# Patient Record
Sex: Female | Born: 1975
Health system: Southern US, Community
[De-identification: ages and names within clinical notes are randomized; demographics above are authoritative.]

## PROBLEM LIST (undated history)

## (undated) ENCOUNTER — Inpatient Hospital Stay (HOSPITAL_COMMUNITY): Payer: Self-pay

## (undated) ENCOUNTER — Ambulatory Visit (HOSPITAL_COMMUNITY): Discharge status: Absent without leave | Payer: Medicaid Other

## (undated) DIAGNOSIS — F419 Anxiety disorder, unspecified: Secondary | ICD-10-CM

## (undated) DIAGNOSIS — R51 Headache: Secondary | ICD-10-CM

## (undated) DIAGNOSIS — R011 Cardiac murmur, unspecified: Secondary | ICD-10-CM

## (undated) DIAGNOSIS — D649 Anemia, unspecified: Secondary | ICD-10-CM

## (undated) DIAGNOSIS — F329 Major depressive disorder, single episode, unspecified: Secondary | ICD-10-CM

## (undated) DIAGNOSIS — F32A Depression, unspecified: Secondary | ICD-10-CM

## (undated) HISTORY — PX: REDUCTION MAMMAPLASTY: SUR839

---

## 2011-02-25 ENCOUNTER — Other Ambulatory Visit (HOSPITAL_COMMUNITY)
Admission: RE | Admit: 2011-02-25 | Discharge: 2011-02-25 | Disposition: A | Discharge status: Home / Self Care | Payer: BC Managed Care – PPO | Source: Ambulatory Visit | Attending: Obstetrics and Gynecology | Admitting: Obstetrics and Gynecology

## 2011-02-25 DIAGNOSIS — Z113 Encounter for screening for infections with a predominantly sexual mode of transmission: Secondary | ICD-10-CM | POA: Insufficient documentation

## 2011-02-25 DIAGNOSIS — Z124 Encounter for screening for malignant neoplasm of cervix: Secondary | ICD-10-CM | POA: Insufficient documentation

## 2011-02-25 LAB — GC/CHLAMYDIA PROBE AMP, GENITAL
Chlamydia: NEGATIVE
Gonorrhea: NEGATIVE

## 2011-02-25 LAB — HEPATITIS B SURFACE ANTIGEN: Hepatitis B Surface Ag: NEGATIVE

## 2011-02-25 LAB — ANTIBODY SCREEN: Antibody Screen: NEGATIVE

## 2011-07-07 ENCOUNTER — Encounter (HOSPITAL_COMMUNITY): Payer: Self-pay

## 2011-07-18 ENCOUNTER — Encounter (HOSPITAL_COMMUNITY)
Admission: RE | Admit: 2011-07-18 | Discharge: 2011-07-18 | Disposition: A | Discharge status: Home / Self Care | Payer: BC Managed Care – PPO | Source: Ambulatory Visit | Attending: Obstetrics and Gynecology | Admitting: Obstetrics and Gynecology

## 2011-07-18 ENCOUNTER — Inpatient Hospital Stay (HOSPITAL_COMMUNITY): Admission: RE | Admit: 2011-07-18 | Payer: BC Managed Care – PPO | Source: Ambulatory Visit

## 2011-07-18 ENCOUNTER — Encounter (HOSPITAL_COMMUNITY): Payer: Self-pay

## 2011-07-18 HISTORY — DX: Depression, unspecified: F32.A

## 2011-07-18 HISTORY — DX: Major depressive disorder, single episode, unspecified: F32.9

## 2011-07-18 HISTORY — DX: Anemia, unspecified: D64.9

## 2011-07-18 HISTORY — DX: Cardiac murmur, unspecified: R01.1

## 2011-07-18 HISTORY — DX: Headache: R51

## 2011-07-18 HISTORY — DX: Anxiety disorder, unspecified: F41.9

## 2011-07-18 LAB — CBC
HCT: 31.5 % — ABNORMAL LOW (ref 36.0–46.0)
Hemoglobin: 10.4 g/dL — ABNORMAL LOW (ref 12.0–15.0)
MCH: 28.6 pg (ref 26.0–34.0)
MCHC: 33 g/dL (ref 30.0–36.0)
MCV: 86.5 fL (ref 78.0–100.0)

## 2011-07-18 LAB — RPR: RPR Ser Ql: NONREACTIVE

## 2011-07-18 LAB — SURGICAL PCR SCREEN: MRSA, PCR: NEGATIVE

## 2011-07-18 NOTE — Patient Instructions (Signed)
YOUR PROCEDURE IS SCHEDULED ON:07/24/11  ENTER THROUGH THE MAIN ENTRANCE OF [facility] AT:1130am  USE DESK PHONE AND DIAL [phone] TO INFORM us OF YOUR ARRIVAL  CALL [phone] IF YOU HAVE ANY QUESTIONS OR PROBLEMS PRIOR TO YOUR ARRIVAL.  REMEMBER: DO NOT EAT AFTER MIDNIGHT : Wed SPECIAL INSTRUCTIONS:clear liquids ok until 9am on Thursday   YOU MAY BRUSH YOUR TEETH THE MORNING OF SURGERY   TAKE THESE MEDICINES THE DAY OF SURGERY WITH SIP OF WATER:   DO NOT WEAR JEWELRY, EYE MAKEUP, LIPSTICK OR DARK FINGERNAIL POLISH DO NOT WEAR LOTIONS  DO NOT SHAVE FOR 48 HOURS PRIOR TO SURGERY  YOU WILL NOT BE ALLOWED TO DRIVE YOURSELF HOME.  NAME OF DRIVER:George- spouse- [phone]

## 2011-07-23 ENCOUNTER — Other Ambulatory Visit (HOSPITAL_COMMUNITY): Payer: Self-pay | Admitting: Obstetrics and Gynecology

## 2011-07-24 ENCOUNTER — Inpatient Hospital Stay (HOSPITAL_COMMUNITY): Payer: BC Managed Care – PPO | Admitting: Anesthesiology

## 2011-07-24 ENCOUNTER — Encounter (HOSPITAL_COMMUNITY): Payer: Self-pay | Admitting: *Deleted

## 2011-07-24 ENCOUNTER — Inpatient Hospital Stay (HOSPITAL_COMMUNITY)
Admission: RE | Admit: 2011-07-24 | Discharge: 2011-07-26 | DRG: 371 | Disposition: A | Discharge status: Home / Self Care | Payer: BC Managed Care – PPO | Source: Ambulatory Visit | Attending: Obstetrics and Gynecology | Admitting: Obstetrics and Gynecology

## 2011-07-24 ENCOUNTER — Encounter (HOSPITAL_COMMUNITY): Payer: Self-pay | Admitting: Anesthesiology

## 2011-07-24 ENCOUNTER — Encounter (HOSPITAL_COMMUNITY)
Admission: RE | Disposition: A | Discharge status: Home / Self Care | Payer: Self-pay | Source: Ambulatory Visit | Attending: Obstetrics and Gynecology

## 2011-07-24 ENCOUNTER — Other Ambulatory Visit (HOSPITAL_COMMUNITY): Payer: Self-pay | Admitting: Obstetrics and Gynecology

## 2011-07-24 ENCOUNTER — Other Ambulatory Visit: Payer: Self-pay | Admitting: Obstetrics and Gynecology

## 2011-07-24 DIAGNOSIS — Z01812 Encounter for preprocedural laboratory examination: Secondary | ICD-10-CM

## 2011-07-24 DIAGNOSIS — O34219 Maternal care for unspecified type scar from previous cesarean delivery: Secondary | ICD-10-CM

## 2011-07-24 DIAGNOSIS — Z01818 Encounter for other preprocedural examination: Secondary | ICD-10-CM

## 2011-07-24 DIAGNOSIS — Z98891 History of uterine scar from previous surgery: Secondary | ICD-10-CM

## 2011-07-24 LAB — TYPE AND SCREEN

## 2011-07-24 SURGERY — Surgical Case
Anesthesia: Epidural | Site: Abdomen | Wound class: Clean Contaminated

## 2011-07-24 MED ORDER — ACETAMINOPHEN 10 MG/ML IV SOLN: 1000.0000 mg | Freq: Four times a day (QID) | INTRAVENOUS | Status: AC | PRN

## 2011-07-24 MED ORDER — LACTATED RINGERS IV SOLN
INTRAVENOUS | Status: DC
  Administered 2011-07-24 (×2): via INTRAVENOUS
  Administered 2011-07-24: 1000 mL via INTRAVENOUS

## 2011-07-24 MED ORDER — ONDANSETRON HCL 4 MG/2ML IJ SOLN: 4.0000 mg | INTRAMUSCULAR | Status: DC | PRN

## 2011-07-24 MED ORDER — DIPHENHYDRAMINE HCL 50 MG/ML IJ SOLN: 25.0000 mg | INTRAMUSCULAR | Status: DC | PRN

## 2011-07-24 MED ORDER — METOCLOPRAMIDE HCL 5 MG/ML IJ SOLN: 10.0000 mg | Freq: Three times a day (TID) | INTRAMUSCULAR | Status: DC | PRN

## 2011-07-24 MED ORDER — MORPHINE SULFATE (PF) 0.5 MG/ML IJ SOLN
INTRAMUSCULAR | Status: DC | PRN
  Administered 2011-07-24: 100 ug via INTRATHECAL
  Administered 2011-07-24: 2000 ug via INTRAVENOUS
  Administered 2011-07-24: 2900 ug via EPIDURAL

## 2011-07-24 MED ORDER — MORPHINE SULFATE 0.5 MG/ML IJ SOLN
INTRAMUSCULAR | Status: AC
  Filled 2011-07-24: qty 10

## 2011-07-24 MED ORDER — DIBUCAINE 1 % RE OINT: 1.0000 "application " | TOPICAL_OINTMENT | RECTAL | Status: DC | PRN

## 2011-07-24 MED ORDER — FENTANYL CITRATE 0.05 MG/ML IJ SOLN
INTRAMUSCULAR | Status: AC
  Filled 2011-07-24: qty 2

## 2011-07-24 MED ORDER — LACTATED RINGERS IV SOLN
INTRAVENOUS | Status: DC
  Administered 2011-07-25: 01:00:00 via INTRAVENOUS

## 2011-07-24 MED ORDER — SODIUM CHLORIDE 0.9 % IJ SOLN: 3.0000 mL | INTRAMUSCULAR | Status: DC | PRN

## 2011-07-24 MED ORDER — DIPHENHYDRAMINE HCL 25 MG PO CAPS: 25.0000 mg | ORAL_CAPSULE | Freq: Four times a day (QID) | ORAL | Status: DC | PRN

## 2011-07-24 MED ORDER — BUPIVACAINE IN DEXTROSE 0.75-8.25 % IT SOLN
INTRATHECAL | Status: DC | PRN
  Administered 2011-07-24: 12 mg via INTRATHECAL

## 2011-07-24 MED ORDER — MEPERIDINE HCL 25 MG/ML IJ SOLN: 6.2500 mg | INTRAMUSCULAR | Status: DC | PRN

## 2011-07-24 MED ORDER — SIMETHICONE 80 MG PO CHEW
80.0000 mg | CHEWABLE_TABLET | Freq: Three times a day (TID) | ORAL | Status: DC
  Administered 2011-07-24 – 2011-07-26 (×6): 80 mg via ORAL

## 2011-07-24 MED ORDER — KETOROLAC TROMETHAMINE 30 MG/ML IJ SOLN
INTRAMUSCULAR | Status: AC
  Administered 2011-07-24: 30 mg via INTRAVENOUS
  Filled 2011-07-24: qty 1

## 2011-07-24 MED ORDER — MEASLES, MUMPS & RUBELLA VAC ~~LOC~~ INJ: 0.5000 mL | INJECTION | Freq: Once | SUBCUTANEOUS | Status: DC

## 2011-07-24 MED ORDER — FENTANYL CITRATE 0.05 MG/ML IJ SOLN
INTRAMUSCULAR | Status: DC | PRN
  Administered 2011-07-24: 100 ug via INTRAVENOUS
  Administered 2011-07-24: 75 ug via INTRAVENOUS
  Administered 2011-07-24: 25 ug via INTRATHECAL

## 2011-07-24 MED ORDER — ONDANSETRON HCL 4 MG/2ML IJ SOLN
INTRAMUSCULAR | Status: AC
  Filled 2011-07-24: qty 2

## 2011-07-24 MED ORDER — MENTHOL 3 MG MT LOZG: 1.0000 | LOZENGE | OROMUCOSAL | Status: DC | PRN

## 2011-07-24 MED ORDER — DIPHENHYDRAMINE HCL 25 MG PO CAPS: 25.0000 mg | ORAL_CAPSULE | ORAL | Status: DC | PRN

## 2011-07-24 MED ORDER — KETOROLAC TROMETHAMINE 60 MG/2ML IM SOLN
INTRAMUSCULAR | Status: AC
  Filled 2011-07-24: qty 2

## 2011-07-24 MED ORDER — IBUPROFEN 600 MG PO TABS
600.0000 mg | ORAL_TABLET | Freq: Four times a day (QID) | ORAL | Status: DC
  Administered 2011-07-25 – 2011-07-26 (×4): 600 mg via ORAL
  Filled 2011-07-24 (×2): qty 1

## 2011-07-24 MED ORDER — PROMETHAZINE HCL 25 MG/ML IJ SOLN: 6.2500 mg | INTRAMUSCULAR | Status: DC | PRN

## 2011-07-24 MED ORDER — ONDANSETRON HCL 4 MG PO TABS: 4.0000 mg | ORAL_TABLET | ORAL | Status: DC | PRN

## 2011-07-24 MED ORDER — HYDROMORPHONE HCL PF 1 MG/ML IJ SOLN
INTRAMUSCULAR | Status: AC
  Filled 2011-07-24: qty 1

## 2011-07-24 MED ORDER — SCOPOLAMINE 1 MG/3DAYS TD PT72
1.0000 | MEDICATED_PATCH | Freq: Once | TRANSDERMAL | Status: DC
  Administered 2011-07-24: 1.5 mg via TRANSDERMAL

## 2011-07-24 MED ORDER — NALBUPHINE HCL 10 MG/ML IJ SOLN: 5.0000 mg | INTRAMUSCULAR | Status: DC | PRN

## 2011-07-24 MED ORDER — OXYTOCIN 20 UNITS IN LACTATED RINGERS INFUSION - SIMPLE
125.0000 mL/h | INTRAVENOUS | Status: AC
  Administered 2011-07-24: 125 mL/h via INTRAVENOUS

## 2011-07-24 MED ORDER — DIPHENHYDRAMINE HCL 50 MG/ML IJ SOLN: 12.5000 mg | INTRAMUSCULAR | Status: DC | PRN

## 2011-07-24 MED ORDER — OXYTOCIN 20 UNITS IN LACTATED RINGERS INFUSION - SIMPLE
INTRAVENOUS | Status: DC | PRN
  Administered 2011-07-24: 20 [IU] via INTRAVENOUS

## 2011-07-24 MED ORDER — HYDROMORPHONE HCL PF 1 MG/ML IJ SOLN
INTRAMUSCULAR | Status: DC | PRN
  Administered 2011-07-24 (×2): 1 mg via INTRAVENOUS

## 2011-07-24 MED ORDER — HEMOSTATIC AGENTS (NO CHARGE) OPTIME
TOPICAL | Status: DC | PRN
  Administered 2011-07-24: 1 via TOPICAL

## 2011-07-24 MED ORDER — CHLOROPROCAINE HCL 3 % IJ SOLN
INTRAMUSCULAR | Status: AC
  Filled 2011-07-24: qty 20

## 2011-07-24 MED ORDER — IBUPROFEN 600 MG PO TABS
600.0000 mg | ORAL_TABLET | Freq: Four times a day (QID) | ORAL | Status: DC | PRN
  Administered 2011-07-24 – 2011-07-25 (×3): 600 mg via ORAL
  Filled 2011-07-24 (×5): qty 1

## 2011-07-24 MED ORDER — EPHEDRINE SULFATE 50 MG/ML IJ SOLN
INTRAMUSCULAR | Status: DC | PRN
  Administered 2011-07-24 (×2): 15 mg via INTRAVENOUS

## 2011-07-24 MED ORDER — BUPIVACAINE HCL (PF) 0.25 % IJ SOLN
INTRAMUSCULAR | Status: DC | PRN
  Administered 2011-07-24: 16 mL

## 2011-07-24 MED ORDER — BUPIVACAINE ON-Q PAIN PUMP (FOR ORDER SET NO CHG)
INJECTION | Status: DC
  Filled 2011-07-24: qty 1

## 2011-07-24 MED ORDER — ONDANSETRON HCL 4 MG/2ML IJ SOLN: 4.0000 mg | Freq: Three times a day (TID) | INTRAMUSCULAR | Status: DC | PRN

## 2011-07-24 MED ORDER — SCOPOLAMINE 1 MG/3DAYS TD PT72: 1.0000 | MEDICATED_PATCH | Freq: Once | TRANSDERMAL | Status: DC

## 2011-07-24 MED ORDER — PRENATAL MULTIVITAMIN CH
1.0000 | ORAL_TABLET | Freq: Every day | ORAL | Status: DC
  Administered 2011-07-25 – 2011-07-26 (×2): 1 via ORAL
  Filled 2011-07-24 (×2): qty 1

## 2011-07-24 MED ORDER — OXYTOCIN 10 UNIT/ML IJ SOLN
INTRAMUSCULAR | Status: AC
  Filled 2011-07-24: qty 2

## 2011-07-24 MED ORDER — LANOLIN HYDROUS EX OINT: 1.0000 "application " | TOPICAL_OINTMENT | CUTANEOUS | Status: DC | PRN

## 2011-07-24 MED ORDER — FENTANYL CITRATE 0.05 MG/ML IJ SOLN: 25.0000 ug | INTRAMUSCULAR | Status: DC | PRN

## 2011-07-24 MED ORDER — ONDANSETRON HCL 4 MG/2ML IJ SOLN
INTRAMUSCULAR | Status: DC | PRN
  Administered 2011-07-24: 4 mg via INTRAVENOUS

## 2011-07-24 MED ORDER — KETOROLAC TROMETHAMINE 30 MG/ML IJ SOLN
30.0000 mg | Freq: Four times a day (QID) | INTRAMUSCULAR | Status: AC | PRN
  Administered 2011-07-24: 30 mg via INTRAVENOUS

## 2011-07-24 MED ORDER — ACETAMINOPHEN 325 MG PO TABS: 325.0000 mg | ORAL_TABLET | ORAL | Status: DC | PRN

## 2011-07-24 MED ORDER — NALOXONE HCL 0.4 MG/ML IJ SOLN: 1.0000 ug/kg/h | INTRAMUSCULAR | Status: DC | PRN

## 2011-07-24 MED ORDER — LACTATED RINGERS IV SOLN
INTRAVENOUS | Status: DC | PRN
  Administered 2011-07-24: 13:00:00 via INTRAVENOUS

## 2011-07-24 MED ORDER — MEDROXYPROGESTERONE ACETATE 150 MG/ML IM SUSP: 150.0000 mg | INTRAMUSCULAR | Status: DC | PRN

## 2011-07-24 MED ORDER — NALOXONE HCL 0.4 MG/ML IJ SOLN: 0.4000 mg | INTRAMUSCULAR | Status: DC | PRN

## 2011-07-24 MED ORDER — SIMETHICONE 80 MG PO CHEW: 80.0000 mg | CHEWABLE_TABLET | ORAL | Status: DC | PRN

## 2011-07-24 MED ORDER — CEFAZOLIN SODIUM 1-5 GM-% IV SOLN
1.0000 g | INTRAVENOUS | Status: AC
  Administered 2011-07-24: 1 g via INTRAVENOUS

## 2011-07-24 MED ORDER — TETANUS-DIPHTH-ACELL PERTUSSIS 5-2.5-18.5 LF-MCG/0.5 IM SUSP: 0.5000 mL | Freq: Once | INTRAMUSCULAR | Status: DC

## 2011-07-24 MED ORDER — ZOLPIDEM TARTRATE 5 MG PO TABS: 5.0000 mg | ORAL_TABLET | Freq: Every evening | ORAL | Status: DC | PRN

## 2011-07-24 MED ORDER — KETOROLAC TROMETHAMINE 30 MG/ML IJ SOLN: 30.0000 mg | Freq: Four times a day (QID) | INTRAMUSCULAR | Status: AC | PRN

## 2011-07-24 MED ORDER — SENNOSIDES-DOCUSATE SODIUM 8.6-50 MG PO TABS
2.0000 | ORAL_TABLET | Freq: Every day | ORAL | Status: DC
  Administered 2011-07-24 – 2011-07-25 (×2): 2 via ORAL

## 2011-07-24 MED ORDER — OXYCODONE-ACETAMINOPHEN 5-325 MG PO TABS
1.0000 | ORAL_TABLET | ORAL | Status: DC | PRN
  Administered 2011-07-25 – 2011-07-26 (×7): 2 via ORAL
  Filled 2011-07-24 (×7): qty 2

## 2011-07-24 MED ORDER — PHENYLEPHRINE HCL 10 MG/ML IJ SOLN
INTRAMUSCULAR | Status: DC | PRN
  Administered 2011-07-24: 120 ug via INTRAVENOUS

## 2011-07-24 MED ORDER — OXYTOCIN 20 UNITS IN LACTATED RINGERS INFUSION - SIMPLE
INTRAVENOUS | Status: AC
  Administered 2011-07-24: 125 mL/h via INTRAVENOUS
  Filled 2011-07-24: qty 1000

## 2011-07-24 MED ORDER — WITCH HAZEL-GLYCERIN EX PADS: 1.0000 "application " | MEDICATED_PAD | CUTANEOUS | Status: DC | PRN

## 2011-07-24 SURGICAL SUPPLY — 41 items
BENZOIN TINCTURE PRP APPL 2/3 (GAUZE/BANDAGES/DRESSINGS) ×2 IMPLANT
CLOSURE STERI STRIP 1/2 X4 (GAUZE/BANDAGES/DRESSINGS) ×2 IMPLANT
CLOTH BEACON ORANGE TIMEOUT ST (SAFETY) ×2 IMPLANT
DERMABOND ADVANCED (GAUZE/BANDAGES/DRESSINGS) ×1
DERMABOND ADVANCED .7 DNX12 (GAUZE/BANDAGES/DRESSINGS) ×1 IMPLANT
DRAIN JACKSON PRT FLT 10 (DRAIN) IMPLANT
DRESSING TELFA 8X3 (GAUZE/BANDAGES/DRESSINGS) ×2 IMPLANT
DURAPREP 26ML APPLICATOR (WOUND CARE) IMPLANT
ELECT REM PT RETURN 9FT ADLT (ELECTROSURGICAL) ×2
ELECTRODE REM PT RTRN 9FT ADLT (ELECTROSURGICAL) ×1 IMPLANT
EVACUATOR SILICONE 100CC (DRAIN) IMPLANT
EXTRACTOR VACUUM M CUP 4 TUBE (SUCTIONS) ×6 IMPLANT
GAUZE SPONGE 4X4 12PLY STRL LF (GAUZE/BANDAGES/DRESSINGS) ×2 IMPLANT
GLOVE BIO SURGEON STRL SZ 6.5 (GLOVE) ×2 IMPLANT
GLOVE BIOGEL PI IND STRL 7.0 (GLOVE) ×1 IMPLANT
GLOVE BIOGEL PI INDICATOR 7.0 (GLOVE) ×1
GOWN PREVENTION PLUS LG XLONG (DISPOSABLE) ×4 IMPLANT
GOWN PREVENTION PLUS XLARGE (GOWN DISPOSABLE) ×2 IMPLANT
KIT ABG SYR 3ML LUER SLIP (SYRINGE) IMPLANT
NEEDLE HYPO 25X5/8 SAFETYGLIDE (NEEDLE) IMPLANT
NS IRRIG 1000ML POUR BTL (IV SOLUTION) ×2 IMPLANT
PACK C SECTION WH (CUSTOM PROCEDURE TRAY) ×2 IMPLANT
PAD ABD 7.5X8 STRL (GAUZE/BANDAGES/DRESSINGS) ×2 IMPLANT
RTRCTR C-SECT PINK 25CM LRG (MISCELLANEOUS) IMPLANT
SLEEVE SCD COMPRESS KNEE MED (MISCELLANEOUS) IMPLANT
SPONGE LAP 18X18 X RAY DECT (DISPOSABLE) ×4 IMPLANT
STRIP CLOSURE SKIN 1/2X4 (GAUZE/BANDAGES/DRESSINGS) IMPLANT
SUT CHROMIC 2 0 CT 1 (SUTURE) ×4 IMPLANT
SUT PLAIN 2 0 (SUTURE)
SUT PLAIN 2 0 XLH (SUTURE) ×2 IMPLANT
SUT PLAIN ABS 2-0 54XMFL TIE (SUTURE) IMPLANT
SUT VIC AB 0 CT1 36 (SUTURE) ×14 IMPLANT
SUT VIC AB 0 CTX 36 (SUTURE) ×1
SUT VIC AB 0 CTX36XBRD ANBCTRL (SUTURE) ×1 IMPLANT
SUT VIC AB 3-0 SH 27 (SUTURE) ×1
SUT VIC AB 3-0 SH 27X BRD (SUTURE) ×1 IMPLANT
SUT VIC AB 4-0 KS 27 (SUTURE) ×2 IMPLANT
SYR 20CC LL (SYRINGE) ×2 IMPLANT
TOWEL OR 17X24 6PK STRL BLUE (TOWEL DISPOSABLE) ×4 IMPLANT
TRAY FOLEY CATH 14FR (SET/KITS/TRAYS/PACK) ×2 IMPLANT
WATER STERILE IRR 1000ML POUR (IV SOLUTION) ×2 IMPLANT

## 2011-07-24 NOTE — H&P (Signed)
Gail Brewer is an 36 y.o. female G3P2 who is due date is prenatal course has been uncomplicated and the patient presents at term for repeat C-section. She is previous C-section x2. First C-section was in 2004 for fetal distress. Then in 2008 a repeat C-section was done following a failed VBAC. Risks possible complications of the procedure has been discussed and consent was given for repeat C-section..  Pertinent Gynecological History: Menses: LMP was 10/20/2010 Bleeding: na DES exposure: denies Blood transfusions: none Sexually transmitted diseases: past history: chlamydia Previous GYN Procedures: cesarean section x2  Last pap: normal Date: 02/25/2011 OB History: G3, P2   Menstrual History: Menarche age:40 Patient's last menstrual period was 10/20/2010.    Past Medical History  Diagnosis Date  . Heart murmur     asymptomatic  . Anemia   . Anxiety   . Depression   . Headache     Past Surgical History  Procedure Date  . Cesarean section     x 2    No family history on file.  Social History:  reports that she has been smoking.  She does not have any smokeless tobacco history on file. She reports that she does not drink alcohol or use illicit drugs.  Allergies: No Known Allergies    Review of Systems  Constitutional: Negative.   HENT: Negative.   Eyes: Negative.   Respiratory: Negative.   Cardiovascular: Negative.   Gastrointestinal: Negative.   Genitourinary: Negative.   Musculoskeletal: Negative.   Skin: Negative.   Neurological: Negative.   Endo/Heme/Allergies: Negative.   Psychiatric/Behavioral: Negative.     Last menstrual period 10/20/2010. Physical Exam  Constitutional: She is oriented to person, place, and time. She appears well-developed and well-nourished.  HENT:  Head: Normocephalic and atraumatic.  Eyes: Conjunctivae and EOM are normal. Right eye exhibits no discharge. Left eye exhibits no discharge.  Neck: Normal range of motion. Neck  supple. No JVD present. No tracheal deviation present. No thyromegaly present.  Cardiovascular: Normal rate, regular rhythm, normal heart sounds and intact distal pulses.  Exam reveals no gallop and no friction rub.   No murmur heard. Respiratory: Effort normal and breath sounds normal. No stridor.  GI: Soft. Bowel sounds are normal.  Genitourinary: Vagina normal. No vaginal discharge found.       Term uterus  Musculoskeletal: Normal range of motion. She exhibits no edema and no tenderness.  Lymphadenopathy:    She has no cervical adenopathy.  Neurological: She is alert and oriented to person, place, and time. She exhibits normal muscle tone. Coordination normal.  Skin: Skin is warm and dry. No rash noted. No erythema. No pallor.  Psychiatric: She has a normal mood and affect. Her behavior is normal. Judgment and thought content normal.    No results found for this or any previous visit (from the past 24 hour(s)).  No results found.  Assessment/Plan: 36 year old gravida 3 para 2 patient whose due date is 07/27/2011 for repeat C-section Plan repeat cesarean section  Mazie Fencl E 07/24/2011, 9:21 AM

## 2011-07-24 NOTE — Anesthesia Preprocedure Evaluation (Signed)
Anesthesia Evaluation  Patient identified by MRN, date of birth, ID band Patient awake    Reviewed: Allergy & Precautions, H&P , Patient's Chart, lab work & pertinent test results  Airway Mallampati: III TM Distance: >3 FB Neck ROM: full    Dental No notable dental hx.    Pulmonary neg pulmonary ROS,  clear to auscultation  Pulmonary exam normal       Cardiovascular neg cardio ROS + Valvular Problems/Murmurs regular Normal    Neuro/Psych  Headaches, Negative Neurological ROS  Negative Psych ROS   GI/Hepatic negative GI ROS, Neg liver ROS,   Endo/Other  Negative Endocrine ROSMorbid obesity  Renal/GU negative Renal ROS     Musculoskeletal   Abdominal   Peds  Hematology negative hematology ROS (+)   Anesthesia Other Findings   Reproductive/Obstetrics (+) Pregnancy                           Anesthesia Physical Anesthesia Plan  ASA: III  Anesthesia Plan: Epidural   Post-op Pain Management:    Induction:   Airway Management Planned:   Additional Equipment:   Intra-op Plan:   Post-operative Plan:   Informed Consent: I have reviewed the patients History and Physical, chart, labs and discussed the procedure including the risks, benefits and alternatives for the proposed anesthesia with the patient or authorized representative who has indicated his/her understanding and acceptance.     Plan Discussed with:   Anesthesia Plan Comments:         Anesthesia Quick Evaluation

## 2011-07-24 NOTE — Anesthesia Procedure Notes (Signed)
Spinal  Patient location during procedure: OR Start time: 07/24/2011 12:25 PM Staffing Anesthesiologist: Brayton Caves R Performed by: anesthesiologist  Preanesthetic Checklist Completed: patient identified, site marked, surgical consent, pre-op evaluation, timeout performed, IV checked, risks and benefits discussed and monitors and equipment checked Spinal Block Patient position: sitting Prep: DuraPrep Patient monitoring: heart rate, cardiac monitor, continuous pulse ox and blood pressure Approach: midline Location: L3-4 Injection technique: single-shot Needle Needle type: Sprotte  Needle gauge: 24 G Needle length: 9 cm Assessment Sensory level: T4 Additional Notes Patient identified.  Risk benefits discussed including failed block, incomplete pain control, headache, nerve damage, paralysis, blood pressure changes, nausea, vomiting, reactions to medication both toxic or allergic, and postpartum back pain.  Patient expressed understanding and wished to proceed.  All questions were answered.  Sterile technique used throughout procedure.  CSF was clear.  No parasthesia or other complications.  Please see nursing notes for vital signs.

## 2011-07-24 NOTE — Op Note (Addendum)
Cesarean Section Procedure Note   Gail Brewer   07/24/2011  Indications:Them patient is a 36 yo G3 P2 with a term pregnancy and previous CS x 2.    Pre-operative Diagnosis: Previous  Cesarean Section.   Post-operative Diagnosis: Same   Surgeon: Fortino Sic  Assistants: Dr. Emelda Fear  Anesthesia: spinal and local  Procedure Details:  The patient was seen in the Holding Room. The risks, benefits, complications, treatment options, and expected outcomes were discussed with the patient. The patient concurred with the proposed plan, giving informed consent. The patient was identified as Gail Brewer and the procedure verified as C-Section Delivery. A Time Out was held and the above information confirmed.  After induction of anesthesia, the patient was draped and prepped in the usual sterile manner. A transverse incision was made and carried down through the subcutaneous tissue to the fascia. The fascial incision was made and extended transversely. The fascia was separated from the underlying rectus tissue superiorly and inferiorly. The peritoneum was not identified.There was thick midline scaring of the uterus to the abdominal wall.  A low transverse uterine incision was made with difficulty due to thick midline adhesions.  The incision was tight so the rectus muscles were incised using cautery on the coagulation setting.  Then using the suction cup was applied and on the third attempt the fetal head was delivered from cephalic presentation.  The infant was a  7 lb, 8 oz female with Apgars of 9 and 9. A cord ph was not obtained.  The umbilical cord was clamped and cut cord. The placenta was removed Intact and appeared normal.  The uterine incision was closed with running locked sutures of 1-0 Vicryl. A second imbricating layer of the same suture was placed.  Hemostasis was observed.  Surgicel was placed beneath the rectus muscle on the rightafter hemostasis was obtained using interrupted  0 vicryl sutures  Due to extensive adhesions, there were only  two small areas where the peritoneum was entered, one 3 to 4 cm area beneath the rectus muscle and a small area about 2 cm in the lower left abdomen. So essentially an a extra faschial Cesarean section was done. The incision was irrigated and suctioned.  At this point hemostasis was excellent. Several loose sutures were placed in the superior rectus muscles to re approximate the muscle. The fascia was then reapproximated with running sutures of 0-0Vicryl. The subcuticular closure was performed using 2 0 plain catgut. The skin was approximated with 4 0 vicryl in a subcuticular fashion.   Instrument, sponge, and needle counts were correct prior the abdominal closure and were correct at the conclusion of the case.    Findings: Severe scar tissue.  For next CS consider and midline uterine incision.   Estimated Blood Loss: 1000 cc  Total IV Fluids: See anesthesia record.   Urine Output: see anesthesia record.  Specimens: @ORSPECIMEN @   Complications: no complications  Disposition: PACU - hemodynamically stable.  Maternal Condition: stable   Baby condition / location:  nursery-stable    Signed: Surgeon(s): Fortino Sic, MD Tilda Burrow, MD

## 2011-07-24 NOTE — Anesthesia Postprocedure Evaluation (Signed)
Anesthesia Post Note  Patient: Gail Brewer  Procedure(s) Performed:  CESAREAN SECTION - Repeat Cesarian Section  Anesthesia type: Spinal  Patient location: PACU  Post pain: Pain level controlled  Post assessment: Post-op Vital signs reviewed  Last Vitals:  Filed Vitals:   07/24/11 1445  BP: 106/51  Pulse: 76  Temp:   Resp: 20    Post vital signs: Reviewed  Level of consciousness: awake  Complications: No apparent anesthesia complications

## 2011-07-24 NOTE — OR Nursing (Signed)
Pt. C/o pain @ incision. Dr. Emelda Fear poured 3% nesacaine into wound topically @ sterile field @ 1315.

## 2011-07-24 NOTE — Transfer of Care (Signed)
Immediate Anesthesia Transfer of Care Note  Patient: Gail Brewer  Procedure(s) Performed:  CESAREAN SECTION - Repeat Cesarian Section  Patient Location: PACU  Anesthesia Type: Spinal  Level of Consciousness: awake, oriented and sedated  Airway & Oxygen Therapy: Patient Spontanous Breathing  Post-op Assessment: Report given to PACU RN and Post -op Vital signs reviewed and stable  Post vital signs: Reviewed and stable  Complications: No apparent anesthesia complications

## 2011-07-25 LAB — CBC
Platelets: 190 10*3/uL (ref 150–400)
RBC: 3.08 MIL/uL — ABNORMAL LOW (ref 3.87–5.11)
RDW: 15.1 % (ref 11.5–15.5)
WBC: 12.6 10*3/uL — ABNORMAL HIGH (ref 4.0–10.5)

## 2011-07-25 NOTE — Progress Notes (Signed)
Patient was referred for history of depression/anxiety. * Referral screened out by Clinical Social Worker because none of the following criteria appear to apply: ~ History of anxiety/depression during this pregnancy, or of post-partum depression. ~ Diagnosis of anxiety and/or depression within last 3 years ~ History of depression due to pregnancy loss/loss of child OR * Patient's symptoms currently being treated with medication and/or therapy.  Patient's prenatal record clearly states that the Depression was "in the past."  Please contact the Clinical Social Worker if needs arise, or if patient requests. 

## 2011-07-25 NOTE — Progress Notes (Signed)
Orthostatics not obtained on pt. Found pt. dressed in her own clothes & ambulating in room.

## 2011-07-26 MED ORDER — IBUPROFEN 600 MG PO TABS: 600.0000 mg | ORAL_TABLET | Freq: Four times a day (QID) | ORAL | Status: AC | PRN

## 2011-07-26 MED ORDER — OXYCODONE-ACETAMINOPHEN 5-325 MG PO TABS: 1.0000 | ORAL_TABLET | Freq: Four times a day (QID) | ORAL | Status: AC | PRN

## 2011-07-26 NOTE — Progress Notes (Signed)
Subjective: Postpartum Day 1: Cesarean Delivery Patient reports tolerating PO and no problems voiding.    Objective: Vital signs in last 24 hours: Temp:  [97.8 F (36.6 C)-98.1 F (36.7 C)] 97.8 F (36.6 C) (01/04 2105) Pulse Rate:  [86-93] 93  (01/04 2105) Resp:  [18-20] 18  (01/04 2105) BP: (103-114)/(66-78) 104/70 mmHg (01/04 2105)  Physical Exam:  General: alert, cooperative and no distress Lungs clear S1 and S cleard Lochia: appropriate Uterine Fundus: firm BS present.  No flatus, bowel movement yet Incision: bandage dry.  Plan removal tomorrow. DVT Evaluation: No evidence of DVT seen on physical exam.   Basename 07/25/11 0535  HGB 8.9*  HCT 26.6*    Assessment/Plan: Status post Cesarean section. Doing well postoperatively. Post op anemia   P:  Iron  Continue current care.  Gail Brewer E 07/26/2011, 4:57 AM

## 2011-07-26 NOTE — Discharge Summary (Signed)
Obstetric Discharge Summary Reason for Admission: cesarean section Prenatal Procedures: none Intrapartum Procedures: cesarean: low cervical, transverse Postpartum Procedures: none Complications-Operative and Postpartum: none Hemoglobin  Date Value Range Status  07/25/2011 8.9* 12.0-15.0 (g/dL) Final     HCT  Date Value Range Status  07/25/2011 26.6* 36.0-46.0 (%) Final    Discharge Diagnoses: Term Pregnancy-delivered  Discharge Information: Date: 07/26/2011 Activity: pelvic rest Diet: routine Medications: PNV, Ibuprofen, Iron and Percocet Condition: stable Instructions: refer to practice specific booklet Discharge to: home Follow-up Information    Follow up with Fortino Sic, MD. (pt has an appt for follow up in january )    Contact information:   [address] [place] [address] [phone]          Newborn Data: Live born female  Birth Weight: 7 lb 9.2 oz (3435 g) APGAR: 8, 9  Home with mother.  Gail Rosencrans J. 07/26/2011, 2:10 PM

## 2011-07-26 NOTE — Progress Notes (Signed)
Post Partum Day 2 Subjective: no complaints, up ad lib, voiding, tolerating PO and + flatus Desires discharge home today   Objective: Blood pressure 105/64, pulse 92, temperature 97.8 F (36.6 C), temperature source Oral, resp. rate 18, weight 108.863 kg (240 lb), last menstrual period 10/20/2010, SpO2 96.00%, unknown if currently breastfeeding.  Physical Exam:  General: alert and cooperative Lochia: appropriate Uterine Fundus: firm Incision: healing well DVT Evaluation: No evidence of DVT seen on physical exam.   Basename 07/25/11 0535  HGB 8.9*  HCT 26.6*    Assessment/Plan: Discharge home   LOS: 2 days   Akeisha Lagerquist J. 07/26/2011, 2:05 PM

## 2011-07-28 ENCOUNTER — Encounter (HOSPITAL_COMMUNITY): Payer: Self-pay | Admitting: Obstetrics and Gynecology

## 2013-03-28 ENCOUNTER — Other Ambulatory Visit: Payer: Self-pay | Admitting: Obstetrics and Gynecology

## 2013-03-28 ENCOUNTER — Other Ambulatory Visit (HOSPITAL_COMMUNITY)
Admission: RE | Admit: 2013-03-28 | Discharge: 2013-03-28 | Disposition: A | Discharge status: Home / Self Care | Payer: 59 | Source: Ambulatory Visit | Attending: Obstetrics and Gynecology | Admitting: Obstetrics and Gynecology

## 2013-03-28 DIAGNOSIS — Z1151 Encounter for screening for human papillomavirus (HPV): Secondary | ICD-10-CM | POA: Insufficient documentation

## 2013-03-28 DIAGNOSIS — Z01419 Encounter for gynecological examination (general) (routine) without abnormal findings: Secondary | ICD-10-CM | POA: Insufficient documentation

## 2013-10-06 ENCOUNTER — Encounter (HOSPITAL_COMMUNITY): Payer: Self-pay | Admitting: Emergency Medicine

## 2013-10-06 ENCOUNTER — Emergency Department (HOSPITAL_COMMUNITY)
Admission: EM | Admit: 2013-10-06 | Discharge: 2013-10-06 | Disposition: A | Discharge status: Home / Self Care | Payer: 59 | Attending: Emergency Medicine | Admitting: Emergency Medicine

## 2013-10-06 DIAGNOSIS — IMO0002 Reserved for concepts with insufficient information to code with codable children: Secondary | ICD-10-CM | POA: Insufficient documentation

## 2013-10-06 DIAGNOSIS — Z8659 Personal history of other mental and behavioral disorders: Secondary | ICD-10-CM | POA: Insufficient documentation

## 2013-10-06 DIAGNOSIS — R011 Cardiac murmur, unspecified: Secondary | ICD-10-CM | POA: Insufficient documentation

## 2013-10-06 DIAGNOSIS — M542 Cervicalgia: Secondary | ICD-10-CM

## 2013-10-06 DIAGNOSIS — S0993XA Unspecified injury of face, initial encounter: Secondary | ICD-10-CM | POA: Insufficient documentation

## 2013-10-06 DIAGNOSIS — M549 Dorsalgia, unspecified: Secondary | ICD-10-CM

## 2013-10-06 DIAGNOSIS — F17200 Nicotine dependence, unspecified, uncomplicated: Secondary | ICD-10-CM | POA: Insufficient documentation

## 2013-10-06 DIAGNOSIS — Z862 Personal history of diseases of the blood and blood-forming organs and certain disorders involving the immune mechanism: Secondary | ICD-10-CM | POA: Insufficient documentation

## 2013-10-06 DIAGNOSIS — Y92410 Unspecified street and highway as the place of occurrence of the external cause: Secondary | ICD-10-CM | POA: Insufficient documentation

## 2013-10-06 DIAGNOSIS — S199XXA Unspecified injury of neck, initial encounter: Principal | ICD-10-CM

## 2013-10-06 DIAGNOSIS — Y9389 Activity, other specified: Secondary | ICD-10-CM | POA: Insufficient documentation

## 2013-10-06 MED ORDER — CYCLOBENZAPRINE HCL 10 MG PO TABS: 10.0000 mg | ORAL_TABLET | Freq: Two times a day (BID) | ORAL | Status: DC | PRN

## 2013-10-06 MED ORDER — IBUPROFEN 800 MG PO TABS: 800.0000 mg | ORAL_TABLET | Freq: Three times a day (TID) | ORAL | Status: DC
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
## 2013-10-06 NOTE — Discharge Instructions (Signed)
Take ibuprofen as needed for pain. Take Flexeril as needed for muscle spasm. Refer to attached documents for more information.  °
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
## 2013-10-07 NOTE — ED Provider Notes (Signed)
Medical screening examination/treatment/procedure(s) were performed by non-physician practitioner and as supervising physician I was immediately available for consultation/collaboration.   Marcelline Temkin R Nneoma Harral, MD 10/07/13 0711 

## 2014-05-22 ENCOUNTER — Encounter (HOSPITAL_COMMUNITY): Payer: Self-pay | Admitting: Emergency Medicine

## 2014-09-18 ENCOUNTER — Emergency Department (HOSPITAL_COMMUNITY): Payer: No Typology Code available for payment source

## 2014-09-18 ENCOUNTER — Emergency Department (HOSPITAL_COMMUNITY)
Admission: EM | Admit: 2014-09-18 | Discharge: 2014-09-19 | Disposition: A | Discharge status: Home / Self Care | Payer: No Typology Code available for payment source | Source: Home / Self Care | Attending: Emergency Medicine | Admitting: Emergency Medicine

## 2014-09-18 ENCOUNTER — Emergency Department (HOSPITAL_COMMUNITY)
Admission: EM | Admit: 2014-09-18 | Discharge: 2014-09-18 | Discharge status: Against Medical Advice | Payer: No Typology Code available for payment source | Attending: Emergency Medicine | Admitting: Emergency Medicine

## 2014-09-18 ENCOUNTER — Encounter (HOSPITAL_COMMUNITY): Payer: Self-pay

## 2014-09-18 ENCOUNTER — Encounter (HOSPITAL_COMMUNITY): Payer: Self-pay | Admitting: *Deleted

## 2014-09-18 DIAGNOSIS — O9989 Other specified diseases and conditions complicating pregnancy, childbirth and the puerperium: Secondary | ICD-10-CM

## 2014-09-18 DIAGNOSIS — Y998 Other external cause status: Secondary | ICD-10-CM | POA: Insufficient documentation

## 2014-09-18 DIAGNOSIS — O9A211 Injury, poisoning and certain other consequences of external causes complicating pregnancy, first trimester: Secondary | ICD-10-CM | POA: Diagnosis present

## 2014-09-18 DIAGNOSIS — O209 Hemorrhage in early pregnancy, unspecified: Secondary | ICD-10-CM | POA: Insufficient documentation

## 2014-09-18 DIAGNOSIS — Z3A08 8 weeks gestation of pregnancy: Secondary | ICD-10-CM | POA: Insufficient documentation

## 2014-09-18 DIAGNOSIS — O23591 Infection of other part of genital tract in pregnancy, first trimester: Secondary | ICD-10-CM

## 2014-09-18 DIAGNOSIS — F17200 Nicotine dependence, unspecified, uncomplicated: Secondary | ICD-10-CM | POA: Insufficient documentation

## 2014-09-18 DIAGNOSIS — O99331 Smoking (tobacco) complicating pregnancy, first trimester: Secondary | ICD-10-CM | POA: Insufficient documentation

## 2014-09-18 DIAGNOSIS — O99011 Anemia complicating pregnancy, first trimester: Secondary | ICD-10-CM

## 2014-09-18 DIAGNOSIS — S199XXA Unspecified injury of neck, initial encounter: Secondary | ICD-10-CM | POA: Insufficient documentation

## 2014-09-18 DIAGNOSIS — D649 Anemia, unspecified: Secondary | ICD-10-CM | POA: Insufficient documentation

## 2014-09-18 DIAGNOSIS — Z862 Personal history of diseases of the blood and blood-forming organs and certain disorders involving the immune mechanism: Secondary | ICD-10-CM | POA: Insufficient documentation

## 2014-09-18 DIAGNOSIS — S3992XA Unspecified injury of lower back, initial encounter: Secondary | ICD-10-CM | POA: Insufficient documentation

## 2014-09-18 DIAGNOSIS — Y9389 Activity, other specified: Secondary | ICD-10-CM

## 2014-09-18 DIAGNOSIS — Z79899 Other long term (current) drug therapy: Secondary | ICD-10-CM | POA: Insufficient documentation

## 2014-09-18 DIAGNOSIS — R011 Cardiac murmur, unspecified: Secondary | ICD-10-CM

## 2014-09-18 DIAGNOSIS — F17210 Nicotine dependence, cigarettes, uncomplicated: Secondary | ICD-10-CM | POA: Diagnosis not present

## 2014-09-18 DIAGNOSIS — Z3490 Encounter for supervision of normal pregnancy, unspecified, unspecified trimester: Secondary | ICD-10-CM

## 2014-09-18 DIAGNOSIS — N760 Acute vaginitis: Principal | ICD-10-CM

## 2014-09-18 DIAGNOSIS — Z8659 Personal history of other mental and behavioral disorders: Secondary | ICD-10-CM | POA: Insufficient documentation

## 2014-09-18 DIAGNOSIS — Z3A01 Less than 8 weeks gestation of pregnancy: Secondary | ICD-10-CM | POA: Insufficient documentation

## 2014-09-18 DIAGNOSIS — Y92410 Unspecified street and highway as the place of occurrence of the external cause: Secondary | ICD-10-CM

## 2014-09-18 DIAGNOSIS — N939 Abnormal uterine and vaginal bleeding, unspecified: Secondary | ICD-10-CM

## 2014-09-18 DIAGNOSIS — B9689 Other specified bacterial agents as the cause of diseases classified elsewhere: Secondary | ICD-10-CM

## 2014-09-18 LAB — ABO/RH: ABO/RH(D): O POS

## 2014-09-18 LAB — I-STAT BETA HCG BLOOD, ED (MC, WL, AP ONLY): I-stat hCG, quantitative: >2000 m[IU]/mL — ABNORMAL HIGH (ref <5)

## 2014-09-18 LAB — URINALYSIS, ROUTINE W REFLEX MICROSCOPIC
Bilirubin Urine: NEGATIVE
Glucose, UA: NEGATIVE mg/dL
Hgb urine dipstick: NEGATIVE
KETONES UR: NEGATIVE mg/dL
Leukocytes, UA: NEGATIVE
NITRITE: NEGATIVE
PROTEIN: NEGATIVE mg/dL
Specific Gravity, Urine: 1.026 (ref 1.005–1.030)
UROBILINOGEN UA: 0.2 mg/dL (ref 0.0–1.0)
pH: 6 (ref 5.0–8.0)

## 2014-09-18 LAB — I-STAT CHEM 8, ED
BUN: 9 mg/dL (ref 6–23)
CALCIUM ION: 1.23 mmol/L (ref 1.12–1.23)
CREATININE: 0.6 mg/dL (ref 0.50–1.10)
Chloride: 103 mmol/L (ref 96–112)
GLUCOSE: 111 mg/dL — AB (ref 70–99)
HCT: 40 % (ref 36.0–46.0)
HEMOGLOBIN: 13.6 g/dL (ref 12.0–15.0)
Potassium: 3.6 mmol/L (ref 3.5–5.1)
Sodium: 136 mmol/L (ref 135–145)
TCO2: 19 mmol/L (ref 0–100)

## 2014-09-18 LAB — WET PREP, GENITAL
TRICH WET PREP: NONE SEEN
Yeast Wet Prep HPF POC: NONE SEEN

## 2014-09-18 LAB — POC URINE PREG, ED: PREG TEST UR: POSITIVE — AB

## 2014-09-18 LAB — HCG, QUANTITATIVE, PREGNANCY: HCG, BETA CHAIN, QUANT, S: 95273 m[IU]/mL — AB (ref <5)

## 2014-09-18 MED ORDER — ACETAMINOPHEN 325 MG PO TABS
650.0000 mg | ORAL_TABLET | Freq: Once | ORAL | Status: AC
  Administered 2014-09-18: 650 mg via ORAL
  Filled 2014-09-18: qty 2

## 2014-09-18 MED ORDER — CYCLOBENZAPRINE HCL 10 MG PO TABS: 5.0000 mg | ORAL_TABLET | Freq: Once | ORAL | Status: DC

## 2014-09-18 MED ORDER — CYCLOBENZAPRINE HCL 10 MG PO TABS
5.0000 mg | ORAL_TABLET | Freq: Once | ORAL | Status: AC
  Administered 2014-09-18: 5 mg via ORAL
  Filled 2014-09-18: qty 1

## 2014-09-18 MED ORDER — ACETAMINOPHEN 325 MG PO TABS: 650.0000 mg | ORAL_TABLET | Freq: Once | ORAL | Status: DC
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
## 2014-09-18 NOTE — ED Notes (Addendum)
Pt reports she has to leave to left her son in the house. Pt states "I just need 15 minutes and I''ll be back". Pt was informed that if she leaves our property she will have to sign out AMA. PT states "well I can't leave him".   PA Tiffany made aware.
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
## 2014-09-18 NOTE — ED Notes (Signed)
Patient asking if she can have something for pain because she has been here since 12 today. I told patient that we would not be able to give her any pain medicine until a doctor has seen her. Explained that because she left and came back that we have to start the entire process over again.
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
## 2014-09-19 MED ORDER — METRONIDAZOLE 500 MG PO TABS
500.0000 mg | ORAL_TABLET | Freq: Once | ORAL | Status: AC
  Administered 2014-09-19: 500 mg via ORAL
  Filled 2014-09-19: qty 1

## 2014-09-19 MED ORDER — CYCLOBENZAPRINE HCL 10 MG PO TABS: 5.0000 mg | ORAL_TABLET | Freq: Two times a day (BID) | ORAL | Status: DC | PRN

## 2014-09-19 MED ORDER — METRONIDAZOLE 500 MG PO TABS: 500.0000 mg | ORAL_TABLET | Freq: Two times a day (BID) | ORAL | Status: DC

## 2014-09-19 MED ORDER — HYDROCODONE-ACETAMINOPHEN 5-325 MG PO TABS: 2.0000 | ORAL_TABLET | ORAL | Status: DC | PRN
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
## 2014-09-20 LAB — GC/CHLAMYDIA PROBE AMP (~~LOC~~) NOT AT ARMC
CHLAMYDIA, DNA PROBE: NEGATIVE
NEISSERIA GONORRHEA: NEGATIVE

## 2014-10-20 ENCOUNTER — Inpatient Hospital Stay (HOSPITAL_COMMUNITY)
Admission: AD | Admit: 2014-10-20 | Discharge: 2014-10-20 | Disposition: A | Discharge status: Home / Self Care | Payer: Medicaid Other | Source: Ambulatory Visit | Attending: Obstetrics & Gynecology | Admitting: Obstetrics & Gynecology

## 2014-10-20 ENCOUNTER — Inpatient Hospital Stay (HOSPITAL_COMMUNITY): Payer: Medicaid Other

## 2014-10-20 ENCOUNTER — Encounter (HOSPITAL_COMMUNITY): Payer: Self-pay | Admitting: *Deleted

## 2014-10-20 DIAGNOSIS — F17210 Nicotine dependence, cigarettes, uncomplicated: Secondary | ICD-10-CM | POA: Insufficient documentation

## 2014-10-20 DIAGNOSIS — O209 Hemorrhage in early pregnancy, unspecified: Secondary | ICD-10-CM

## 2014-10-20 DIAGNOSIS — O039 Complete or unspecified spontaneous abortion without complication: Secondary | ICD-10-CM

## 2014-10-20 LAB — CBC
HEMATOCRIT: 33.5 % — AB (ref 36.0–46.0)
Hemoglobin: 11.2 g/dL — ABNORMAL LOW (ref 12.0–15.0)
MCH: 29.3 pg (ref 26.0–34.0)
MCHC: 33.4 g/dL (ref 30.0–36.0)
MCV: 87.7 fL (ref 78.0–100.0)
Platelets: 270 10*3/uL (ref 150–400)
RBC: 3.82 MIL/uL — ABNORMAL LOW (ref 3.87–5.11)
RDW: 12.9 % (ref 11.5–15.5)
WBC: 11.7 10*3/uL — ABNORMAL HIGH (ref 4.0–10.5)

## 2014-10-20 LAB — HCG, QUANTITATIVE, PREGNANCY: hCG, Beta Chain, Quant, S: 4102 m[IU]/mL — ABNORMAL HIGH (ref <5)

## 2014-10-20 LAB — POCT PREGNANCY, URINE: Preg Test, Ur: POSITIVE — AB

## 2014-10-20 MED ORDER — IBUPROFEN 600 MG PO TABS: 600.0000 mg | ORAL_TABLET | Freq: Four times a day (QID) | ORAL | Status: DC | PRN

## 2014-10-20 MED ORDER — OXYCODONE-ACETAMINOPHEN 5-325 MG PO TABS: 1.0000 | ORAL_TABLET | Freq: Four times a day (QID) | ORAL | Status: DC | PRN

## 2014-10-20 MED ORDER — HYDROMORPHONE HCL 1 MG/ML IJ SOLN
1.0000 mg | INTRAMUSCULAR | Status: AC
  Administered 2014-10-20: 1 mg via INTRAMUSCULAR
  Filled 2014-10-20: qty 1

## 2014-10-20 NOTE — MAU Provider Note (Signed)
Chief Complaint: Vaginal Bleeding and Miscarriage   First Provider Initiated Contact with Patient 10/20/14 1310      SUBJECTIVE HPI: Gail Brewer is a 39 y.o. [id number] at 1748w5d by LMP who presents to maternity admissions reporting miscarriage in process. [place] and was told there was no heartbeat on fetal ultrasound.  [facility] and [facility].  Today, she reports onset of severe cramping and vaginal bleeding, and she passed what appeared to be an 11 week fetus.  Her bleeding continued, however, and pain became worse, so she came to MAU for evaluation. She denies vaginal bleeding, vaginal itching/burning, urinary symptoms, h/a, dizziness, n/v, or fever/chills.    Vaginal Bleeding This is a new problem. The current episode started today. The problem occurs constantly. The problem has been gradually worsening. The pain is severe. The problem affects both sides. Associated symptoms include abdominal pain. Pertinent negatives include no chills or fever. Nothing aggravates the symptoms. She has tried nothing for the symptoms.  Abdominal Pain This is a new problem. The current episode started today. The onset quality is sudden. The problem occurs constantly. The problem has been gradually worsening. The pain is located in the generalized abdominal region. The pain is at a severity of 9/10. The pain is severe. The quality of the pain is cramping. The abdominal pain radiates to the back and pelvis. Pertinent negatives include no fever. Nothing aggravates the pain. The pain is relieved by nothing.    Past Medical History  Diagnosis Date  . Heart murmur     asymptomatic  . Anemia   . Anxiety   . Depression   . ZOXWRUEA(540.9Headache(784.0)    Past Surgical History  Procedure Laterality Date  . Cesarean section      x 2  . Cesarean section  07/24/2011    Procedure: CESAREAN SECTION;  Surgeon: Fortino SicEleanor E  Greene, MD;  Location: [facility];  Service: Gynecology;  Laterality: N/A;  Repeat Cesarian Section   History   Social History  . Marital Status: Married    Spouse Name: N/A  . Number of Children: N/A  . Years of Education: N/A   Occupational History  . Not on file.   Social History Main Topics  . Smoking status: Current Some Day Smoker  . Smokeless tobacco: Not on file  . Alcohol Use: No  . Drug Use: No  . Sexual Activity: Not on file   Other Topics Concern  . Not on file   Social History Narrative   No current facility-administered medications on file prior to encounter.   Current Outpatient Prescriptions on File Prior to Encounter  Medication Sig Dispense Refill  . Prenatal Vit-Fe Fumarate-FA (PRENATAL PO) Take 1 tablet by mouth daily.     No Known Allergies  Review of Systems  Constitutional: Negative for fever and chills.  Respiratory: Negative for shortness of breath.   Gastrointestinal: Positive for abdominal pain.  Genitourinary: Positive for vaginal bleeding.  Neurological: Negative for dizziness.    OBJECTIVE Blood pressure 121/66, pulse 78, temperature 97.7 F (36.5 C), resp. rate 18, last menstrual period 07/30/2014. GENERAL: Well-developed, well-nourished female in no acute distress.  HEENT: Normocephalic HEART: normal rate RESP: normal effort ABDOMEN: Soft, non-tender EXTREMITIES: Nontender, no edema NEURO: Alert and oriented Pelvic exam: Cervix pink, visually closed, without lesion, moderate amount dark red bleeding with clots, large clot removed from cervical os with ring forceps, vaginal  walls and external genitalia normal Bimanual exam: Cervix 0/long/high, firm, anterior, neg CMT, uterus tender, slightly enlarged, adnexa without tenderness, enlargement, or mass  LAB RESULTS Results for orders placed or performed during the hospital encounter of 10/20/14 (from the past 24 hour(s))  Pregnancy, urine POC     Status: Abnormal   Collection Time:  10/20/14  1:32 PM  Result Value Ref Range   Preg Test, Ur POSITIVE (A) NEGATIVE  CBC     Status: Abnormal   Collection Time: 10/20/14  1:49 PM  Result Value Ref Range   WBC 11.7 (H) 4.0 - 10.5 K/uL   RBC 3.82 (L) 3.87 - 5.11 MIL/uL   Hemoglobin 11.2 (L) 12.0 - 15.0 g/dL   HCT 16.1 (L) 09.6 - 04.5 %   MCV 87.7 78.0 - 100.0 fL   MCH 29.3 26.0 - 34.0 pg   MCHC 33.4 30.0 - 36.0 g/dL   RDW 40.9 81.1 - 91.4 %   Platelets 270 150 - 400 K/uL  hCG, quantitative, pregnancy     Status: Abnormal   Collection Time: 10/20/14  1:49 PM  Result Value Ref Range   hCG, Beta Chain, Quant, S 4102 (H) <5 mIU/mL    IMAGING US Ob Comp Less 14 Wks  10/20/2014   CLINICAL DATA:  Vaginal bleeding. The patient reports that at her recent OBGYN appointment, no fetal heart tones were reportedly auscultated at in office Doppler exam. Patient reports passing approximate 11 week fetus at home.  EXAM: OBSTETRIC <14 WK [place] AND TRANSVAGINAL OB US  TECHNIQUE: Both transabdominal and transvaginal ultrasound examinations were performed for complete evaluation of the gestation as well as the maternal uterus, adnexal regions, and pelvic cul-de-sac. Transvaginal technique was performed to assess early pregnancy.  COMPARISON:  09/18/2014  FINDINGS: Intrauterine gestational sac: Not visualized  Yolk sac:  Not visualized  Embryo:  Not visualized  Cardiac Activity: No embryo visualized  Maternal uterus/adnexae: Endometrium is inhomogeneously echogenic with minimal internal color flow but no measurable abnormality. Right ovary appears normal. Left ovary not visualized.  IMPRESSION: No intrauterine gestational sac, yolk sac, or embryo identified. Given the clinical history and findings on the previous and current exams, this is most compatible with missed abortion. Endometrium is mildly thickened and inhomogeneous with mild internal vascularity suggesting blood product/debris, less likely retained products of conception by appearance.    Electronically Signed   By: Christiana Pellant M.D.   On: 10/20/2014 15:15   US Ob Transvaginal  10/20/2014   CLINICAL DATA:  Vaginal bleeding. The patient reports that at her recent OBGYN appointment, no fetal heart tones were reportedly auscultated at in office Doppler exam. Patient reports passing approximate 11 week fetus at home.  EXAM: OBSTETRIC <14 WK [place] AND TRANSVAGINAL OB US  TECHNIQUE: Both transabdominal and transvaginal ultrasound examinations were performed for complete evaluation of the gestation as well as the maternal uterus, adnexal regions, and pelvic cul-de-sac. Transvaginal technique was performed to assess early pregnancy.  COMPARISON:  09/18/2014  FINDINGS: Intrauterine gestational sac: Not visualized  Yolk sac:  Not visualized  Embryo:  Not visualized  Cardiac Activity: No embryo visualized  Maternal uterus/adnexae: Endometrium is inhomogeneously echogenic with minimal internal color flow but no measurable abnormality. Right ovary appears normal. Left ovary not visualized.  IMPRESSION: No intrauterine gestational sac, yolk sac, or embryo identified. Given the clinical history and findings on the previous and current exams, this is most compatible with missed abortion. Endometrium is mildly thickened and inhomogeneous with mild internal  vascularity suggesting blood product/debris, less likely retained products of conception by appearance.   Electronically Signed   By: Christiana Pellant M.D.   On: 10/20/2014 15:15    MAU Management CBC, quant hcg, U/S, Dilaudid 1 mg IM Pt reports reduced bleeding after pelvic exam, soaking 1/2 pad in last hour  ASSESSMENT 1. Complete miscarriage   2. SAB (spontaneous abortion)   3. Vaginal bleeding in pregnancy, first trimester     PLAN Discharge home with bleeding precautions F/U with your prenatal provider in 1 week Return to MAU as needed for emergencies    Medication List    STOP taking these medications        acetaminophen-codeine  300-30 MG per tablet  Commonly known as:  TYLENOL #3     cyclobenzaprine 10 MG tablet  Commonly known as:  FLEXERIL     HYDROcodone-acetaminophen 5-325 MG per tablet  Commonly known as:  NORCO/VICODIN     metroNIDAZOLE 500 MG tablet  Commonly known as:  FLAGYL      TAKE these medications        ibuprofen 600 MG tablet  Commonly known as:  ADVIL,MOTRIN  Take 1 tablet (600 mg total) by mouth every 6 (six) hours as needed.     oxyCODONE-acetaminophen 5-325 MG per tablet  Commonly known as:  PERCOCET/ROXICET  Take 1-2 tablets by mouth every 6 (six) hours as needed.     PRENATAL PO  Take 1 tablet by mouth daily.       Follow-up Information    Follow up In 1 week.   Why:  Your prenatal provider in [place]      Follow up with [facility] [place] [facility].   Why:  As needed for emergencies   Contact information:   [address] [id number] mc [place] [address] [phone]      Sharen Counter Certified Nurse-Midwife 10/20/2014  4:50 PM

## 2014-10-20 NOTE — Discharge Instructions (Signed)
Miscarriage A miscarriage is the sudden loss of an unborn baby (fetus) before the 20th week of pregnancy. Most miscarriages happen in the first 3 months of pregnancy. Sometimes, it happens before a woman even knows she is pregnant. A miscarriage is also called a "spontaneous miscarriage" or "early pregnancy loss." Having a miscarriage can be an emotional experience. Talk with your caregiver about any questions you may have about miscarrying, the grieving process, and your future pregnancy plans. CAUSES   Problems with the fetal chromosomes that make it impossible for the baby to develop normally. Problems with the baby's genes or chromosomes are most often the result of errors that occur, by chance, as the embryo divides and grows. The problems are not inherited from the parents.  Infection of the cervix or uterus.   Hormone problems.   Problems with the cervix, such as having an incompetent cervix. This is when the tissue in the cervix is not strong enough to hold the pregnancy.   Problems with the uterus, such as an abnormally shaped uterus, uterine fibroids, or congenital abnormalities.   Certain medical conditions.   Smoking, drinking alcohol, or taking illegal drugs.   Trauma.  Often, the cause of a miscarriage is unknown.  SYMPTOMS   Vaginal bleeding or spotting, with or without cramps or pain.  Pain or cramping in the abdomen or lower back.  Passing fluid, tissue, or blood clots from the vagina. DIAGNOSIS  Your caregiver will perform a physical exam. You may also have an ultrasound to confirm the miscarriage. Blood or urine tests may also be ordered. TREATMENT   Sometimes, treatment is not necessary if you naturally pass all the fetal tissue that was in the uterus. If some of the fetus or placenta remains in the body (incomplete miscarriage), tissue left behind may become infected and must be removed. Usually, a dilation and curettage (D and C) procedure is performed.  During a D and C procedure, the cervix is widened (dilated) and any remaining fetal or placental tissue is gently removed from the uterus.  Antibiotic medicines are prescribed if there is an infection. Other medicines may be given to reduce the size of the uterus (contract) if there is a lot of bleeding.  If you have Rh negative blood and your baby was Rh positive, you will need a Rh immunoglobulin shot. This shot will protect any future baby from having Rh blood problems in future pregnancies. HOME CARE INSTRUCTIONS   Your caregiver may order bed rest or may allow you to continue light activity. Resume activity as directed by your caregiver.  Have someone help with home and family responsibilities during this time.   Keep track of the number of sanitary pads you use each day and how soaked (saturated) they are. Write down this information.   Do not use tampons. Do not douche or have sexual intercourse until approved by your caregiver.   Only take over-the-counter or prescription medicines for pain or discomfort as directed by your caregiver.   Do not take aspirin. Aspirin can cause bleeding.   Keep all follow-up appointments with your caregiver.   If you or your partner have problems with grieving, talk to your caregiver or seek counseling to help cope with the pregnancy loss. Allow enough time to grieve before trying to get pregnant again.  SEEK IMMEDIATE MEDICAL CARE IF:   You have severe cramps or pain in your back or abdomen.  You have a fever.  You pass large blood clots (walnut-sized   or larger) ortissue from your vagina. Save any tissue for your caregiver to inspect.   Your bleeding increases.   You have a thick, bad-smelling vaginal discharge.  You become lightheaded, weak, or you faint.   You have chills.  MAKE SURE YOU:  Understand these instructions.  Will watch your condition.  Will get help right away if you are not doing well or get  worse. Document Released: 12/31/2000 Document Revised: 11/01/2012 Document Reviewed: 08/26/2011 Exit[employer] Patient Information 2015 [employer]. This information is not intended to replace advice given to you by your health care provider. Make sure you discuss any questions you have with your health care provider.  

## 2014-10-20 NOTE — MAU Note (Signed)
Pt presents to MAU with complaints of vaginal bleeding that started this morning. [facility] and was told baby had no heart rate. Gestation 11 weeks per patient.

## 2015-08-25 ENCOUNTER — Encounter (HOSPITAL_COMMUNITY): Payer: Self-pay | Admitting: *Deleted

## 2016-12-19 ENCOUNTER — Emergency Department (HOSPITAL_COMMUNITY)
Admission: EM | Admit: 2016-12-19 | Discharge: 2016-12-19 | Disposition: A | Discharge status: Home / Self Care | Payer: Self-pay | Attending: Emergency Medicine | Admitting: Emergency Medicine

## 2016-12-19 ENCOUNTER — Encounter (HOSPITAL_COMMUNITY): Payer: Self-pay | Admitting: Emergency Medicine

## 2016-12-19 ENCOUNTER — Emergency Department (HOSPITAL_COMMUNITY): Payer: Self-pay

## 2016-12-19 DIAGNOSIS — Y939 Activity, unspecified: Secondary | ICD-10-CM | POA: Insufficient documentation

## 2016-12-19 DIAGNOSIS — F17200 Nicotine dependence, unspecified, uncomplicated: Secondary | ICD-10-CM | POA: Insufficient documentation

## 2016-12-19 DIAGNOSIS — Y929 Unspecified place or not applicable: Secondary | ICD-10-CM | POA: Insufficient documentation

## 2016-12-19 DIAGNOSIS — Y999 Unspecified external cause status: Secondary | ICD-10-CM | POA: Insufficient documentation

## 2016-12-19 DIAGNOSIS — M25511 Pain in right shoulder: Secondary | ICD-10-CM | POA: Insufficient documentation

## 2016-12-19 DIAGNOSIS — R51 Headache: Secondary | ICD-10-CM | POA: Insufficient documentation

## 2016-12-19 DIAGNOSIS — Z79899 Other long term (current) drug therapy: Secondary | ICD-10-CM | POA: Insufficient documentation

## 2016-12-19 DIAGNOSIS — M79642 Pain in left hand: Secondary | ICD-10-CM | POA: Insufficient documentation

## 2016-12-19 MED ORDER — KETOROLAC TROMETHAMINE 60 MG/2ML IM SOLN
30.0000 mg | Freq: Once | INTRAMUSCULAR | Status: AC
  Administered 2016-12-19: 30 mg via INTRAMUSCULAR
  Filled 2016-12-19: qty 2

## 2016-12-19 NOTE — ED Notes (Signed)
Bed: WTR6 Expected date:  Expected time:  Means of arrival:  Comments: 

## 2016-12-19 NOTE — Discharge Instructions (Signed)
Alternate 600 mg of ibuprofen and [phone] mg of Tylenol every 3 hours. Apply ice or heat to the affected areas multiple times daily for 20 minutes at a time. Follow-up with your primary care if pain persists. Return to the ED if any concerning symptoms develop. Use Moist heat therapy by taking a dish rag and soaking in water then ringing out excess water then microwave for 10-15 seconds until hot but not so hot that it would burn and heat to areas of soreness for 15 minutes, multiple times a day.

## 2016-12-19 NOTE — ED Notes (Signed)
[facility] left hand xray only, besides CT scans.
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
## 2018-04-20 ENCOUNTER — Ambulatory Visit: Payer: Self-pay | Admitting: Family Medicine

## 2018-05-24 ENCOUNTER — Other Ambulatory Visit: Payer: Self-pay | Admitting: Family Medicine

## 2018-05-24 DIAGNOSIS — R1903 Right lower quadrant abdominal swelling, mass and lump: Secondary | ICD-10-CM

## 2018-05-24 DIAGNOSIS — R102 Pelvic and perineal pain: Secondary | ICD-10-CM

## 2018-05-27 ENCOUNTER — Ambulatory Visit: Payer: Managed Care, Other (non HMO)
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
## 2021-04-01 ENCOUNTER — Ambulatory Visit (HOSPITAL_COMMUNITY)
Admission: EM | Admit: 2021-04-01 | Discharge: 2021-04-01 | Disposition: A | Discharge status: Home / Self Care | Payer: 59

## 2021-04-01 ENCOUNTER — Other Ambulatory Visit: Payer: Self-pay

## 2021-04-01 ENCOUNTER — Encounter (HOSPITAL_COMMUNITY): Payer: Self-pay | Admitting: *Deleted

## 2021-04-01 DIAGNOSIS — J029 Acute pharyngitis, unspecified: Secondary | ICD-10-CM | POA: Diagnosis not present

## 2021-04-01 NOTE — ED Triage Notes (Signed)
Pt reports she feels better but missed 2 days of work and wants to make sure it OK to return to work.

## 2021-04-01 NOTE — ED Provider Notes (Signed)
[facility]    CSN: [id number] Arrival date & time: 04/01/21  1707      History   Chief Complaint Chief Complaint  Patient presents with   Sore Throat    HPI Gail Brewer is a 45 y.o. female.   Patient here for evaluation of sore throat that started several days ago.  Reports symptoms have significantly improved and is wanting to be evaluated to ensure that she is okay to return to work tomorrow.  Denies any congestion or cough.  Denies any fevers at home.  Denies any trauma, injury, or other precipitating event.  Denies any specific alleviating or aggravating factors.  Denies any chest pain, shortness of breath, N/V/D, numbness, tingling, weakness, abdominal pain, or headaches.    The history is provided by the patient.  Sore Throat   Past Medical History:  Diagnosis Date   Anemia    Anxiety    Depression    Headache(784.0)    Heart murmur    asymptomatic    There are no problems to display for this patient.   Past Surgical History:  Procedure Laterality Date   CESAREAN SECTION     x 2   CESAREAN SECTION  07/24/2011   Procedure: CESAREAN SECTION;  Surgeon: Fortino Sic, MD;  Location: [facility];  Service: Gynecology;  Laterality: N/A;  Repeat Cesarian Section    OB History     Gravida  4   Para  3   Term  3   Preterm      AB      Living  3      SAB      IAB      Ectopic      Multiple      Live Births  1            Home Medications    Prior to Admission medications   Medication Sig Start Date End Date Taking? Authorizing Provider  ibuprofen (ADVIL,MOTRIN) 600 MG tablet Take 1 tablet (600 mg total) by mouth every 6 (six) hours as needed. 10/20/14   Leftwich-Kirby, Wilmer Floor, CNM  oxyCODONE-acetaminophen (PERCOCET/ROXICET) 5-325 MG per tablet Take 1-2 tablets by mouth every 6 (six) hours as needed. 10/20/14   Leftwich-Kirby, Wilmer Floor, CNM  Prenatal Vit-Fe Fumarate-FA (PRENATAL PO) Take 1 tablet by mouth daily.    [provider]    Family History History reviewed. No pertinent family history.  Social History Social History   Tobacco Use   Smoking status: Some Days   Smokeless tobacco: Never  Substance Use Topics   Alcohol use: No   Drug use: No     Allergies   Patient has no known allergies.   Review of Systems Review of Systems  Constitutional:  Negative for fever.  HENT:  Positive for sore throat. Negative for congestion.   All other systems reviewed and are negative.   Physical Exam Triage Vital Signs ED Triage Vitals  Enc Vitals Group     BP 04/01/21 1727 128/90     Pulse Rate 04/01/21 1727 85     Resp 04/01/21 1727 20     Temp 04/01/21 1727 99.2 F (37.3 C)     Temp src --      SpO2 04/01/21 1727 95 %     Weight --      Height --      Head Circumference --      Peak Flow --  Pain Score 04/01/21 1724 0     Pain Loc --      Pain Edu? --      Excl. in GC? --    No data found.  Updated Vital Signs BP 128/90   Pulse 85   Temp 99.2 F (37.3 C)   Resp 20   LMP 03/11/2021   SpO2 95%   Visual Acuity Right Eye Distance:   Left Eye Distance:   Bilateral Distance:    Right Eye Near:   Left Eye Near:    Bilateral Near:     Physical Exam Vitals and nursing note reviewed.  Constitutional:      General: She is not in acute distress.    Appearance: Normal appearance. She is not ill-appearing, toxic-appearing or diaphoretic.  HENT:     Head: Normocephalic and atraumatic.     Nose: No congestion or rhinorrhea.     Mouth/Throat:     Pharynx: Uvula midline. No pharyngeal swelling, posterior oropharyngeal erythema or uvula swelling.     Tonsils: No tonsillar exudate or tonsillar abscesses. 0 on the right. 0 on the left.  Eyes:     Conjunctiva/sclera: Conjunctivae normal.  Cardiovascular:     Rate and Rhythm: Normal rate and regular rhythm.     Pulses: Normal pulses.     Heart sounds: Normal heart sounds.  Pulmonary:     Effort: Pulmonary effort is  normal.     Breath sounds: Normal breath sounds.  Abdominal:     General: Abdomen is flat.  Musculoskeletal:        General: Normal range of motion.     Cervical back: Normal range of motion.  Skin:    General: Skin is warm and dry.  Neurological:     General: No focal deficit present.     Mental Status: She is alert and oriented to person, place, and time.  Psychiatric:        Mood and Affect: Mood normal.     UC Treatments / Results  Labs (all labs ordered are listed, but only abnormal results are displayed) Labs Reviewed - No data to display  EKG   Radiology No results found.  Procedures Procedures (including critical care time)  Medications Ordered in UC Medications - No data to display  Initial Impression / Assessment and Plan / UC Course  I have reviewed the triage vital signs and the nursing notes.  Pertinent labs & imaging results that were available during my care of the patient were reviewed by me and considered in my medical decision making (see chart for details).    Assessment negative for red flags or concerns.  This is likely viral pharyngitis.  Patient declines any COVID screening at this time.  Tylenol and or ibuprofen as needed.  Discussed conservative symptom management as described in discharge instructions.  Encourage fluids and rest.  Patient may return to work tomorrow.  Follow-up as needed Final Clinical Impressions(s) / UC Diagnoses   Final diagnoses:  Viral pharyngitis     Discharge Instructions      You can take Tylenol and/or Ibuprofen as needed for fever reduction and pain relief.   For sore throat: try warm salt water gargles, cepacol lozenges, throat spray, warm tea or water with lemon/honey, popsicles or ice  It is important to stay hydrated: drink plenty of fluids (water, gatorade/powerade/pedialyte, juices, or teas) to keep your throat moisturized and help further relieve irritation/discomfort.   Return or go to the Emergency  Department  if symptoms worsen or do not improve in the next few days.      ED Prescriptions   None    PDMP not reviewed this encounter.   Ivette Loyal, NP 04/01/21 1744

## 2021-04-01 NOTE — Discharge Instructions (Signed)
You can take Tylenol and/or Ibuprofen as needed for fever reduction and pain relief.   For sore throat: try warm salt water gargles, cepacol lozenges, throat spray, warm tea or water with lemon/honey, popsicles or ice   It is important to stay hydrated: drink plenty of fluids (water, gatorade/powerade/pedialyte, juices, or teas) to keep your throat moisturized and help further relieve irritation/discomfort.   Return or go to the Emergency Department if symptoms worsen or do not improve in the next few days.  

## 2021-07-21 DIAGNOSIS — U071 COVID-19: Secondary | ICD-10-CM

## 2021-07-21 HISTORY — DX: COVID-19: U07.1

## 2021-07-30 ENCOUNTER — Other Ambulatory Visit: Payer: Self-pay | Admitting: Family Medicine

## 2021-07-30 DIAGNOSIS — Z1231 Encounter for screening mammogram for malignant neoplasm of breast: Secondary | ICD-10-CM

## 2021-08-13 DIAGNOSIS — N898 Other specified noninflammatory disorders of vagina: Secondary | ICD-10-CM | POA: Diagnosis not present

## 2021-08-13 DIAGNOSIS — Z716 Tobacco abuse counseling: Secondary | ICD-10-CM | POA: Diagnosis not present

## 2021-08-13 DIAGNOSIS — R69 Illness, unspecified: Secondary | ICD-10-CM | POA: Diagnosis not present

## 2021-08-19 ENCOUNTER — Ambulatory Visit
Admission: RE | Admit: 2021-08-19 | Discharge: 2021-08-19 | Disposition: A | Discharge status: Home / Self Care | Payer: 59 | Source: Ambulatory Visit | Attending: Family Medicine | Admitting: Family Medicine

## 2021-08-19 ENCOUNTER — Ambulatory Visit: Payer: 59

## 2021-08-19 ENCOUNTER — Other Ambulatory Visit: Payer: Self-pay

## 2021-08-19 DIAGNOSIS — Z1231 Encounter for screening mammogram for malignant neoplasm of breast: Secondary | ICD-10-CM | POA: Diagnosis not present

## 2021-08-19 DIAGNOSIS — R69 Illness, unspecified: Secondary | ICD-10-CM | POA: Diagnosis not present

## 2021-08-30 DIAGNOSIS — R69 Illness, unspecified: Secondary | ICD-10-CM | POA: Diagnosis not present

## 2021-09-09 ENCOUNTER — Ambulatory Visit (INDEPENDENT_AMBULATORY_CARE_PROVIDER_SITE_OTHER): Payer: 59 | Admitting: Family Medicine

## 2021-09-09 ENCOUNTER — Other Ambulatory Visit: Payer: Self-pay

## 2021-09-09 ENCOUNTER — Encounter: Payer: Self-pay | Admitting: Family Medicine

## 2021-09-09 VITALS — BP 118/82 | HR 72 | Ht 65.0 in | Wt 252.6 lb

## 2021-09-09 DIAGNOSIS — Z7689 Persons encountering health services in other specified circumstances: Secondary | ICD-10-CM

## 2021-09-09 DIAGNOSIS — R222 Localized swelling, mass and lump, trunk: Secondary | ICD-10-CM | POA: Diagnosis not present

## 2021-09-09 DIAGNOSIS — N62 Hypertrophy of breast: Secondary | ICD-10-CM

## 2021-09-09 NOTE — Progress Notes (Signed)
Pt presents in office to establish care. She has a concern  about growth in pelvic area that is painful, pain comes and goes with cycles. Premenstrual and postmenstrual pain. Pt state she recently had PAP and declined STD testing.  Last PAP: 2022 Last mammogram: 08-19-21  PHQ9=1 GAD7=3

## 2021-09-09 NOTE — Progress Notes (Signed)
GYNECOLOGY OFFICE VISIT NOTE  History:   Gail Brewer is a 46 y.o. [phone] here today to establish care and to be evaluated for a painful abdominal growth.   Reports she started noticing a bump in her right lower abdomen around 2016. Since then, it has continued to get larger and more painful. She can feel it under her skin. Constant discomfort, but worse before and after her menstrual cycle. Receives tramadol PRN from her PCP due to discomfort, in addition to using ibuprofen. Eating and drinking as normal. Normal BM's. She is frustrated because she has been back and forth between surgery and ob/gyn, both recommending she see the other one for evaluation. Most of the evaluation has been in [place] and not available on Care Everywhere. Are last imaged in 2022 in [place] but do not have records, reports it just appeared larger. She has a history 3 c-sections (last in 2013), otherwise no other abdominal surgery.   She recently moved back to [place] from [place].   CT in 2021: "There is 4.1 cm soft tissue density structure in subcutaneous plane in the right inguinal region. This may suggest postsurgical change or endometrioma or some other inflammatory or neoplastic process. This lesion appears slightly larger in size."   Past Medical History:  Diagnosis Date   Anemia    Anxiety    Depression    Headache(784.0)    Heart murmur    asymptomatic    Past Surgical History:  Procedure Laterality Date   CESAREAN SECTION     x 2   CESAREAN SECTION  07/24/2011   Procedure: CESAREAN SECTION;  Surgeon: Fortino Sic, MD;  Location: [facility];  Service: Gynecology;  Laterality: N/A;  Repeat Cesarian Section    The following portions of the patient's history were reviewed and updated as appropriate: allergies, current medications, past family history, past medical history, past social history, past surgical history and problem list.   Health Maintenance:  Normal pap and negative HRHPV a few weeks ago in [place]  per patient report.  Normal mammogram in 07/2021.   Review of Systems:  Pertinent items noted in HPI and remainder of comprehensive ROS otherwise negative.  Physical Exam:  BP 118/82    Pulse 72    Ht 5\' 5"  (1.651 m)    Wt 252 lb 9.6 oz (114.6 kg)    BMI 42.03 kg/m  CONSTITUTIONAL: Well-developed, well-nourished female in no acute distress.  HEENT:  Normocephalic, atraumatic.  NECK: Normal range of motion, SKIN: No rash noted.  MUSCULOSKELETAL: Normal range of motion. No edema noted. NEUROLOGIC: Alert and oriented to person, place, and time. PSYCHIATRIC: Normal mood and affect. Normal behavior. CARDIOVASCULAR: Normal heart rate noted RESPIRATORY: Effort normal, no problems with respiration noted ABDOMEN: Soft, non-distended. Subcutaneous mass present visually and with palpation in R lower abdomen, immediately superior-lateral to previous c-section scar. Measuring about 6x6cm. Feels hard and mostly discrete (some areas less defined with palpation), immobile. Does not increase in size with valsalva. No overlying skin changes. Can also palpate scar tissue along c-section scar without discrete mass-like feel present on the R side as above.  PELVIC: Deferred  Labs and Imaging No results found for this or any previous visit (from the past 168 hour(s)). MM 3D SCREEN BREAST BILATERAL  Result Date: 08/19/2021 CLINICAL DATA:  Screening. EXAM: DIGITAL SCREENING BILATERAL MAMMOGRAM WITH TOMOSYNTHESIS AND CAD TECHNIQUE: Bilateral screening digital craniocaudal and mediolateral oblique mammograms were obtained. Bilateral screening digital breast tomosynthesis was performed. The images  were evaluated with computer-aided detection. COMPARISON:  None. ACR Breast Density Category b: There are scattered areas of fibroglandular density. FINDINGS: There are no findings suspicious for malignancy. IMPRESSION: No mammographic evidence of malignancy. A result letter of this screening mammogram will be mailed directly  to the patient. RECOMMENDATION: Screening mammogram in one year. (Code:SM-B-01Y) BI-RADS CATEGORY  1: Negative. Electronically Signed   By: Emmaline Kluver M.D.   On: 08/19/2021 14:51      Assessment and Plan:   1. Subcutaneous mass of abdominal wall Impacting quality of life with associated pain. Appears superficial with palpation and visualization, likely within subcutaneous tissue as noted on CT 2021, previously measuring about 4cm but now feels about 6x6 on exam. While very close to her c-section scar, would not expect continued growth of scar tissue and does not feel like an incisional hernia. Subcutaneous endometriosis would be quite uncommon. Reviewed CT and presentation with Dr. Debroah Loop, Ob/gyn, who recommended general surgery consultation. Referred placed.   2. Encounter to establish care Recent pap + STD screening in [place], will attempt to obtain records. UTD on mammogram.    3. Symptomatic Macromastia Associated with chronic back pain. Previously evaluated by plastic surgery who planned on proceeding with reduction after non-surgical treatment trial. She can no longer return to this office due to insurance changes. Will place referral to different office for evaluation.    Return if symptoms worsen or fail to improve.     Leticia Penna, DO  OB Fellow, Faculty [facility], Center for [facility] 09/09/2021 1:26 PM

## 2021-09-10 DIAGNOSIS — R69 Illness, unspecified: Secondary | ICD-10-CM | POA: Diagnosis not present

## 2021-09-11 ENCOUNTER — Encounter: Payer: Self-pay | Admitting: Family Medicine

## 2021-09-16 DIAGNOSIS — R69 Illness, unspecified: Secondary | ICD-10-CM | POA: Diagnosis not present

## 2021-09-23 DIAGNOSIS — R69 Illness, unspecified: Secondary | ICD-10-CM | POA: Diagnosis not present

## 2021-09-30 ENCOUNTER — Ambulatory Visit (INDEPENDENT_AMBULATORY_CARE_PROVIDER_SITE_OTHER): Payer: 59 | Admitting: Plastic Surgery

## 2021-09-30 VITALS — BP 144/90 | HR 68 | Ht 65.0 in | Wt 246.4 lb

## 2021-09-30 DIAGNOSIS — R69 Illness, unspecified: Secondary | ICD-10-CM | POA: Diagnosis not present

## 2021-09-30 DIAGNOSIS — M546 Pain in thoracic spine: Secondary | ICD-10-CM | POA: Diagnosis not present

## 2021-09-30 DIAGNOSIS — N62 Hypertrophy of breast: Secondary | ICD-10-CM | POA: Diagnosis not present

## 2021-09-30 DIAGNOSIS — G8929 Other chronic pain: Secondary | ICD-10-CM

## 2021-09-30 NOTE — Progress Notes (Signed)
? ?Referring Provider ?Allayne Stack, DO ?[address] ?[place],  [place] [address]  ? ?CC:  ?Breast hypertrophy. ? ? ?Gail Brewer is an 46 y.o. female.  ?HPI:  ?Mammary Hyperplasia: ?The patient is a 46 y.o. female with a history of mammary hyperplasia for several years.  She has extremely large breasts causing symptoms that include the following: ?Back pain in the upper and lower back, including neck pain. She pulls or pins her bra straps to provide better lift and relief of the pressure and pain. She notices relief by holding her breast up manually.  Her shoulder straps cause grooves and pain and pressure that requires padding for relief. Pain medication is sometimes required with motrin and tylenol.  Activities that are hindered by enlarged breasts include: exercise and running.  She has tried supportive clothing as well as fitted bras without improvement. ? ?Her breasts are extremely large and fairly symmetric with the left larger.  She has hyperpigmentation of the inframammary area on both sides.   The BMI = 41.  Preoperative bra size = G cup.   Mammogram history: 08/19/2021 benign at the breast center.  Family history of breast cancer: None.  Tobacco use: None.   The patient expresses the desire to pursue surgical intervention.  ? ?No Known Allergies ? ?Outpatient Encounter Medications as of 09/30/2021  ?Medication Sig  ? escitalopram (LEXAPRO) 20 MG tablet Take 20 mg by mouth daily.  ? ibuprofen (ADVIL,MOTRIN) 600 MG tablet Take 1 tablet (600 mg total) by mouth every 6 (six) hours as needed.  ? oxyCODONE-acetaminophen (PERCOCET/ROXICET) 5-325 MG per tablet Take 1-2 tablets by mouth every 6 (six) hours as needed.  ? phentermine (ADIPEX-P) 37.5 MG tablet Take 37.5 mg by mouth daily before breakfast.  ? Prenatal Vit-Fe Fumarate-FA (PRENATAL PO) Take 1 tablet by mouth daily.  ? traMADol-acetaminophen (ULTRACET) 37.5-325 MG tablet Take 1 tablet by mouth every 6 (six) hours as needed.  ? ?No  facility-administered encounter medications on file as of 09/30/2021.  ?  ? ?Past Medical History:  ?Diagnosis Date  ? Anemia   ? Anxiety   ? Depression   ? Headache(784.0)   ? Heart murmur   ? asymptomatic  ? ? ?Past Surgical History:  ?Procedure Laterality Date  ? CESAREAN SECTION    ? x 2  ? CESAREAN SECTION  07/24/2011  ? Procedure: CESAREAN SECTION;  Surgeon: Fortino Sic, MD;  Location: [facility];  Service: Gynecology;  Laterality: N/A;  Repeat Cesarian Section  ? ? ?Family History  ?Problem Relation Age of Onset  ? Kidney failure Mother   ? Breast cancer Paternal Grandmother   ? ? ?Social History  ? ?Social History Narrative  ? Not on file  ?  ? ?Review of Systems ?General: Denies fevers, chills, weight loss ?CV: Denies chest pain, shortness of breath, palpitations ? ? ?Physical Exam ?Vitals with BMI 09/30/2021 09/09/2021 04/01/2021  ?Height 5\' 5"  5\' 5"  -  ?Weight 246 lbs 6 oz 252 lbs 10 oz -  ?BMI 41 42.03 -  ?Systolic 144 118  ?Diastolic 90 82 90  ?Pulse 68 72 85  ?  ?General:  No acute distress,  Alert and oriented, Non-Toxic, Normal speech and affect ?Breast: Significant macromastia, no masses on physical exam, significant bilateral ptosis the sternal to nipple distance on the right is 42 cm and the left is 45 cm.  The IMF distance is 17 cm.  Base width is 23 cm bilaterally ? ?Assessment/Plan ?  The patient has bilateral symptomatic macromastia.  She is a good candidate for a breast reduction.  She is interested in pursuing surgical treatment.  She has tried supportive garments and fitted bras with no relief.  The details of breast reduction surgery were discussed.  I explained the procedure in detail along the with the expected scars.  The risks were discussed in detail and include bleeding, infection, damage to surrounding structures, need for additional procedures, nipple loss, change in nipple sensation, persistent pain, contour irregularities and asymmetries.  I explained that breast feeding is often  not possible after breast reduction surgery.  We discussed the expected postoperative course with an overall recovery period of about 1 month.  She demonstrated full understanding of all risks.  We discussed her personal risk factors that include high BMI.  The patient is interested in pursuing surgical treatment. ? ? ?The estimated excess breast tissue to be removed at the time of surgery = greater than 1000 g from each breast.  ?Gail Brewer ?09/30/2021, 3:39 PM  ? ? ?  ?

## 2021-10-14 DIAGNOSIS — R69 Illness, unspecified: Secondary | ICD-10-CM | POA: Diagnosis not present

## 2021-10-21 DIAGNOSIS — R69 Illness, unspecified: Secondary | ICD-10-CM | POA: Diagnosis not present

## 2021-11-05 ENCOUNTER — Other Ambulatory Visit: Payer: Self-pay | Admitting: Surgery

## 2021-11-05 DIAGNOSIS — R222 Localized swelling, mass and lump, trunk: Secondary | ICD-10-CM | POA: Diagnosis not present

## 2021-11-11 ENCOUNTER — Other Ambulatory Visit: Payer: Self-pay

## 2021-11-11 ENCOUNTER — Encounter (HOSPITAL_BASED_OUTPATIENT_CLINIC_OR_DEPARTMENT_OTHER): Payer: Self-pay | Admitting: Surgery

## 2021-11-19 NOTE — H&P (Signed)
?  REFERRING PHYSICIAN: Norva Riffle,* ? ?PROVIDER: Wayne Both, MD ? ?MRN: [id number] ?DOB: [birth year] ?DATE OF ENCOUNTER: 11/05/2021 ?Subjective  ? ?Chief Complaint: abdominal mass ? ? ?History of Present Illness: ?Gail Brewer is a 46 y.o. female who is seen today as an office consultation for evaluation of abdominal mass ?.  ? ?This is a 46 year old female who reports having had a right lower quadrant abdominal wall mass for several years. It has been slowly getting larger. It causes discomfort with motion and with sitting. She has had a previous CT scan showing a 1.4 cm soft tissue density in the subcutaneous tissue. It is suspected this could be an endometrioma. She has no nausea or vomiting. The pain is moderate to severe at times and focal and sharp. Does not refer anywhere else. ? ?Review of Systems: ?A complete review of systems was obtained from the patient. I have reviewed this information and discussed as appropriate with the patient. See HPI as well for other ROS. ? ?ROS  ? ?Medical History: ?History reviewed. No pertinent past medical history. ? ?There is no problem list on file for this patient. ? ?Past Surgical History:  ?Procedure Laterality Date  ? CESAREAN SECTION N/A  ? ? ?Allergies  ?Allergen Reactions  ? Metronidazole Other (See Comments)  ?Yeast infection  ? ?Current Outpatient Medications on File Prior to Visit  ?Medication Sig Dispense Refill  ? escitalopram oxalate (LEXAPRO) 20 MG tablet Take 20 mg by mouth once daily  ? traMADoL (ULTRAM) 50 mg tablet TAKE 1 TABLET BY MOUTH EVERY 6 TO 8 HOURS AS NEEDED  ? ?No current facility-administered medications on file prior to visit.  ? ?History reviewed. No pertinent family history.  ? ?Social History  ? ?Tobacco Use  ?Smoking Status Never  ?Smokeless Tobacco Never  ? ? ?Social History  ? ?Socioeconomic History  ? Marital status: Divorced  ?Tobacco Use  ? Smoking status: Never  ? Smokeless tobacco: Never  ?Vaping Use  ? Vaping  Use: Never used  ?Substance and Sexual Activity  ? Alcohol use: Yes  ? Drug use: Never  ? ?Objective:  ? ?Vitals:  ?11/05/21 1027  ?BP: 132/86  ?Pulse: 85  ?Temp: 36.4 ?C (97.5 ?F)  ?SpO2: 97%  ?Weight: (!) 111.9 kg (246 lb 12.8 oz)  ?Height: 165.1 cm (5\' 5" )  ? ?Body mass index is 41.07 kg/m?. ? ?Physical Exam  ? ?She appears well on exam ? ?There is a mass in the right lower quadrant of her abdominal wall just above her C-section incision. It feels to be almost 5 cm in size at this point. It is fairly fixed it is firm ? ?Labs, Imaging and Diagnostic Testing: ?I reviewed the report from her CT scan ? ?Assessment and Plan:  ? ?Diagnoses and all orders for this visit: ? ?Abdominal wall mass ? ? ? ?This is at least a 4 to 5 cm right lower quadrant abdominal wall mass which is causing symptoms. Again, it appears most consistent with an endometrioma. Because of her symptoms and increasing size, surgical excision of the mass is recommended. We discussed the surgical procedure in detail. I discussed the risk which includes but is not limited to bleeding, infection, injury to surrounding structures, recurrence, cardiopulmonary issues, postoperative recovery, etc. She understands and wished to proceed with surgery which will be scheduled.  ?

## 2021-11-20 ENCOUNTER — Other Ambulatory Visit: Payer: Self-pay

## 2021-11-20 ENCOUNTER — Ambulatory Visit (HOSPITAL_BASED_OUTPATIENT_CLINIC_OR_DEPARTMENT_OTHER): Payer: 59 | Admitting: Anesthesiology

## 2021-11-20 ENCOUNTER — Encounter (HOSPITAL_BASED_OUTPATIENT_CLINIC_OR_DEPARTMENT_OTHER): Payer: Self-pay | Admitting: Surgery

## 2021-11-20 ENCOUNTER — Ambulatory Visit (HOSPITAL_BASED_OUTPATIENT_CLINIC_OR_DEPARTMENT_OTHER)
Admission: RE | Admit: 2021-11-20 | Discharge: 2021-11-20 | Disposition: A | Discharge status: Home / Self Care | Payer: 59 | Source: Ambulatory Visit | Attending: Surgery | Admitting: Surgery

## 2021-11-20 ENCOUNTER — Encounter (HOSPITAL_BASED_OUTPATIENT_CLINIC_OR_DEPARTMENT_OTHER)
Admission: RE | Disposition: A | Discharge status: Home / Self Care | Payer: Self-pay | Source: Ambulatory Visit | Attending: Surgery

## 2021-11-20 DIAGNOSIS — Z6841 Body Mass Index (BMI) 40.0 and over, adult: Secondary | ICD-10-CM | POA: Insufficient documentation

## 2021-11-20 DIAGNOSIS — N809 Endometriosis, unspecified: Secondary | ICD-10-CM | POA: Insufficient documentation

## 2021-11-20 DIAGNOSIS — Z87891 Personal history of nicotine dependence: Secondary | ICD-10-CM | POA: Diagnosis not present

## 2021-11-20 DIAGNOSIS — R1903 Right lower quadrant abdominal swelling, mass and lump: Secondary | ICD-10-CM | POA: Diagnosis not present

## 2021-11-20 DIAGNOSIS — R1900 Intra-abdominal and pelvic swelling, mass and lump, unspecified site: Secondary | ICD-10-CM | POA: Diagnosis not present

## 2021-11-20 DIAGNOSIS — N80C10 Endometriosis of the anterior abdominal wall, subcutaneous tissue: Secondary | ICD-10-CM | POA: Diagnosis not present

## 2021-11-20 DIAGNOSIS — N80C19 Endometriosis of the anterior abdominal wall, unspecified depth: Secondary | ICD-10-CM | POA: Diagnosis not present

## 2021-11-20 HISTORY — PX: RESECTION OF ABDOMINAL MASS: SHX6450

## 2021-11-20 LAB — POCT PREGNANCY, URINE: Preg Test, Ur: NEGATIVE

## 2021-11-20 SURGERY — RESECTION OF ABDOMINAL MASS
Anesthesia: General | Site: Abdomen | Laterality: Right

## 2021-11-20 MED ORDER — CHLORHEXIDINE GLUCONATE CLOTH 2 % EX PADS
6.0000 | MEDICATED_PAD | Freq: Once | CUTANEOUS | Status: AC
Start: 2021-11-20 — End: 2021-11-20
  Administered 2021-11-20: 6 via TOPICAL

## 2021-11-20 MED ORDER — CHLORHEXIDINE GLUCONATE CLOTH 2 % EX PADS: 6.0000 | MEDICATED_PAD | Freq: Once | CUTANEOUS | Status: DC

## 2021-11-20 MED ORDER — ONDANSETRON HCL 4 MG/2ML IJ SOLN
INTRAMUSCULAR | Status: DC | PRN
  Administered 2021-11-20: 4 mg via INTRAVENOUS

## 2021-11-20 MED ORDER — SUCCINYLCHOLINE CHLORIDE 200 MG/10ML IV SOSY
PREFILLED_SYRINGE | INTRAVENOUS | Status: AC
  Filled 2021-11-20: qty 10

## 2021-11-20 MED ORDER — OXYCODONE HCL 5 MG PO TABS
ORAL_TABLET | ORAL | Status: AC
  Filled 2021-11-20: qty 1

## 2021-11-20 MED ORDER — ACETAMINOPHEN 500 MG PO TABS
1000.0000 mg | ORAL_TABLET | ORAL | Status: AC
  Administered 2021-11-20: 1000 mg via ORAL

## 2021-11-20 MED ORDER — MIDAZOLAM HCL 5 MG/5ML IJ SOLN
INTRAMUSCULAR | Status: DC | PRN
Start: 2021-11-20 — End: 2021-11-20
  Administered 2021-11-20: 2 mg via INTRAVENOUS

## 2021-11-20 MED ORDER — FENTANYL CITRATE (PF) 100 MCG/2ML IJ SOLN
INTRAMUSCULAR | Status: AC
  Filled 2021-11-20: qty 2

## 2021-11-20 MED ORDER — PHENYLEPHRINE 80 MCG/ML (10ML) SYRINGE FOR IV PUSH (FOR BLOOD PRESSURE SUPPORT)
PREFILLED_SYRINGE | INTRAVENOUS | Status: AC
  Filled 2021-11-20: qty 10

## 2021-11-20 MED ORDER — FENTANYL CITRATE (PF) 100 MCG/2ML IJ SOLN
25.0000 ug | INTRAMUSCULAR | Status: DC | PRN
  Administered 2021-11-20 (×2): 50 ug via INTRAVENOUS

## 2021-11-20 MED ORDER — ONDANSETRON HCL 4 MG/2ML IJ SOLN
INTRAMUSCULAR | Status: AC
  Filled 2021-11-20: qty 2

## 2021-11-20 MED ORDER — LACTATED RINGERS IV SOLN: INTRAVENOUS | Status: DC

## 2021-11-20 MED ORDER — BUPIVACAINE-EPINEPHRINE 0.5% -1:200000 IJ SOLN
INTRAMUSCULAR | Status: DC | PRN
  Administered 2021-11-20: 20 mL

## 2021-11-20 MED ORDER — DEXAMETHASONE SODIUM PHOSPHATE 4 MG/ML IJ SOLN
INTRAMUSCULAR | Status: DC | PRN
Start: 2021-11-20 — End: 2021-11-20
  Administered 2021-11-20: 5 mg via INTRAVENOUS

## 2021-11-20 MED ORDER — OXYCODONE HCL 5 MG PO TABS: 5.0000 mg | ORAL_TABLET | Freq: Four times a day (QID) | ORAL | 0 refills | Status: DC | PRN

## 2021-11-20 MED ORDER — EPHEDRINE 5 MG/ML INJ
INTRAVENOUS | Status: AC
  Filled 2021-11-20: qty 5

## 2021-11-20 MED ORDER — PROPOFOL 10 MG/ML IV BOLUS
INTRAVENOUS | Status: DC | PRN
Start: 2021-11-20 — End: 2021-11-20
  Administered 2021-11-20: 200 mg via INTRAVENOUS

## 2021-11-20 MED ORDER — MIDAZOLAM HCL 2 MG/2ML IJ SOLN
INTRAMUSCULAR | Status: AC
  Filled 2021-11-20: qty 2

## 2021-11-20 MED ORDER — ACETAMINOPHEN 500 MG PO TABS
ORAL_TABLET | ORAL | Status: AC
  Filled 2021-11-20: qty 2

## 2021-11-20 MED ORDER — AMISULPRIDE (ANTIEMETIC) 5 MG/2ML IV SOLN: 10.0000 mg | Freq: Once | INTRAVENOUS | Status: DC | PRN

## 2021-11-20 MED ORDER — OXYCODONE HCL 5 MG PO TABS
5.0000 mg | ORAL_TABLET | Freq: Once | ORAL | Status: AC | PRN
  Administered 2021-11-20: 5 mg via ORAL

## 2021-11-20 MED ORDER — CEFAZOLIN SODIUM-DEXTROSE 2-4 GM/100ML-% IV SOLN
2.0000 g | INTRAVENOUS | Status: AC
  Administered 2021-11-20: 2 g via INTRAVENOUS

## 2021-11-20 MED ORDER — CEFAZOLIN SODIUM-DEXTROSE 2-4 GM/100ML-% IV SOLN
INTRAVENOUS | Status: AC
  Filled 2021-11-20: qty 100

## 2021-11-20 MED ORDER — OXYCODONE HCL 5 MG/5ML PO SOLN: 5.0000 mg | Freq: Once | ORAL | Status: AC | PRN

## 2021-11-20 MED ORDER — LIDOCAINE HCL (CARDIAC) PF 100 MG/5ML IV SOSY
PREFILLED_SYRINGE | INTRAVENOUS | Status: DC | PRN
Start: 2021-11-20 — End: 2021-11-20
  Administered 2021-11-20: 60 mg via INTRAVENOUS

## 2021-11-20 MED ORDER — FENTANYL CITRATE (PF) 100 MCG/2ML IJ SOLN
INTRAMUSCULAR | Status: DC | PRN
  Administered 2021-11-20 (×2): 25 ug via INTRAVENOUS
  Administered 2021-11-20: 50 ug via INTRAVENOUS
  Administered 2021-11-20: 100 ug via INTRAVENOUS

## 2021-11-20 MED ORDER — LIDOCAINE 2% (20 MG/ML) 5 ML SYRINGE
INTRAMUSCULAR | Status: AC
  Filled 2021-11-20: qty 5

## 2021-11-20 SURGICAL SUPPLY — 40 items
ADH SKN CLS APL DERMABOND .7 (GAUZE/BANDAGES/DRESSINGS) ×1
APL PRP STRL LF DISP 70% ISPRP (MISCELLANEOUS) ×1
BLADE CLIPPER SURG (BLADE) IMPLANT
BLADE SURG 15 STRL LF DISP TIS (BLADE) ×1 IMPLANT
BLADE SURG 15 STRL SS (BLADE) ×2
BNDG ELASTIC 3X5.8 VLCR STR LF (GAUZE/BANDAGES/DRESSINGS) IMPLANT
BNDG ELASTIC 4X5.8 VLCR STR LF (GAUZE/BANDAGES/DRESSINGS) IMPLANT
CANISTER SUCT 1200ML W/VALVE (MISCELLANEOUS) IMPLANT
CHLORAPREP W/TINT 26 (MISCELLANEOUS) ×2 IMPLANT
COVER BACK TABLE 60X90IN (DRAPES) ×2 IMPLANT
COVER MAYO STAND STRL (DRAPES) ×2 IMPLANT
DERMABOND ADVANCED (GAUZE/BANDAGES/DRESSINGS) ×1
DERMABOND ADVANCED .7 DNX12 (GAUZE/BANDAGES/DRESSINGS) ×1 IMPLANT
DRAPE LAPAROTOMY 100X72 PEDS (DRAPES) ×2 IMPLANT
DRAPE UTILITY XL STRL (DRAPES) ×2 IMPLANT
ELECT REM PT RETURN 9FT ADLT (ELECTROSURGICAL) ×2
ELECTRODE REM PT RTRN 9FT ADLT (ELECTROSURGICAL) ×1 IMPLANT
GLOVE SURG SIGNA 7.5 PF LTX (GLOVE) ×4 IMPLANT
GOWN STRL REUS W/ TWL LRG LVL3 (GOWN DISPOSABLE) ×1 IMPLANT
GOWN STRL REUS W/ TWL XL LVL3 (GOWN DISPOSABLE) ×1 IMPLANT
GOWN STRL REUS W/TWL LRG LVL3 (GOWN DISPOSABLE) ×2
GOWN STRL REUS W/TWL XL LVL3 (GOWN DISPOSABLE) ×2
NDL HYPO 25X1 1.5 SAFETY (NEEDLE) ×1 IMPLANT
NEEDLE HYPO 25X1 1.5 SAFETY (NEEDLE) ×2 IMPLANT
NS IRRIG 1000ML POUR BTL (IV SOLUTION) IMPLANT
PACK BASIN DAY SURGERY FS (CUSTOM PROCEDURE TRAY) ×2 IMPLANT
PENCIL SMOKE EVACUATOR (MISCELLANEOUS) ×2 IMPLANT
SLEEVE SCD COMPRESS KNEE MED (STOCKING) ×1 IMPLANT
SPIKE FLUID TRANSFER (MISCELLANEOUS) IMPLANT
SPONGE T-LAP 4X18 ~~LOC~~+RFID (SPONGE) ×2 IMPLANT
SUT MNCRL AB 4-0 PS2 18 (SUTURE) ×1 IMPLANT
SUT VIC AB 2-0 SH 27 (SUTURE) ×2
SUT VIC AB 2-0 SH 27XBRD (SUTURE) IMPLANT
SUT VIC AB 3-0 SH 27 (SUTURE) ×2
SUT VIC AB 3-0 SH 27X BRD (SUTURE) IMPLANT
SYR BULB EAR ULCER 3OZ GRN STR (SYRINGE) IMPLANT
SYR CONTROL 10ML LL (SYRINGE) ×2 IMPLANT
TOWEL GREEN STERILE FF (TOWEL DISPOSABLE) ×2 IMPLANT
TUBE CONNECTING 20X1/4 (TUBING) IMPLANT
YANKAUER SUCT BULB TIP NO VENT (SUCTIONS) IMPLANT

## 2021-11-20 NOTE — Anesthesia Procedure Notes (Signed)
Procedure Name: LMA Insertion ?Date/Time: 11/20/2021 1:55 PM ?Performed by: Ronnette Hila, CRNA ?Pre-anesthesia Checklist: Patient identified, Emergency Drugs available, Suction available and Patient being monitored ?Patient Re-evaluated:Patient Re-evaluated prior to induction ?Oxygen Delivery Method: Circle system utilized ?Preoxygenation: Pre-oxygenation with 100% oxygen ?Induction Type: IV induction ?Ventilation: Mask ventilation without difficulty ?LMA: LMA inserted ?LMA Size: 4.0 ?Number of attempts: 1 ?Airway Equipment and Method: Bite block ?Placement Confirmation: positive ETCO2 ?Tube secured with: Tape ?Dental Injury: Teeth and Oropharynx as per pre-operative assessment  ? ? ? ? ?

## 2021-11-20 NOTE — Interval H&P Note (Signed)
History and Physical Interval Note:no change in H and P ? ?11/20/2021 ?11:50 AM ? ?Gail Brewer  has presented today for surgery, with the diagnosis of ABDOMINAL WALL MASS.  The various methods of treatment have been discussed with the patient and family. After consideration of risks, benefits and other options for treatment, the patient has consented to  Procedure(s): ?EXCISION RIGHT LOWER ABDOMINAL WALL MASS (Right) as a surgical intervention.  The patient's history has been reviewed, patient examined, no change in status, stable for surgery.  I have reviewed the patient's chart and labs.  Questions were answered to the patient's satisfaction.   ? ? ?Abigail Miyamoto ? ? ?

## 2021-11-20 NOTE — Anesthesia Preprocedure Evaluation (Signed)
Anesthesia Evaluation  ?Patient identified by MRN, date of birth, ID band ?Patient awake ? ? ? ?Reviewed: ?Allergy & Precautions, NPO status , Patient's Chart, lab work & pertinent test results ? ?Airway ?Mallampati: II ? ?TM Distance: >3 FB ?Neck ROM: Full ? ? ? Dental ? ?(+) Dental Advisory Given ?  ?Pulmonary ?former smoker,  ?  ?breath sounds clear to auscultation ? ? ? ? ? ? Cardiovascular ?negative cardio ROS ? ? ?Rhythm:Regular Rate:Normal ? ? ?  ?Neuro/Psych ?negative neurological ROS ?   ? GI/Hepatic ?negative GI ROS, Neg liver ROS,   ?Endo/Other  ?Morbid obesity ? Renal/GU ?negative Renal ROS  ? ?  ?Musculoskeletal ? ? Abdominal ?  ?Peds ? Hematology ?negative hematology ROS ?(+)   ?Anesthesia Other Findings ? ? Reproductive/Obstetrics ? ?  ? ? ? ? ? ? ? ? ? ? ? ? ? ?  ?  ? ? ? ? ? ? ? ? ?Anesthesia Physical ?Anesthesia Plan ? ?ASA: 3 ? ?Anesthesia Plan: General  ? ?Post-op Pain Management: Tylenol PO (pre-op)* and Toradol IV (intra-op)*  ? ?Induction: Intravenous ? ?PONV Risk Score and Plan: 3 and Midazolam, Dexamethasone and Ondansetron ? ?Airway Management Planned: LMA ? ?Additional Equipment: None ? ?Intra-op Plan:  ? ?Post-operative Plan: Extubation in OR ? ?Informed Consent: I have reviewed the patients History and Physical, chart, labs and discussed the procedure including the risks, benefits and alternatives for the proposed anesthesia with the patient or authorized representative who has indicated his/her understanding and acceptance.  ? ? ? ?Dental advisory given ? ?Plan Discussed with: CRNA ? ?Anesthesia Plan Comments:   ? ? ? ? ? ? ?Anesthesia Quick Evaluation ? ?

## 2021-11-20 NOTE — Transfer of Care (Signed)
Immediate Anesthesia Transfer of Care Note ? ?Patient: Gail Brewer ? ?Procedure(s) Performed: EXCISION RIGHT LOWER ABDOMINAL WALL MASS (Right: Abdomen) ? ?Patient Location: PACU ? ?Anesthesia Type:General ? ?Level of Consciousness: awake, alert  and oriented ? ?Airway & Oxygen Therapy: Patient Spontanous Breathing and Patient connected to face mask oxygen ? ?Post-op Assessment: Report given to RN and Post -op Vital signs reviewed and stable ? ?Post vital signs: Reviewed and stable ? ?Last Vitals:  ?Vitals Value Taken Time  ?BP    ?Temp    ?Pulse 76 11/20/21 1437  ?Resp    ?SpO2 96 % 11/20/21 1437  ?Vitals shown include unvalidated device data. ? ?Last Pain:  ?Vitals:  ? 11/20/21 1136  ?TempSrc: Oral  ?PainSc: 8   ?   ? ?Patients Stated Pain Goal: 3 (11/20/21 1136) ? ?Complications: No notable events documented. ?

## 2021-11-20 NOTE — Op Note (Signed)
EXCISION RIGHT LOWER ABDOMINAL WALL MASS  Procedure Note ? ?Tonia Brooms ?11/20/2021 ? ? ?Pre-op Diagnosis: ABDOMINAL WALL MASS ?    ?Post-op Diagnosis: Same ? ?Procedure(s): ?EXCISION RIGHT LOWER ABDOMINAL WALL MASS (6 cm x 4 cm) ? ?Surgeon(s): ?Abigail Miyamoto, MD ?Reine Just, PA-C ? ?Anesthesia: General ? ?Staff:  ?Circulator: Patrice Paradise, RN ?Scrub Person: Ward, Lanetta Inch ? ?Estimated Blood Loss: Minimal ?              ?Indications: This is a 46 year old female has been having pain in the right lower quadrant along her old C-section scar from a mass which has been slowly getting larger.  It feels like it is in the abdominal wall physical examination.  The decision was made to proceed to the operating room for excision ? ?Findings: The patient was found to have a firm mass measuring approximately centimeters by 4 cm above her fascia.  It was difficult to tell whether this represented calcified fat necrosis versus an endometrioma.  Her C-section scar along the fascia was firm the entire length ? ?Procedure: The patient was brought to operating identifies correct patient.  She is placed upon the operating table general anesthesia was induced.  Her abdomen was then prepped and draped in usual sterile fashion.  The palpable mass was just above the right side of her C-section incision laterally.  I anesthetized the skin with Marcaine and then made an incision through previous car with a scalpel.  I then dissected down to subcutaneous tissue and superiorly toward the mass with the cautery.  The mass was in the subcutaneous tissue and I excised in its entirety peeling it right off of the underlying fascia and old C-section scar.  The mass was then sent to pathology for evaluation.  Again it measured 6 cm x 4 cm in size.  The underlying fascia was intact.  The fascia self was fairly scarred and firm across the entire length of the visible old C-section scar.  I anesthetized the wound further with  Marcaine.  Hemostasis was achieved with the cautery.  The subcutaneous tissue was then closed with interrupted 3-0 Vicryl sutures and the skin was closed with running 4-0 Monocryl.  Dermabond was then applied.  The patient tolerated the procedure well.  All the counts were correct at the end of the procedure.  The patient was then extubated in the operating room and taken in a stable condition to the recovery room. ?        ? ?Abigail Miyamoto  ? ?Date: 11/20/2021  Time: 2:22 PM ? ? ? ?

## 2021-11-20 NOTE — Discharge Instructions (Addendum)
Ok to shower starting tomorrow ? ?No lifting more than 15 to 20 pounds for 2 weeks ? ?Ice pack, tylenol, and ibuprofen also for pain ? ? ?Post Anesthesia Home Care Instructions ? ?Activity: ?Get plenty of rest for the remainder of the day. A responsible individual must stay with you for 24 hours following the procedure.  ?For the next 24 hours, DO NOT: ?-Drive a car ?-Advertising copywriter ?-Drink alcoholic beverages ?-Take any medication unless instructed by your physician ?-Make any legal decisions or sign important papers. ? ?Meals: ?Start with liquid foods such as gelatin or soup. Progress to regular foods as tolerated. Avoid greasy, spicy, heavy foods. If nausea and/or vomiting occur, drink only clear liquids until the nausea and/or vomiting subsides. Call your physician if vomiting continues. ? ?Special Instructions/Symptoms: ?Your throat may feel dry or sore from the anesthesia or the breathing tube placed in your throat during surgery. If this causes discomfort, gargle with warm salt water. The discomfort should disappear within 24 hours. ? ?If you had a scopolamine patch placed behind your ear for the management of post- operative nausea and/or vomiting: ? ?1. The medication in the patch is effective for 72 hours, after which it should be removed.  Wrap patch in a tissue and discard in the trash. Wash hands thoroughly with soap and water. ?2. You may remove the patch earlier than 72 hours if you experience unpleasant side effects which may include dry mouth, dizziness or visual disturbances. ?3. Avoid touching the patch. Wash your hands with soap and water after contact with the patch. ?   ? ?

## 2021-11-21 ENCOUNTER — Encounter (HOSPITAL_BASED_OUTPATIENT_CLINIC_OR_DEPARTMENT_OTHER): Payer: Self-pay | Admitting: Surgery

## 2021-11-21 NOTE — Anesthesia Postprocedure Evaluation (Signed)
Anesthesia Post Note ? ?Patient: BENEDETTA WIDMEYER ? ?Procedure(s) Performed: EXCISION RIGHT LOWER ABDOMINAL WALL MASS (Right: Abdomen) ? ?  ? ?Patient location during evaluation: PACU ?Anesthesia Type: General ?Level of consciousness: awake and alert ?Pain management: pain level controlled ?Vital Signs Assessment: post-procedure vital signs reviewed and stable ?Respiratory status: spontaneous breathing, nonlabored ventilation, respiratory function stable and patient connected to nasal cannula oxygen ?Cardiovascular status: blood pressure returned to baseline and stable ?Postop Assessment: no apparent nausea or vomiting ?Anesthetic complications: no ? ? ?No notable events documented. ? ?Last Vitals:  ?Vitals:  ? 11/20/21 1515 11/20/21 1737  ?BP: 130/83 135/88  ?Pulse: 64 67  ?Resp: 19 16  ?Temp:  36.7 ?C  ?SpO2: 96% 96%  ?  ?Last Pain:  ?Vitals:  ? 11/20/21 1737  ?TempSrc:   ?PainSc: 3   ? ? ?  ?  ?  ?  ?  ?  ? ?Suzette Battiest E ? ? ? ? ?

## 2021-11-22 LAB — SURGICAL PATHOLOGY

## 2021-11-27 ENCOUNTER — Telehealth: Payer: Self-pay

## 2021-11-27 NOTE — Telephone Encounter (Signed)
Tried to reach patient, but was unable to leave message. Sent message via Mychart inquiring if she wanted to set up a follow up appointment with Dr. Domenica Reamer in regards to [facility] breast reduction surgery.  ?

## 2021-12-23 ENCOUNTER — Telehealth: Payer: Self-pay | Admitting: *Deleted

## 2021-12-23 NOTE — Telephone Encounter (Signed)
Message received from pt with question for Dr. Domenica Reamer regarding adding on a tummy tuck when she has breast surgery in July. Discussed with Dr. Domenica Reamer who requested pt come in for a consult. Message forwarded to front desk for scheduling.

## 2021-12-30 ENCOUNTER — Ambulatory Visit (INDEPENDENT_AMBULATORY_CARE_PROVIDER_SITE_OTHER): Payer: 59 | Admitting: Plastic Surgery

## 2021-12-30 ENCOUNTER — Encounter: Payer: Self-pay | Admitting: Plastic Surgery

## 2021-12-30 VITALS — BP 128/85 | HR 67 | Wt 245.6 lb

## 2021-12-30 DIAGNOSIS — R21 Rash and other nonspecific skin eruption: Secondary | ICD-10-CM | POA: Diagnosis not present

## 2021-12-30 DIAGNOSIS — M793 Panniculitis, unspecified: Secondary | ICD-10-CM

## 2021-12-30 NOTE — Progress Notes (Signed)
   Referring Provider No referring provider defined for this encounter.   CC:  Abdominal pannus causing rashes  Gail Brewer is an 46 y.o. female.  HPI: Patient is a 46 year old who is scheduled for breast reduction.  She is now also interested in abdominal panniculectomy.  She notes that no matter how much she works out at [employer] she is still has a skin fold at the site of her C-section scar.  Review of Systems General: No fever, no chills  Physical Exam    12/30/2021    8:40 AM 11/20/2021    5:37 PM 11/20/2021    3:15 PM  Vitals with BMI  Weight 245 lbs 10 oz    Systolic 128 135 606  Diastolic 85 88 83  Pulse 67 67 64    General:  No acute distress,  Alert and oriented, Non-Toxic, Normal speech and affect Abdomen: Patient has a significant pannus in her suprapubic area with moisture and skin irritation underneath.  C-section scar is visible but well-healed.  Assessment/Plan Patient is a good candidate for abdominal panniculectomy.  This can be scheduled the same time as breast reduction if she elects to proceed.  Abdominal panniculectomy may improve the patient's rashes that there is recurring in her skin fold.  Gail Brewer 12/30/2021, 1:06 PM

## 2022-01-02 ENCOUNTER — Telehealth: Payer: Self-pay | Admitting: Plastic Surgery

## 2022-01-02 NOTE — Telephone Encounter (Signed)
Called patient to advise her that the authorization for the panniculectomy has been denied due to the fact the excess skin does not hang below her pubis and that there is no documentation of chronic intertrigo/rashes. Patient would like a quote for this procedure and will let our office know if she wants to have them done together which would require another date being selected since the location would change. Patient understands I will not change her current surgery (bilateral breast reduction) until I have heard from her. Sending quote to patient via mychart.

## 2022-01-17 ENCOUNTER — Telehealth: Payer: Self-pay

## 2022-01-17 NOTE — Telephone Encounter (Signed)
Called patient, inquired if she wanted to add on panniculectomy or abdominoplasty (cosmetic) to her BL breast reduction surgery scheduled 7/26. At this time, she only wants the breast reduction surgery. Patient is planing to appeal panniculectomy denial and see her PCP.

## 2022-01-27 ENCOUNTER — Encounter: Payer: Self-pay | Admitting: Physician Assistant

## 2022-01-27 ENCOUNTER — Ambulatory Visit (INDEPENDENT_AMBULATORY_CARE_PROVIDER_SITE_OTHER): Payer: 59 | Admitting: Physician Assistant

## 2022-01-27 VITALS — HR 78 | Ht 65.0 in | Wt 237.4 lb

## 2022-01-27 DIAGNOSIS — N62 Hypertrophy of breast: Secondary | ICD-10-CM

## 2022-01-27 MED ORDER — OXYCODONE HCL 5 MG PO TABS: 5.0000 mg | ORAL_TABLET | Freq: Four times a day (QID) | ORAL | 0 refills | Status: AC | PRN

## 2022-01-27 MED ORDER — ONDANSETRON 4 MG PO TBDP: 4.0000 mg | ORAL_TABLET | Freq: Three times a day (TID) | ORAL | 0 refills | Status: DC | PRN

## 2022-01-27 NOTE — Progress Notes (Addendum)
Patient ID: Gail Brewer, female    DOB: 23-Mar-1976, 45 y.o.   MRN: [id number]  Chief Complaint  Patient presents with   Pre-op Exam      ICD-10-CM   1. Breast hypertrophy  N62        History of Present Illness: Gail Brewer is a 46 y.o.  female  with a history of macromastia.  She presents for preoperative evaluation for upcoming procedure, bilateral breast reduction with possible free nipple graft, scheduled for 02/12/2022 with Dr.  Domenica Reamer .  The patient has not had problems with anesthesia.  She recently had an endometrioma excised and denies any complications from anesthesia.  She confirms that she is a G cup and would like to be a DDD or DDD cup postoperatively.  She does not take any prescription medications daily, but does state that she will take vitamins.  She will hold these vitamins 1 week prior to surgery.  NKDA.  No history of keloiding.  Denies any personal or family history of blood clots or clotting disorder.  No history of CVA, MI, or cancer.  Her blood pressure was mildly elevated here today, but she reports that it is commonly well controlled 120/80. Repeat STN today is 41 cm on the R and 46 cm on the L.  Discussed possibility of free nipple graft to which patient is understanding and agreeable.  Summary of Previous Visit: She was seen for initial consult by Dr. Domenica Reamer in office on 09/30/2021.  At that time, complained of chronic upper back and neck discomfort in the context of large breasts.  BMI equals 41 kg/m.  Preoperative bra size equals G cup.  Negative mammogram 07/2021.  STN 42 cm on the right, 45 cm on the left.  Estimated excess breast tissue removed at time of surgery equals greater than 1000 g from each breast.  Job: Credit specialist, predominantly a computer-based position.  Discussed 2 weeks FMLA.  PMH Significant for: Macromastia, endometriosis.   Past Medical History: Allergies: Allergies  Allergen Reactions   Metronidazole Other (See  Comments)    Yeast infection    Current Medications:  Current Outpatient Medications:    ondansetron (ZOFRAN-ODT) 4 MG disintegrating tablet, Take 1 tablet (4 mg total) by mouth every 8 (eight) hours as needed for nausea or vomiting., Disp: 20 tablet, Rfl: 0   OVER THE COUNTER MEDICATION, Biotin-Take 1 tablet by mouth daily., Disp: , Rfl:    oxyCODONE (ROXICODONE) 5 MG immediate release tablet, Take 1 tablet (5 mg total) by mouth every 6 (six) hours as needed for up to 5 days for severe pain., Disp: 20 tablet, Rfl: 0   traMADol (ULTRAM) 50 MG tablet, Take 50 mg by mouth every 6 (six) hours as needed., Disp: , Rfl:   Past Medical Problems: Past Medical History:  Diagnosis Date   Anemia    Anxiety    Depression    Headache(784.0)    Heart murmur    asymptomatic    Past Surgical History: Past Surgical History:  Procedure Laterality Date   CESAREAN SECTION     x 2   CESAREAN SECTION  07/24/2011   Procedure: CESAREAN SECTION;  Surgeon: Fortino Sic, MD;  Location: [facility];  Service: Gynecology;  Laterality: N/A;  Repeat Cesarian Section   RESECTION OF ABDOMINAL MASS Right 11/20/2021   Procedure: EXCISION RIGHT LOWER ABDOMINAL WALL MASS;  Surgeon: Abigail Miyamoto, MD;  Location: [facility];  Service: General;  Laterality:  Right;    Social History: Social History   Socioeconomic History   Marital status: Divorced    Spouse name: Not on file   Number of children: Not on file   Years of education: Not on file   Highest education level: Not on file  Occupational History   Not on file  Tobacco Use   Smoking status: Former    Types: Cigars   Smokeless tobacco: Never  Vaping Use   Vaping Use: Never used  Substance and Sexual Activity   Alcohol use: Yes    Alcohol/week: 1.0 standard drink of alcohol    Types: 1 Standard drinks or equivalent per week   Drug use: No   Sexual activity: Not Currently    Birth control/protection: None  Other Topics Concern    Not on file  Social History Narrative   Not on file   Social Determinants of Health   Financial Resource Strain: Not on file  Food Insecurity: Not on file  Transportation Needs: Not on file  Physical Activity: Not on file  Stress: Not on file  Social Connections: Not on file  Intimate Partner Violence: Not on file    Family History: Family History  Problem Relation Age of Onset   Kidney failure Mother    Breast cancer Paternal Grandmother     Review of Systems: ROS Denies any recent chest pain, difficulty breathing, leg swelling, or fevers.  Physical Exam: Vital Signs Pulse 78   Ht 5\' 5"  (1.651 m)   Wt 237 lb 6.4 oz (107.7 kg)   LMP 01/15/2022 (Exact Date)   SpO2 98%   BMI 39.51 kg/m   Physical Exam Constitutional:      General: Not in acute distress.    Appearance: Normal appearance. Not ill-appearing.  HENT:     Head: Normocephalic and atraumatic.  Eyes:     Pupils: Pupils are equal, round. Cardiovascular:     Rate and Rhythm: Normal rate.    Pulses: Normal pulses.  Pulmonary:     Effort: No respiratory distress or increased work of breathing.  Speaks in full sentences. Abdominal:     General: Abdomen is flat. No distension.   Musculoskeletal: Normal range of motion. No lower extremity swelling or edema. No varicosities. Skin:    General: Skin is warm and dry.     Findings: No erythema or rash.  Neurological:     Mental Status: Alert and oriented to person, place, and time.  Psychiatric:        Mood and Affect: Mood normal.        Behavior: Behavior normal.    Assessment/Plan: The patient is scheduled for bilateral breast reduction with Dr. 01/17/2022.  Risks, benefits, and alternatives of procedure discussed, questions answered and consent obtained.    Smoking Status: Non-smoker. Last Mammogram: 07/2021; Results: BI-RADS Category 1: Negative.  Caprini Score: 4; Risk Factors include: Age, BMI greater than 25, and length of planned surgery.  Recommendation for mechanical prophylaxis. Encourage early ambulation.   Pictures obtained: 09/30/2021  Post-op Rx sent to pharmacy: Oxycodone and Zofran.  Patient was provided with the General Surgical Risk consent document and Pain Medication Agreement prior to their appointment.  They had adequate time to read through the risk consent documents and Pain Medication Agreement. We also discussed them in person together during this preop appointment. All of their questions were answered to their satisfaction.  Recommended calling if they have any further questions.  Risk consent form and Pain Medication Agreement  to be scanned into patient's chart.  The risk that can be encountered with breast reduction were discussed and include the following but not limited to these:  Breast asymmetry, fluid accumulation, firmness of the breast, inability to breast feed, loss of nipple or areola, skin loss, decrease or no nipple sensation, fat necrosis of the breast tissue, bleeding, infection, healing delay.  There are risks of anesthesia, changes to skin sensation and injury to nerves or blood vessels.  The muscle can be temporarily or permanently injured.  You may have an allergic reaction to tape, suture, glue, blood products which can result in skin discoloration, swelling, pain, skin lesions, poor healing.  Any of these can lead to the need for revisonal surgery or stage procedures.  A reduction has potential to interfere with diagnostic procedures.  Nipple or breast piercing can increase risks of infection.  This procedure is best done when the breast is fully developed.  Changes in the breast will continue to occur over time.  Pregnancy can alter the outcomes of previous breast reduction surgery, weight gain and weigh loss can also effect the long term appearance.   We discussed the possibility of amputation/free nipple graft technique due to the length of her STN.  She is understanding of the possibility that we  would need to transition from a pedicle technique to a free nipple graft technique intraoperatively.  We discussed the risks associated with free nipple graft breast reductions, including but not limited to failure of the graft, partial loss of the graft, loss of sensation of bilateral nipple areola, complete loss of the nipple areola graft, inability to breast-feed, postoperative wounds, ongoing wound care.  We also discussed the risks associated with the pedicle technique.  We discussed that with the pedicle technique she could develop nipple areolar necrosis which would result in loss of the nipple, this would also result in ongoing wound care and possible changes in the shape of her breast.     Electronically signed by: Evelena Leyden, PA-C 01/27/2022 10:57 AM

## 2022-01-31 DIAGNOSIS — Z719 Counseling, unspecified: Secondary | ICD-10-CM

## 2022-01-31 NOTE — Pre-Procedure Instructions (Signed)
Surgical Instructions    Your procedure is scheduled on Wednesday 02/12/22.   Report to [facility] Main Entrance "A" at 08:15 A.M., then check in with the Admitting office.  Call this number if you have problems the morning of surgery:  [phone]   If you have any questions prior to your surgery date call [phone]: Open Monday-Friday 8am-4pm    Remember:  Do not eat after midnight the night before your surgery  You may drink clear liquids until 07:15 A.M. the morning of your surgery.   Clear liquids allowed are: Water, Non-Citrus Juices (without pulp), Carbonated Beverages, Clear Tea, Black Coffee ONLY (NO MILK, CREAM OR POWDERED CREAMER of any kind), and Gatorade    Take these medicines the morning of surgery with A SIP OF WATER:   NONE   Take these medicines if needed:   ondansetron (ZOFRAN-ODT)  oxyCODONE (ROXICODONE)   traMADol (ULTRAM)   As of today, STOP taking any Aspirin (unless otherwise instructed by your surgeon) Aleve, Naproxen, Ibuprofen, Motrin, Advil, Goody's, BC's, all herbal medications, fish oil, and all vitamins.           Do not wear jewelry or makeup Do not wear lotions, powders, perfumes, or deodorant. Do not shave 48 hours prior to surgery.   Do not bring valuables to the hospital. Do not wear nail polish, gel polish, artificial nails, or any other type of covering on natural nails (fingers and toes) If you have artificial nails or gel coating that need to be removed by a nail salon, please have this removed prior to surgery. Artificial nails or gel coating may interfere with anesthesia's ability to adequately monitor your vital signs.  [facility] is not responsible for any belongings or valuables. .   Do NOT Smoke (Tobacco/Vaping)  24 hours prior to your procedure  If you use a CPAP at night, you may bring your mask for your overnight stay.   Contacts, glasses, hearing aids, dentures or partials may not be worn into surgery, please bring cases  for these belongings   For patients admitted to the hospital, discharge time will be determined by your treatment team.   Patients discharged the day of surgery will not be allowed to drive home, and someone needs to stay with them for 24 hours.   SURGICAL WAITING ROOM VISITATION Patients having surgery or a procedure may have no more than 2 support people in the waiting area - these visitors may rotate.   Children under the age of 63 must have an adult with them who is not the patient. If the patient needs to stay at the hospital during part of their recovery, the visitor guidelines for inpatient rooms apply. Pre-op nurse will coordinate an appropriate time for 1 support person to accompany patient in pre-op.  This support person may not rotate.   Please refer to the [facility] website for the visitor guidelines for Inpatients (after your surgery is over and you are in a regular room).    Special instructions:    Oral Hygiene is also important to reduce your risk of infection.  Remember - BRUSH YOUR TEETH THE MORNING OF SURGERY WITH YOUR REGULAR TOOTHPASTE   [facility]- Preparing For Surgery  Before surgery, you can play an important role. Because skin is not sterile, your skin needs to be as free of germs as possible. You can reduce the number of germs on your skin by washing with CHG (chlorahexidine gluconate) Soap before surgery.  CHG is an  antiseptic cleaner which kills germs and bonds with the skin to continue killing germs even after washing.     Please do not use if you have an allergy to CHG or antibacterial soaps. If your skin becomes reddened/irritated stop using the CHG.  Do not shave (including legs and underarms) for at least 48 hours prior to first CHG shower. It is OK to shave your face.  Please follow these instructions carefully.     Shower the NIGHT BEFORE SURGERY and the MORNING OF SURGERY with CHG Soap.   If you chose to wash your hair, wash your hair first as  usual with your normal shampoo. After you shampoo, rinse your hair and body thoroughly to remove the shampoo.  Then [employer] and genitals (private parts) with your normal soap and rinse thoroughly to remove soap.  After that Use CHG Soap as you would any other liquid soap. You can apply CHG directly to the skin and wash gently with a scrungie or a clean washcloth.   Apply the CHG Soap to your body ONLY FROM THE NECK DOWN.  Do not use on open wounds or open sores. Avoid contact with your eyes, ears, mouth and genitals (private parts). Wash Face and genitals (private parts)  with your normal soap.   Wash thoroughly, paying special attention to the area where your surgery will be performed.  Thoroughly rinse your body with warm water from the neck down.  DO NOT shower/wash with your normal soap after using and rinsing off the CHG Soap.  Pat yourself dry with a CLEAN TOWEL.  Wear CLEAN PAJAMAS to bed the night before surgery  Place CLEAN SHEETS on your bed the night before your surgery  DO NOT SLEEP WITH PETS.   Day of Surgery:  Take a shower with CHG soap. Wear Clean/Comfortable clothing the morning of surgery Do not apply any deodorants/lotions.   Remember to brush your teeth WITH YOUR REGULAR TOOTHPASTE.    If you received a COVID test during your pre-op visit, it is requested that you wear a mask when out in public, stay away from anyone that may not be feeling well, and notify your surgeon if you develop symptoms. If you have been in contact with anyone that has tested positive in the last 10 days, please notify your surgeon.    Please read over the following fact sheets that you were given.

## 2022-02-03 ENCOUNTER — Inpatient Hospital Stay (HOSPITAL_COMMUNITY)
Admission: RE | Admit: 2022-02-03 | Discharge: 2022-02-03 | Disposition: A | Discharge status: Home / Self Care | Payer: 59 | Source: Ambulatory Visit

## 2022-02-03 ENCOUNTER — Encounter (HOSPITAL_COMMUNITY): Payer: Self-pay | Admitting: Emergency Medicine

## 2022-02-03 NOTE — Progress Notes (Signed)
Contacted pt regarding missed PAT appt. Pt states that she is cancelling her surgery and will not be coming in today. Pt advised to call Dr. Irene Shipper office and make them aware of the cancellation as soon as possible. Pt verbalized understanding.   Viviano Simas, RN

## 2022-02-05 ENCOUNTER — Telehealth: Payer: Self-pay

## 2022-02-05 NOTE — Telephone Encounter (Signed)
Patient called 02/04/22, LMVM requesting to cancel BL breast reduction surgery scheduled on 02/12/22 at [facility]. She wants to get a 2nd opinion for surgery before moving foward. Tried to call patient back VM is full, sent message via MyChart confirming surgery is cancelled. Called [facility] surgery schedulers, spoke with Penni Bombard requested to cancel  [phone].

## 2022-02-12 ENCOUNTER — Ambulatory Visit: Admit: 2022-02-12 | Payer: 59 | Admitting: Plastic Surgery

## 2022-02-12 SURGERY — MAMMOPLASTY, REDUCTION
Anesthesia: General | Site: Breast | Laterality: Bilateral

## 2022-02-21 ENCOUNTER — Encounter: Payer: 59 | Admitting: Plastic Surgery

## 2022-02-24 DIAGNOSIS — R102 Pelvic and perineal pain: Secondary | ICD-10-CM | POA: Diagnosis not present

## 2022-02-24 DIAGNOSIS — Z87891 Personal history of nicotine dependence: Secondary | ICD-10-CM | POA: Diagnosis not present

## 2022-02-24 DIAGNOSIS — Z6838 Body mass index (BMI) 38.0-38.9, adult: Secondary | ICD-10-CM | POA: Diagnosis not present

## 2022-02-24 DIAGNOSIS — M549 Dorsalgia, unspecified: Secondary | ICD-10-CM | POA: Diagnosis not present

## 2022-04-14 DIAGNOSIS — R635 Abnormal weight gain: Secondary | ICD-10-CM | POA: Diagnosis not present

## 2022-04-14 DIAGNOSIS — R102 Pelvic and perineal pain: Secondary | ICD-10-CM | POA: Diagnosis not present

## 2022-04-24 DIAGNOSIS — M549 Dorsalgia, unspecified: Secondary | ICD-10-CM | POA: Diagnosis not present

## 2022-04-24 DIAGNOSIS — M25511 Pain in right shoulder: Secondary | ICD-10-CM | POA: Diagnosis not present

## 2022-04-24 DIAGNOSIS — M542 Cervicalgia: Secondary | ICD-10-CM | POA: Diagnosis not present

## 2022-04-24 DIAGNOSIS — M25512 Pain in left shoulder: Secondary | ICD-10-CM | POA: Diagnosis not present

## 2022-04-24 DIAGNOSIS — G8929 Other chronic pain: Secondary | ICD-10-CM | POA: Diagnosis not present

## 2022-04-24 DIAGNOSIS — N62 Hypertrophy of breast: Secondary | ICD-10-CM | POA: Diagnosis not present

## 2022-05-08 ENCOUNTER — Encounter: Payer: Self-pay | Admitting: Obstetrics and Gynecology

## 2022-05-08 ENCOUNTER — Ambulatory Visit (INDEPENDENT_AMBULATORY_CARE_PROVIDER_SITE_OTHER): Payer: 59 | Admitting: Obstetrics and Gynecology

## 2022-05-08 ENCOUNTER — Other Ambulatory Visit (HOSPITAL_COMMUNITY)
Admission: RE | Admit: 2022-05-08 | Discharge: 2022-05-08 | Disposition: A | Discharge status: Home / Self Care | Payer: 59 | Source: Ambulatory Visit | Attending: Obstetrics and Gynecology | Admitting: Obstetrics and Gynecology

## 2022-05-08 VITALS — BP 129/80 | HR 70 | Ht 65.0 in | Wt 234.0 lb

## 2022-05-08 DIAGNOSIS — Z124 Encounter for screening for malignant neoplasm of cervix: Secondary | ICD-10-CM | POA: Insufficient documentation

## 2022-05-08 DIAGNOSIS — N80C19 Endometriosis of the anterior abdominal wall, unspecified depth: Secondary | ICD-10-CM | POA: Diagnosis not present

## 2022-05-08 DIAGNOSIS — Z01419 Encounter for gynecological examination (general) (routine) without abnormal findings: Secondary | ICD-10-CM

## 2022-05-08 DIAGNOSIS — L299 Pruritus, unspecified: Secondary | ICD-10-CM

## 2022-05-08 MED ORDER — SLYND 4 MG PO TABS: 4.0000 mg | ORAL_TABLET | Freq: Every day | ORAL | 3 refills | Status: DC

## 2022-05-08 MED ORDER — CLOTRIMAZOLE-BETAMETHASONE 1-0.05 % EX CREA: 1.0000 | TOPICAL_CREAM | Freq: Two times a day (BID) | CUTANEOUS | 0 refills | Status: AC

## 2022-05-08 NOTE — Patient Instructions (Addendum)
[email or web address]  [facility]

## 2022-05-08 NOTE — Progress Notes (Signed)
Pt is in the office reporting pelvic pain LMP 04/16/22 Pt reports last pap normal [facility] 2021 [facility] Nov 20 2021 she had surgery related to endometriosis, pt states 3-98mths after surgery she felt a "growth" in the same spot and has pain  the week before, after, and during her cycle. She describes pain as "shooting" 5-6/10. Takes OTC aleve and tramadol for some relief Pain is worse with prolonged sitting or standing

## 2022-05-08 NOTE — Progress Notes (Signed)
CLINIC ENCOUNTER NOTE  History:  46 y.o. [id number] here today for abdominal wall endometriosis. Return of abdominal wall pain after surgery. Initial delay in diagnosis due to back and forth between gen surg and gyn. Interested in hormonal treatment for suppression to avoid repeat surgery. Initial surgery removed large abdominal wall mass and postop course uneventful. Stopped having menstrual cramps years ago, and only has abdominal wall and lower back pain. Pain is in abdominal wall to the back 8-9/10 when it happens. Naproxen + tramadol gives some relief. Initally after surgery improvement in pain, with August menses noticed some resumption of pain. Now getting to the week before, during, and after will have pain. Menses 4-5 days in duration. Has completed childbearing.    Past Medical History:  Diagnosis Date   Anemia    Anxiety    Depression    Headache(784.0)    Heart murmur    asymptomatic    Past Surgical History:  Procedure Laterality Date   CESAREAN SECTION     x 2   CESAREAN SECTION  07/24/2011   Procedure: CESAREAN SECTION;  Surgeon: Fortino Sic, MD;  Location: [facility];  Service: Gynecology;  Laterality: N/A;  Repeat Cesarian Section   RESECTION OF ABDOMINAL MASS Right 11/20/2021   Procedure: EXCISION RIGHT LOWER ABDOMINAL WALL MASS;  Surgeon: Abigail Miyamoto, MD;  Location: [facility];  Service: General;  Laterality: Right;    The following portions of the patient's history were reviewed and updated as appropriate: allergies, current medications, past family history, past medical history, past social history, past surgical history and problem list.    Review of Systems:  Pertinent items are noted in HPI. Comprehensive review of systems was otherwise negative.   Objective:  Physical Exam BP 129/80   Pulse 70   Ht 5\' 5"  (1.651 m)   Wt 234 lb (106.1 kg)   LMP 04/16/2022   BMI 38.94 kg/m    Physical Exam Vitals and nursing note reviewed. Exam  conducted with a chaperone present.  Constitutional:      Appearance: Normal appearance.  HENT:     Head: Normocephalic and atraumatic.  Cardiovascular:     Rate and Rhythm: Normal rate and regular rhythm.  Pulmonary:     Effort: Pulmonary effort is normal.     Breath sounds: Normal breath sounds.  Abdominal:       Comments: Well healed low transverse skin incision Firmness palpated super and inferior to the incision, ~4cm maximally in lateral direction, ?nodule measuring ~2cm vs generalized firmness and induration  No overlying skin changes, erythema, echymosis   Genitourinary:    General: Normal vulva.     Exam position: Lithotomy position.     Labia:        Right: No rash.        Left: No rash.      Urethra: No prolapse.     Vagina: Normal.     Cervix: Normal.     Uterus: Normal.      Adnexa: Right adnexa normal and left adnexa normal.     Comments: Pelvic floor nontender bilaterally No CMT Skin:    General: Skin is warm and dry.  Neurological:     General: No focal deficit present.     Mental Status: She is alert.  Psychiatric:        Mood and Affect: Mood normal.        Behavior: Behavior normal.        Thought  Content: Thought content normal.        Judgment: Judgment normal.    Labs and Imaging No results found.     Assessment & Plan:   1. Endometriosis of anterior abdominal wall Discussed pathophysiology of endometriosis and occurrence of abdominal wall endometriosis. Discusses hormonal menstrual suppression to control cyclical pain.  Discussed systemic hormonal suppression vs local suppression as endometriotic lesion and pain is focused within abdominal wall. Reviewed that area of induration may or may not reduce as suspect likely mix of scar tissue and endometriosis.  Will also order abdominal wall ultrasound to assess for defined collection vs generalized induration  Reviewed anesthesia/plastic surgery team may recommend brief hold of contraceptive during  perioperative period (has upcoming breast reduction surgery) - Drospirenone (SLYND) 4 MG TABS; Take 4 mg by mouth daily.  Dispense: 90 tablet; Refill: 3  2. Screening for cervical cancer Screening completed today  - Cervicovaginal ancillary only( [place]) - Cytology - PAP( [place])  3. Itching Lower abdominal wall itching. Keep area dry and apply cream sent to pharmacy  - clotrimazole-betamethasone (LOTRISONE) cream; Apply 1 Application topically 2 (two) times daily for 14 days.  Dispense: 28 g; Refill: 0  4. Well woman exam with routine gynecological exam Screening collected today, declined serum STI screening Discussed smoking cessation and anxiety management - Cervicovaginal ancillary only( [place]) - Cytology - PAP( [place])    Darliss Cheney, MD [facility] [place] [facility] [employer]

## 2022-05-09 LAB — CERVICOVAGINAL ANCILLARY ONLY
Bacterial Vaginitis (gardnerella): POSITIVE — AB
Candida Glabrata: NEGATIVE
Candida Vaginitis: NEGATIVE
Chlamydia: NEGATIVE
Comment: NEGATIVE
Comment: NEGATIVE
Comment: NEGATIVE
Comment: NEGATIVE
Comment: NEGATIVE
Comment: NORMAL
Neisseria Gonorrhea: NEGATIVE
Trichomonas: NEGATIVE

## 2022-05-09 NOTE — Addendum Note (Signed)
Addended by: Cindi Carbon on: 05/09/2022 08:37 AM   Modules accepted: Orders

## 2022-05-12 NOTE — Addendum Note (Signed)
Addended by: Cindi Carbon on: 05/12/2022 06:56 PM   Modules accepted: Orders

## 2022-05-13 ENCOUNTER — Other Ambulatory Visit: Payer: Self-pay | Admitting: Obstetrics and Gynecology

## 2022-05-13 DIAGNOSIS — N760 Acute vaginitis: Secondary | ICD-10-CM

## 2022-05-13 LAB — CYTOLOGY - PAP
Adequacy: ABSENT
Comment: NEGATIVE
Diagnosis: NEGATIVE
High risk HPV: NEGATIVE

## 2022-05-13 MED ORDER — CLINDAMYCIN PHOSPHATE 2 % VA CREA: 1.0000 | TOPICAL_CREAM | Freq: Every day | VAGINAL | 0 refills | Status: AC

## 2022-05-14 ENCOUNTER — Telehealth: Payer: Self-pay

## 2022-05-14 ENCOUNTER — Encounter: Payer: Self-pay | Admitting: Obstetrics and Gynecology

## 2022-05-14 NOTE — Telephone Encounter (Signed)
S/w pt and advised of results and rx sent. 

## 2022-05-26 ENCOUNTER — Ambulatory Visit (HOSPITAL_BASED_OUTPATIENT_CLINIC_OR_DEPARTMENT_OTHER): Payer: 59

## 2022-06-30 DIAGNOSIS — M542 Cervicalgia: Secondary | ICD-10-CM | POA: Diagnosis not present

## 2022-06-30 DIAGNOSIS — G8929 Other chronic pain: Secondary | ICD-10-CM | POA: Diagnosis not present

## 2022-06-30 DIAGNOSIS — M25511 Pain in right shoulder: Secondary | ICD-10-CM | POA: Diagnosis not present

## 2022-06-30 DIAGNOSIS — M549 Dorsalgia, unspecified: Secondary | ICD-10-CM | POA: Diagnosis not present

## 2022-06-30 DIAGNOSIS — M25512 Pain in left shoulder: Secondary | ICD-10-CM | POA: Diagnosis not present

## 2022-06-30 DIAGNOSIS — N62 Hypertrophy of breast: Secondary | ICD-10-CM | POA: Diagnosis not present

## 2022-06-30 NOTE — H&P (Signed)
Subjective:     Patient ID: Gail Brewer is a 46 y.o. female.   HPI   Returns for follow up discussion breast reduction. Has had prior consultation Dr. Meredith Mody, [place]. Current 38G. Reports over year history neck and back and shoulder pain. Reports numbness hands. Reports associated itching beneath breasts. Has tried specialty fitted bras, tramadol, weight loss as below, regular activity/exercises for over 3 month trial without relief. Notes she was scheduled for surgery with Dr. Domenica Reamer, at pre op visit informed having free nipple grafts.    Wt down 25 lb over last year, goal 200 lb. Wt stable since last visit here Oct 2023   MMG 1.30.23 normal. Denies FH breast or ovarian ca.   Works for [employer], in person. Lives with sons ages 24, 26, 71.   Review of Systems  Constitutional: Positive for fatigue.  Musculoskeletal: Positive for back pain and neck pain.  Skin: Positive for rash.       +itching    Remainder 12 point review negative    Objective:   Physical Exam Cardiovascular:     Rate and Rhythm: Normal rate and regular rhythm.     Heart sounds: Normal heart sounds.  Pulmonary:     Effort: Pulmonary effort is normal.     Breath sounds: Normal breath sounds.  Lymphadenopathy:     Upper Body:     Right upper body: No axillary adenopathy.     Left upper body: No axillary adenopathy.  Skin:    Comments: Fitzpatrick 6     +shoulder grooving Breasts: no masses, left<right volume SN to nipple R 41 L 45 cm BW R 24 L 26 cm Nipple to IMF R 17 L 19 cm      Assessment:     Macromastia  Chronic neck and back pain Shoulder pain    Plan:         Chronic neck and back pain, shoulder pain, that has failed conservative measures in setting of macromastia. No other cause of back pain noted. There is a reasonable likelihood that the symptoms are primarily due to macromastia; breast reduction surgery has reasonable expectation to improve symptoms.   Reviewed reduction with  anchor type scars, OP surgery, drains, post operative visits and limitations, recovery. Diminished sensation nipple and breast skin, risk of nipple loss, wound healing problems, asymmetry, incidental carcinoma, changes with wt gain/loss, aging, unacceptable cosmetic appearance reviewed. Reviewed significant weight loss post reduction will lead to early recurrent ptosis. Counseled her SN to nipple distance does place her at higher risk NAC compromise. Reviewed free nipple graft as possibility- in this setting NAC would have no sensation not stimulate permanently, and accompanying color changes. Counseled if she were to pursue surgery, can attempt surgery with preservation NAC on pedicle but decision may need to made intraoperatively to convert to FNG if any compromise. Reviewed even if NAC appears viable with traditional reduction at end of surgery, it is still possible for NAC to necrose over first several days post operative. Reviewed arterial compromise vs venous congestion both risk loss NAC.    Additional risks including but not limited to bleeding, hematoma, seroma, damage to adjacent structures, need for additional procedures, blood clots in lungs or legs reviewed.   Drain teaching completed. Patient has tramadol at home. Will provided Rx on day of surgery-patient request something stronger than tramadol.   Anticipate 1000 greduction from each breast. Cannot assure cup size.

## 2022-07-07 ENCOUNTER — Encounter (HOSPITAL_BASED_OUTPATIENT_CLINIC_OR_DEPARTMENT_OTHER): Payer: Self-pay | Admitting: Plastic Surgery

## 2022-07-07 ENCOUNTER — Other Ambulatory Visit: Payer: Self-pay

## 2022-07-08 ENCOUNTER — Encounter (HOSPITAL_BASED_OUTPATIENT_CLINIC_OR_DEPARTMENT_OTHER)
Admission: RE | Admit: 2022-07-08 | Discharge: 2022-07-08 | Disposition: A | Discharge status: Home / Self Care | Payer: 59 | Source: Ambulatory Visit | Attending: Plastic Surgery | Admitting: Plastic Surgery

## 2022-07-08 DIAGNOSIS — M549 Dorsalgia, unspecified: Secondary | ICD-10-CM | POA: Diagnosis not present

## 2022-07-08 DIAGNOSIS — M542 Cervicalgia: Secondary | ICD-10-CM | POA: Diagnosis not present

## 2022-07-08 DIAGNOSIS — M25519 Pain in unspecified shoulder: Secondary | ICD-10-CM | POA: Diagnosis not present

## 2022-07-08 DIAGNOSIS — N62 Hypertrophy of breast: Secondary | ICD-10-CM | POA: Diagnosis not present

## 2022-07-08 DIAGNOSIS — Z87891 Personal history of nicotine dependence: Secondary | ICD-10-CM | POA: Diagnosis not present

## 2022-07-08 DIAGNOSIS — G8929 Other chronic pain: Secondary | ICD-10-CM | POA: Diagnosis not present

## 2022-07-08 NOTE — Progress Notes (Deleted)
       Patient Instructions  The night before surgery:  No food after midnight. ONLY clear liquids after midnight  The day of surgery (if you do NOT have diabetes):  Drink ONE (1) Pre-Surgery Clear Ensure as directed.   This drink was given to you during your hospital  pre-op appointment visit. The pre-op nurse will instruct you on the time to drink the  Pre-Surgery Ensure depending on your surgery time. Finish the drink at the designated time by the pre-op nurse.  Nothing else to drink after completing the  Pre-Surgery Clear Ensure.  The day of surgery (if you have diabetes): Drink ONE (1) Gatorade 2 (G2) as directed. This drink was given to you during your hospital  pre-op appointment visit.  The pre-op nurse will instruct you on the time to drink the   Gatorade 2 (G2) depending on your surgery time. Color of the Gatorade may vary. Red is not allowed. Nothing else to drink after completing the  Gatorade 2 (G2).         If you have questions, please contact your surgeon's office.  Patient given CHG with written and verbal instruction. Patient verbalized understanding.

## 2022-07-09 ENCOUNTER — Encounter (HOSPITAL_BASED_OUTPATIENT_CLINIC_OR_DEPARTMENT_OTHER)
Admission: RE | Admit: 2022-07-09 | Discharge: 2022-07-09 | Disposition: A | Discharge status: Home / Self Care | Payer: 59 | Source: Ambulatory Visit | Attending: Plastic Surgery | Admitting: Plastic Surgery

## 2022-07-09 DIAGNOSIS — Z01812 Encounter for preprocedural laboratory examination: Secondary | ICD-10-CM | POA: Insufficient documentation

## 2022-07-09 DIAGNOSIS — N62 Hypertrophy of breast: Secondary | ICD-10-CM | POA: Insufficient documentation

## 2022-07-09 LAB — CBC WITH DIFFERENTIAL/PLATELET
Abs Immature Granulocytes: 0.02 10*3/uL (ref 0.00–0.07)
Basophils Absolute: 0 10*3/uL (ref 0.0–0.1)
Basophils Relative: 0 %
Eosinophils Absolute: 0.1 10*3/uL (ref 0.0–0.5)
Eosinophils Relative: 2 %
HCT: 33.1 % — ABNORMAL LOW (ref 36.0–46.0)
Hemoglobin: 10.7 g/dL — ABNORMAL LOW (ref 12.0–15.0)
Immature Granulocytes: 0 %
Lymphocytes Relative: 38 %
Lymphs Abs: 2.3 10*3/uL (ref 0.7–4.0)
MCH: 26.7 pg (ref 26.0–34.0)
MCHC: 32.3 g/dL (ref 30.0–36.0)
MCV: 82.5 fL (ref 80.0–100.0)
Monocytes Absolute: 0.4 10*3/uL (ref 0.1–1.0)
Monocytes Relative: 7 %
Neutro Abs: 3.3 10*3/uL (ref 1.7–7.7)
Neutrophils Relative %: 53 %
Platelets: 394 10*3/uL (ref 150–400)
RBC: 4.01 MIL/uL (ref 3.87–5.11)
RDW: 16.9 % — ABNORMAL HIGH (ref 11.5–15.5)
WBC: 6.2 10*3/uL (ref 4.0–10.5)
nRBC: 0 % (ref 0.0–0.2)

## 2022-07-09 NOTE — Progress Notes (Signed)

## 2022-07-11 ENCOUNTER — Ambulatory Visit (HOSPITAL_BASED_OUTPATIENT_CLINIC_OR_DEPARTMENT_OTHER)
Admission: RE | Admit: 2022-07-11 | Discharge: 2022-07-11 | Disposition: A | Discharge status: Home / Self Care | Payer: 59 | Source: Ambulatory Visit | Attending: Plastic Surgery | Admitting: Plastic Surgery

## 2022-07-11 ENCOUNTER — Encounter (HOSPITAL_BASED_OUTPATIENT_CLINIC_OR_DEPARTMENT_OTHER)
Admission: RE | Disposition: A | Discharge status: Home / Self Care | Payer: Self-pay | Source: Ambulatory Visit | Attending: Plastic Surgery

## 2022-07-11 ENCOUNTER — Other Ambulatory Visit: Payer: Self-pay

## 2022-07-11 ENCOUNTER — Encounter (HOSPITAL_BASED_OUTPATIENT_CLINIC_OR_DEPARTMENT_OTHER): Payer: Self-pay | Admitting: Plastic Surgery

## 2022-07-11 ENCOUNTER — Ambulatory Visit (HOSPITAL_BASED_OUTPATIENT_CLINIC_OR_DEPARTMENT_OTHER): Payer: 59 | Admitting: Anesthesiology

## 2022-07-11 DIAGNOSIS — M542 Cervicalgia: Secondary | ICD-10-CM | POA: Diagnosis not present

## 2022-07-11 DIAGNOSIS — M25519 Pain in unspecified shoulder: Secondary | ICD-10-CM | POA: Insufficient documentation

## 2022-07-11 DIAGNOSIS — N62 Hypertrophy of breast: Secondary | ICD-10-CM

## 2022-07-11 DIAGNOSIS — M549 Dorsalgia, unspecified: Secondary | ICD-10-CM | POA: Insufficient documentation

## 2022-07-11 DIAGNOSIS — M25511 Pain in right shoulder: Secondary | ICD-10-CM | POA: Diagnosis not present

## 2022-07-11 DIAGNOSIS — M25512 Pain in left shoulder: Secondary | ICD-10-CM | POA: Diagnosis not present

## 2022-07-11 DIAGNOSIS — G8929 Other chronic pain: Secondary | ICD-10-CM | POA: Insufficient documentation

## 2022-07-11 DIAGNOSIS — Z87891 Personal history of nicotine dependence: Secondary | ICD-10-CM | POA: Diagnosis not present

## 2022-07-11 HISTORY — PX: BREAST REDUCTION SURGERY: SHX8

## 2022-07-11 LAB — POCT PREGNANCY, URINE: Preg Test, Ur: NEGATIVE

## 2022-07-11 SURGERY — MAMMOPLASTY, REDUCTION
Anesthesia: General | Site: Breast | Laterality: Bilateral

## 2022-07-11 MED ORDER — ONDANSETRON HCL 4 MG/2ML IJ SOLN
INTRAMUSCULAR | Status: DC | PRN
  Administered 2022-07-11: 4 mg via INTRAVENOUS

## 2022-07-11 MED ORDER — CEFAZOLIN SODIUM-DEXTROSE 2-4 GM/100ML-% IV SOLN
2.0000 g | INTRAVENOUS | Status: AC
  Administered 2022-07-11: 2 g via INTRAVENOUS

## 2022-07-11 MED ORDER — ROCURONIUM BROMIDE 100 MG/10ML IV SOLN
INTRAVENOUS | Status: DC | PRN
  Administered 2022-07-11: 50 mg via INTRAVENOUS

## 2022-07-11 MED ORDER — ALBUMIN HUMAN 5 % IV SOLN
INTRAVENOUS | Status: AC
  Filled 2022-07-11: qty 250

## 2022-07-11 MED ORDER — FENTANYL CITRATE (PF) 100 MCG/2ML IJ SOLN
INTRAMUSCULAR | Status: DC | PRN
  Administered 2022-07-11: 50 ug via INTRAVENOUS
  Administered 2022-07-11 (×2): 25 ug via INTRAVENOUS

## 2022-07-11 MED ORDER — HYDROMORPHONE HCL 1 MG/ML IJ SOLN
INTRAMUSCULAR | Status: AC
  Filled 2022-07-11: qty 1

## 2022-07-11 MED ORDER — FENTANYL CITRATE (PF) 100 MCG/2ML IJ SOLN
INTRAMUSCULAR | Status: AC
  Filled 2022-07-11: qty 2

## 2022-07-11 MED ORDER — BUPIVACAINE HCL (PF) 0.5 % IJ SOLN
INTRAMUSCULAR | Status: DC | PRN
  Administered 2022-07-11 (×2): 15 mL

## 2022-07-11 MED ORDER — SUGAMMADEX SODIUM 200 MG/2ML IV SOLN
INTRAVENOUS | Status: DC | PRN
  Administered 2022-07-11: 200 mg via INTRAVENOUS

## 2022-07-11 MED ORDER — BUPIVACAINE HCL (PF) 0.5 % IJ SOLN
INTRAMUSCULAR | Status: AC
  Filled 2022-07-11: qty 30

## 2022-07-11 MED ORDER — MIDAZOLAM HCL 2 MG/2ML IJ SOLN
INTRAMUSCULAR | Status: AC
  Filled 2022-07-11: qty 2

## 2022-07-11 MED ORDER — CHLORHEXIDINE GLUCONATE CLOTH 2 % EX PADS: 6.0000 | MEDICATED_PAD | Freq: Once | CUTANEOUS | Status: DC

## 2022-07-11 MED ORDER — PHENYLEPHRINE HCL-NACL 20-0.9 MG/250ML-% IV SOLN
INTRAVENOUS | Status: DC | PRN
  Administered 2022-07-11: 40 ug/min via INTRAVENOUS

## 2022-07-11 MED ORDER — HYDROMORPHONE HCL 1 MG/ML IJ SOLN
INTRAMUSCULAR | Status: DC | PRN
  Administered 2022-07-11: 1 mg via INTRAVENOUS

## 2022-07-11 MED ORDER — 0.9 % SODIUM CHLORIDE (POUR BTL) OPTIME
TOPICAL | Status: DC | PRN
  Administered 2022-07-11: 200 mL

## 2022-07-11 MED ORDER — CELECOXIB 200 MG PO CAPS
ORAL_CAPSULE | ORAL | Status: AC
  Filled 2022-07-11: qty 1

## 2022-07-11 MED ORDER — SUCCINYLCHOLINE CHLORIDE 200 MG/10ML IV SOSY
PREFILLED_SYRINGE | INTRAVENOUS | Status: AC
  Filled 2022-07-11: qty 10

## 2022-07-11 MED ORDER — PHENYLEPHRINE HCL (PRESSORS) 10 MG/ML IV SOLN
INTRAVENOUS | Status: DC | PRN
  Administered 2022-07-11 (×3): 80 ug via INTRAVENOUS

## 2022-07-11 MED ORDER — ACETAMINOPHEN 500 MG PO TABS
ORAL_TABLET | ORAL | Status: AC
  Filled 2022-07-11: qty 2

## 2022-07-11 MED ORDER — NITROGLYCERIN 2 % TD OINT
TOPICAL_OINTMENT | TRANSDERMAL | Status: DC | PRN
  Administered 2022-07-11: 1.5 [in_us] via TOPICAL

## 2022-07-11 MED ORDER — GABAPENTIN 300 MG PO CAPS
300.0000 mg | ORAL_CAPSULE | ORAL | Status: AC
  Administered 2022-07-11: 300 mg via ORAL

## 2022-07-11 MED ORDER — DEXAMETHASONE SODIUM PHOSPHATE 4 MG/ML IJ SOLN
INTRAMUSCULAR | Status: DC | PRN
  Administered 2022-07-11: 5 mg via INTRAVENOUS

## 2022-07-11 MED ORDER — OXYCODONE HCL 5 MG PO TABS: 5.0000 mg | ORAL_TABLET | ORAL | 0 refills | Status: DC | PRN

## 2022-07-11 MED ORDER — ONDANSETRON HCL 4 MG/2ML IJ SOLN
INTRAMUSCULAR | Status: AC
  Filled 2022-07-11: qty 2

## 2022-07-11 MED ORDER — MIDAZOLAM HCL 5 MG/5ML IJ SOLN
INTRAMUSCULAR | Status: DC | PRN
  Administered 2022-07-11: 2 mg via INTRAVENOUS

## 2022-07-11 MED ORDER — GABAPENTIN 300 MG PO CAPS
ORAL_CAPSULE | ORAL | Status: AC
  Filled 2022-07-11: qty 1

## 2022-07-11 MED ORDER — PHENYLEPHRINE HCL (PRESSORS) 10 MG/ML IV SOLN
INTRAVENOUS | Status: AC
  Filled 2022-07-11: qty 1

## 2022-07-11 MED ORDER — PROPOFOL 10 MG/ML IV BOLUS
INTRAVENOUS | Status: DC | PRN
  Administered 2022-07-11: 150 mg via INTRAVENOUS

## 2022-07-11 MED ORDER — LACTATED RINGERS IV SOLN: INTRAVENOUS | Status: DC

## 2022-07-11 MED ORDER — CELECOXIB 200 MG PO CAPS
200.0000 mg | ORAL_CAPSULE | ORAL | Status: AC
  Administered 2022-07-11: 200 mg via ORAL

## 2022-07-11 MED ORDER — EPHEDRINE SULFATE (PRESSORS) 50 MG/ML IJ SOLN
INTRAMUSCULAR | Status: DC | PRN
  Administered 2022-07-11: 10 mg via INTRAVENOUS

## 2022-07-11 MED ORDER — ROCURONIUM BROMIDE 10 MG/ML (PF) SYRINGE
PREFILLED_SYRINGE | INTRAVENOUS | Status: AC
  Filled 2022-07-11: qty 10

## 2022-07-11 MED ORDER — FENTANYL CITRATE (PF) 100 MCG/2ML IJ SOLN
25.0000 ug | INTRAMUSCULAR | Status: DC | PRN
  Administered 2022-07-11: 25 ug via INTRAVENOUS
  Administered 2022-07-11: 50 ug via INTRAVENOUS

## 2022-07-11 MED ORDER — OXYCODONE HCL 5 MG PO TABS
5.0000 mg | ORAL_TABLET | Freq: Once | ORAL | Status: AC
  Administered 2022-07-11: 5 mg via ORAL

## 2022-07-11 MED ORDER — EPHEDRINE 5 MG/ML INJ
INTRAVENOUS | Status: AC
  Filled 2022-07-11: qty 5

## 2022-07-11 MED ORDER — SUGAMMADEX SODIUM 500 MG/5ML IV SOLN
INTRAVENOUS | Status: AC
  Filled 2022-07-11: qty 5

## 2022-07-11 MED ORDER — PHENYLEPHRINE 80 MCG/ML (10ML) SYRINGE FOR IV PUSH (FOR BLOOD PRESSURE SUPPORT)
PREFILLED_SYRINGE | INTRAVENOUS | Status: AC
  Filled 2022-07-11: qty 10

## 2022-07-11 MED ORDER — ATROPINE SULFATE 0.4 MG/ML IV SOLN
INTRAVENOUS | Status: AC
  Filled 2022-07-11: qty 1

## 2022-07-11 MED ORDER — OXYCODONE HCL 5 MG PO TABS
ORAL_TABLET | ORAL | Status: AC
  Filled 2022-07-11: qty 1

## 2022-07-11 MED ORDER — ALBUMIN HUMAN 5 % IV SOLN: INTRAVENOUS | Status: DC | PRN

## 2022-07-11 MED ORDER — LIDOCAINE 2% (20 MG/ML) 5 ML SYRINGE
INTRAMUSCULAR | Status: AC
  Filled 2022-07-11: qty 5

## 2022-07-11 MED ORDER — CEFAZOLIN SODIUM-DEXTROSE 2-4 GM/100ML-% IV SOLN
INTRAVENOUS | Status: AC
  Filled 2022-07-11: qty 100

## 2022-07-11 MED ORDER — ACETAMINOPHEN 500 MG PO TABS
1000.0000 mg | ORAL_TABLET | ORAL | Status: AC
  Administered 2022-07-11: 1000 mg via ORAL

## 2022-07-11 MED ORDER — NITROGLYCERIN 2 % TD OINT
TOPICAL_OINTMENT | TRANSDERMAL | Status: AC
  Filled 2022-07-11: qty 30

## 2022-07-11 MED ORDER — DEXAMETHASONE SODIUM PHOSPHATE 10 MG/ML IJ SOLN
INTRAMUSCULAR | Status: AC
  Filled 2022-07-11: qty 1

## 2022-07-11 SURGICAL SUPPLY — 61 items
BINDER BREAST XLRG (GAUZE/BANDAGES/DRESSINGS) IMPLANT
BINDER BREAST XXLRG (GAUZE/BANDAGES/DRESSINGS) IMPLANT
BLADE SURG 10 STRL SS (BLADE) ×4 IMPLANT
BLADE SURG 15 STRL LF DISP TIS (BLADE) ×1 IMPLANT
BLADE SURG 15 STRL SS (BLADE) ×1
CANISTER SUCT 1200ML W/VALVE (MISCELLANEOUS) ×1 IMPLANT
CHLORAPREP W/TINT 26 (MISCELLANEOUS) ×2 IMPLANT
COTTONBALL LRG STERILE PKG (GAUZE/BANDAGES/DRESSINGS) IMPLANT
COVER BACK TABLE 60X90IN (DRAPES) ×1 IMPLANT
COVER MAYO STAND STRL (DRAPES) ×1 IMPLANT
DERMABOND ADVANCED .7 DNX12 (GAUZE/BANDAGES/DRESSINGS) ×2 IMPLANT
DRAIN CHANNEL 15F RND FF W/TCR (WOUND CARE) IMPLANT
DRAPE TOP ARMCOVERS (MISCELLANEOUS) ×1 IMPLANT
DRAPE U-SHAPE 76X120 STRL (DRAPES) ×1 IMPLANT
DRAPE UTILITY XL STRL (DRAPES) ×1 IMPLANT
DRSG EMULSION OIL 3X3 NADH (GAUZE/BANDAGES/DRESSINGS) ×1 IMPLANT
DRSG TEGADERM 4X10 (GAUZE/BANDAGES/DRESSINGS) IMPLANT
DRSG TEGADERM 4X4.75 (GAUZE/BANDAGES/DRESSINGS) IMPLANT
ELECT COATED BLADE 2.86 ST (ELECTRODE) ×1 IMPLANT
ELECT REM PT RETURN 9FT ADLT (ELECTROSURGICAL) ×1
ELECTRODE REM PT RTRN 9FT ADLT (ELECTROSURGICAL) ×1 IMPLANT
EVACUATOR SILICONE 100CC (DRAIN) IMPLANT
GAUZE PAD ABD 8X10 STRL (GAUZE/BANDAGES/DRESSINGS) ×2 IMPLANT
GAUZE XEROFORM 1X8 LF (GAUZE/BANDAGES/DRESSINGS) IMPLANT
GAUZE XEROFORM 5X9 LF (GAUZE/BANDAGES/DRESSINGS) IMPLANT
GLOVE BIO SURGEON STRL SZ 6 (GLOVE) ×2 IMPLANT
GOWN STRL REUS W/ TWL LRG LVL3 (GOWN DISPOSABLE) ×2 IMPLANT
GOWN STRL REUS W/TWL LRG LVL3 (GOWN DISPOSABLE) ×2
MARKER SKIN DUAL TIP RULER LAB (MISCELLANEOUS) IMPLANT
NDL HYPO 25X1 1.5 SAFETY (NEEDLE) ×1 IMPLANT
NEEDLE HYPO 25X1 1.5 SAFETY (NEEDLE) ×1 IMPLANT
NS IRRIG 1000ML POUR BTL (IV SOLUTION) ×1 IMPLANT
PACK BASIN DAY SURGERY FS (CUSTOM PROCEDURE TRAY) ×1 IMPLANT
PENCIL SMOKE EVACUATOR (MISCELLANEOUS) ×1 IMPLANT
PIN SAFETY STERILE (MISCELLANEOUS) ×1 IMPLANT
SHEET MEDIUM DRAPE 40X70 STRL (DRAPES) ×2 IMPLANT
SLEEVE SCD COMPRESS KNEE MED (STOCKING) ×1 IMPLANT
SPONGE GAUZE 2X2 8PLY STRL LF (GAUZE/BANDAGES/DRESSINGS) IMPLANT
SPONGE T-LAP 18X18 ~~LOC~~+RFID (SPONGE) ×3 IMPLANT
STAPLER VISISTAT 35W (STAPLE) ×1 IMPLANT
STRIP CLOSURE SKIN 1/2X4 (GAUZE/BANDAGES/DRESSINGS) IMPLANT
STRIP CLOSURE SKIN 1/4X4 (GAUZE/BANDAGES/DRESSINGS) IMPLANT
SUT CHROMIC 5 0 P 3 (SUTURE) IMPLANT
SUT ETHILON 2 0 FS 18 (SUTURE) IMPLANT
SUT MNCRL AB 4-0 PS2 18 (SUTURE) IMPLANT
SUT MON AB 5-0 P3 18 (SUTURE) IMPLANT
SUT PLAIN 5 0 P 3 18 (SUTURE) IMPLANT
SUT PROLENE 5 0 P 3 (SUTURE) IMPLANT
SUT PROLENE 5 0 PS 2 (SUTURE) IMPLANT
SUT PROLENE 6 0 P 1 18 (SUTURE) IMPLANT
SUT SILK 4 0 PS 2 (SUTURE) IMPLANT
SUT VIC AB 3-0 PS1 18 (SUTURE) ×8
SUT VIC AB 3-0 PS1 18XBRD (SUTURE) IMPLANT
SUT VICRYL 4-0 PS2 18IN ABS (SUTURE) IMPLANT
SYR BULB IRRIG 60ML STRL (SYRINGE) ×1 IMPLANT
SYR CONTROL 10ML LL (SYRINGE) ×1 IMPLANT
TOWEL GREEN STERILE FF (TOWEL DISPOSABLE) ×2 IMPLANT
TRAY FOLEY W/BAG SLVR 14FR LF (SET/KITS/TRAYS/PACK) IMPLANT
TUBE CONNECTING 20X1/4 (TUBING) ×1 IMPLANT
UNDERPAD 30X36 HEAVY ABSORB (UNDERPADS AND DIAPERS) ×2 IMPLANT
YANKAUER SUCT BULB TIP NO VENT (SUCTIONS) ×1 IMPLANT

## 2022-07-11 NOTE — Anesthesia Preprocedure Evaluation (Signed)
Anesthesia Evaluation  Patient identified by MRN, date of birth, ID band Patient awake    Reviewed: Allergy & Precautions, NPO status , Patient's Chart, lab work & pertinent test results  Airway Mallampati: II       Dental   Pulmonary former smoker   breath sounds clear to auscultation       Cardiovascular  Rhythm:Regular Rate:Normal  History noted Dr. Chilton Si   Neuro/Psych  Headaches PSYCHIATRIC DISORDERS         GI/Hepatic negative GI ROS, Neg liver ROS,,,  Endo/Other  negative endocrine ROS    Renal/GU negative Renal ROS     Musculoskeletal   Abdominal   Peds  Hematology   Anesthesia Other Findings   Reproductive/Obstetrics                             Anesthesia Physical Anesthesia Plan  ASA: 2  Anesthesia Plan: General   Post-op Pain Management:    Induction: Intravenous  PONV Risk Score and Plan: 3 and Ondansetron, Dexamethasone and Midazolam  Airway Management Planned: Oral ETT  Additional Equipment:   Intra-op Plan:   Post-operative Plan: Extubation in OR  Informed Consent: I have reviewed the patients History and Physical, chart, labs and discussed the procedure including the risks, benefits and alternatives for the proposed anesthesia with the patient or authorized representative who has indicated his/her understanding and acceptance.     Dental advisory given  Plan Discussed with: CRNA and Anesthesiologist  Anesthesia Plan Comments:        Anesthesia Quick Evaluation

## 2022-07-11 NOTE — Op Note (Signed)
Operative Note   DATE OF OPERATION: 12.22.23  LOCATION: [facility]-outpatient  SURGICAL DIVISION: Plastic Surgery  PREOPERATIVE DIAGNOSES:  1. Macromastia 2. Chronic neck and back pain 3. Shoulder pain  POSTOPERATIVE DIAGNOSES:  same  PROCEDURE:  Bilateral breast reduction  SURGEON: Glenna Fellows MD MBA  ASSISTANT: none  ANESTHESIA:  General.   EBL: 65 ml  COMPLICATIONS: None immediate.   INDICATIONS FOR PROCEDURE:  The patient, Gail Brewer, is a 46 y.o. female born on 1975-12-11, is here for treatment chronic neck back shoulder pain in setting macromastia that has failed conservative measures.   FINDINGS: Right reduction 1001 g Left reduction 1367 g  DESCRIPTION OF PROCEDURE:  The patient was marked standing in the preoperative area to mark sternal notch, chest midline, anterior axillary lines, inframammary folds. The location of new nipple areolar complex was marked at level of on inframammary fold on anterior surface breast by palpation. This was marked symmetric over bilateral breasts. With aid of Wise pattern marker, location of new nipple areolar complex and vertical limbs 8 cm) were marked by displacement of breasts along meridian. The patient was taken to the operating room. SCDs were placed and IV antibiotics were given. The patient's operative site was prepped and draped in a sterile fashion. A time out was performed and all information was confirmed to be correct.     I began on left breast. Over left breast, superomedial pedicle marked and nipple areolar complex incised with 45 mm diameter marker. Pedicle deepithlialized and developed to chest wall. Breast tissue resected over lower pole. Medial and lateral flaps developed. Additional lateral breast tissue excised. Breast tailor tacked closed.    I then directed attention to right breast where superomedial pedicle designed. NAC incised with 45 mm diameter marker. The pedicle was deepithelialized. Pedicle  developed to chest wall. Breast tissue resected over lower pole. Medial and lateral flaps developed. Additional lateral breast tissue excised. Breast tailor tacked closed. Patient brought to upright sitting position and assessed for symmetry. Patient returned to supine position. Breast cavities irrigated and hemostasis obtained. Local anesthetic infiltrated throughout each breast. 15 Fr JP placed in each breast and secured with 2-0 nylon. Nitropaste applied to left nipple areola during closure. Closure completed bilateral with 3-0 vicryl to approximate dermis along inframammary fold and vertical limb. NAC inset with 4-0 vicryl in dermis. Skin closure completed with 4-0 monocryl subcuticular along NAC and vertical closure. Along inframammary fold closure 3-0 V Lock suture used bilateral. All Nitropaste removed prior to application tissue adhesive over all incisions. Dry dressing and breast binder applied.  The patient was allowed to wake from anesthesia, extubated and taken to the recovery room in satisfactory condition.   SPECIMENS: right and left breast reduction  DRAINS: 15 Fr JP in right and left breast  Glenna Fellows, MD [facility]  Office/ physician access line after hours [phone]

## 2022-07-11 NOTE — Transfer of Care (Signed)
Immediate Anesthesia Transfer of Care Note  Patient: Gail Brewer  Procedure(s) Performed: MAMMARY REDUCTION  (BREAST) (Bilateral: Breast)  Patient Location: PACU  Anesthesia Type:General  Level of Consciousness: drowsy and patient cooperative  Airway & Oxygen Therapy: Patient Spontanous Breathing and Patient connected to face mask oxygen  Post-op Assessment: Report given to RN and Post -op Vital signs reviewed and stable  Post vital signs: Reviewed and stable  Last Vitals:  Vitals Value Taken Time  BP 111/59 07/11/22 1201  Temp    Pulse 96 07/11/22 1202  Resp 33 07/11/22 1202  SpO2 97 % 07/11/22 1202  Vitals shown include unvalidated device data.  Last Pain:  Vitals:   07/11/22 0648  TempSrc: Oral  PainSc: 0-No pain      Patients Stated Pain Goal: 4 (07/11/22 8502)  Complications: No notable events documented.

## 2022-07-11 NOTE — Discharge Instructions (Signed)
May take Tylenol after 12:45pm, if needed. May take NSAIDS (ibuprofen, motrin) after 12:45pm, if needed.    Post Anesthesia Home Care Instructions  Activity: Get plenty of rest for the remainder of the day. A responsible individual must stay with you for 24 hours following the procedure.  For the next 24 hours, DO NOT: -Drive a car -Operate machinery -Drink alcoholic beverages -Take any medication unless instructed by your physician -Make any legal decisions or sign important papers.  Meals: Start with liquid foods such as gelatin or soup. Progress to regular foods as tolerated. Avoid greasy, spicy, heavy foods. If nausea and/or vomiting occur, drink only clear liquids until the nausea and/or vomiting subsides. Call your physician if vomiting continues.  Special Instructions/Symptoms: Your throat may feel dry or sore from the anesthesia or the breathing tube placed in your throat during surgery. If this causes discomfort, gargle with warm salt water. The discomfort should disappear within 24 hours.  If you had a scopolamine patch placed behind your ear for the management of post- operative nausea and/or vomiting:  1. The medication in the patch is effective for 72 hours, after which it should be removed.  Wrap patch in a tissue and discard in the trash. Wash hands thoroughly with soap and water. 2. You may remove the patch earlier than 72 hours if you experience unpleasant side effects which may include dry mouth, dizziness or visual disturbances. 3. Avoid touching the patch. Wash your hands with soap and water after contact with the patch.     About my Jackson-Pratt Bulb Drain  What is a Jackson-Pratt bulb? A Jackson-Pratt is a soft, round device used to collect drainage. It is connected to a long, thin drainage catheter, which is held in place by one or two small stiches near your surgical incision site. When the bulb is squeezed, it forms a vacuum, forcing the drainage to empty into  the bulb.  Emptying the Jackson-Pratt bulb- To empty the bulb: 1. Release the plug on the top of the bulb. 2. Pour the bulb's contents into a measuring container which your nurse will provide. 3. Record the time emptied and amount of drainage. Empty the drain(s) as often as your     doctor or nurse recommends.  Date                  Time                    Amount (Drain 1)                 Amount (Drain 2)  _____________________________________________________________________  _____________________________________________________________________  _____________________________________________________________________  _____________________________________________________________________  _____________________________________________________________________  _____________________________________________________________________  _____________________________________________________________________  _____________________________________________________________________  Squeezing the Jackson-Pratt Bulb- To squeeze the bulb: 1. Make sure the plug at the top of the bulb is open. 2. Squeeze the bulb tightly in your fist. You will hear air squeezing from the bulb. 3. Replace the plug while the bulb is squeezed. 4. Use a safety pin to attach the bulb to your clothing. This will keep the catheter from     pulling at the bulb insertion site.  When to call your doctor- Call your doctor if: Drain site becomes red, swollen or hot. You have a fever greater than 101 degrees F. There is oozing at the drain site. Drain falls out (apply a guaze bandage over the drain hole and secure it with tape). Drainage increases daily not related to activity patterns. (You will usually have   more drainage when you are active than when you are resting.) Drainage has a bad odor.  

## 2022-07-11 NOTE — Anesthesia Postprocedure Evaluation (Signed)
Anesthesia Post Note  Patient: Gail Brewer  Procedure(s) Performed: MAMMARY REDUCTION  (BREAST) (Bilateral: Breast)     Patient location during evaluation: PACU Anesthesia Type: General Level of consciousness: awake Pain management: pain level controlled Respiratory status: spontaneous breathing Cardiovascular status: stable Postop Assessment: no apparent nausea or vomiting Anesthetic complications: no   No notable events documented.  Last Vitals:  Vitals:   07/11/22 1235 07/11/22 1240  BP:    Pulse:    Resp:    Temp:    SpO2: 94% 100%    Last Pain:  Vitals:   07/11/22 1240  TempSrc:   PainSc: 9                  Tifini Reeder

## 2022-07-11 NOTE — Interval H&P Note (Signed)
History and Physical Interval Note:  07/11/2022 6:53 AM  Gail Brewer  has presented today for surgery, with the diagnosis of macromastia, chronic neck and back pain, shoulder pain.  The various methods of treatment have been discussed with the patient and family. After consideration of risks, benefits and other options for treatment, the patient has consented to  bilateral breast reduction possible free nipple grafts as a surgical intervention.  The patient's history has been reviewed, patient examined, no change in status, stable for surgery.  I have reviewed the patient's chart and labs.  Questions were answered to the patient's satisfaction.     Irean Hong Javarious Elsayed

## 2022-07-11 NOTE — Anesthesia Procedure Notes (Signed)
Procedure Name: Intubation Date/Time: 07/11/2022 7:30 AM  Performed by: Willa Frater, CRNAPre-anesthesia Checklist: Patient identified, Emergency Drugs available, Suction available and Patient being monitored Patient Re-evaluated:Patient Re-evaluated prior to induction Oxygen Delivery Method: Circle system utilized Preoxygenation: Pre-oxygenation with 100% oxygen Induction Type: IV induction Ventilation: Mask ventilation without difficulty Laryngoscope Size: Mac and 3 Grade View: Grade I Tube type: Oral Tube size: 7.0 mm Number of attempts: 1 Airway Equipment and Method: Stylet and Oral airway Placement Confirmation: ETT inserted through vocal cords under direct vision, positive ETCO2 and breath sounds checked- equal and bilateral Secured at: 22 cm Tube secured with: Tape Dental Injury: Teeth and Oropharynx as per pre-operative assessment

## 2022-07-15 LAB — SURGICAL PATHOLOGY

## 2022-07-22 ENCOUNTER — Encounter (HOSPITAL_BASED_OUTPATIENT_CLINIC_OR_DEPARTMENT_OTHER): Payer: Self-pay

## 2022-07-22 ENCOUNTER — Emergency Department (HOSPITAL_BASED_OUTPATIENT_CLINIC_OR_DEPARTMENT_OTHER)
Admission: EM | Admit: 2022-07-22 | Discharge: 2022-07-22 | Disposition: A | Discharge status: Home / Self Care | Payer: Medicaid Other | Source: Home / Self Care | Attending: Emergency Medicine | Admitting: Emergency Medicine

## 2022-07-22 ENCOUNTER — Ambulatory Visit (HOSPITAL_BASED_OUTPATIENT_CLINIC_OR_DEPARTMENT_OTHER): Payer: Medicaid Other | Admitting: Certified Registered"

## 2022-07-22 ENCOUNTER — Encounter (HOSPITAL_BASED_OUTPATIENT_CLINIC_OR_DEPARTMENT_OTHER): Payer: Self-pay | Admitting: Plastic Surgery

## 2022-07-22 ENCOUNTER — Other Ambulatory Visit: Payer: Self-pay

## 2022-07-22 ENCOUNTER — Ambulatory Visit (HOSPITAL_BASED_OUTPATIENT_CLINIC_OR_DEPARTMENT_OTHER): Admit: 2022-07-22 | Payer: No Typology Code available for payment source | Admitting: Plastic Surgery

## 2022-07-22 ENCOUNTER — Ambulatory Visit (HOSPITAL_BASED_OUTPATIENT_CLINIC_OR_DEPARTMENT_OTHER)
Admission: AD | Admit: 2022-07-22 | Discharge: 2022-07-23 | Disposition: A | Discharge status: Home / Self Care | Payer: Medicaid Other | Source: Ambulatory Visit | Attending: Plastic Surgery | Admitting: Plastic Surgery

## 2022-07-22 ENCOUNTER — Encounter (HOSPITAL_BASED_OUTPATIENT_CLINIC_OR_DEPARTMENT_OTHER)
Admission: AD | Disposition: A | Discharge status: Home / Self Care | Payer: Self-pay | Source: Ambulatory Visit | Attending: Plastic Surgery

## 2022-07-22 DIAGNOSIS — F32A Depression, unspecified: Secondary | ICD-10-CM | POA: Insufficient documentation

## 2022-07-22 DIAGNOSIS — S21002A Unspecified open wound of left breast, initial encounter: Secondary | ICD-10-CM | POA: Diagnosis not present

## 2022-07-22 DIAGNOSIS — Y69 Unspecified misadventure during surgical and medical care: Secondary | ICD-10-CM | POA: Insufficient documentation

## 2022-07-22 DIAGNOSIS — T8130XA Disruption of wound, unspecified, initial encounter: Secondary | ICD-10-CM | POA: Insufficient documentation

## 2022-07-22 DIAGNOSIS — Y838 Other surgical procedures as the cause of abnormal reaction of the patient, or of later complication, without mention of misadventure at the time of the procedure: Secondary | ICD-10-CM | POA: Insufficient documentation

## 2022-07-22 DIAGNOSIS — N641 Fat necrosis of breast: Secondary | ICD-10-CM | POA: Diagnosis not present

## 2022-07-22 DIAGNOSIS — F419 Anxiety disorder, unspecified: Secondary | ICD-10-CM | POA: Diagnosis not present

## 2022-07-22 DIAGNOSIS — T86821 Skin graft (allograft) (autograft) failure: Secondary | ICD-10-CM | POA: Diagnosis present

## 2022-07-22 DIAGNOSIS — T8131XA Disruption of external operation (surgical) wound, not elsewhere classified, initial encounter: Secondary | ICD-10-CM | POA: Insufficient documentation

## 2022-07-22 DIAGNOSIS — Z6838 Body mass index (BMI) 38.0-38.9, adult: Secondary | ICD-10-CM | POA: Diagnosis not present

## 2022-07-22 DIAGNOSIS — Z87891 Personal history of nicotine dependence: Secondary | ICD-10-CM | POA: Diagnosis not present

## 2022-07-22 DIAGNOSIS — I96 Gangrene, not elsewhere classified: Secondary | ICD-10-CM | POA: Diagnosis present

## 2022-07-22 HISTORY — PX: BREAST IMPLANT REMOVAL: SHX5361

## 2022-07-22 LAB — CBC WITH DIFFERENTIAL/PLATELET
Abs Immature Granulocytes: 0.11 10*3/uL — ABNORMAL HIGH (ref 0.00–0.07)
Basophils Absolute: 0 10*3/uL (ref 0.0–0.1)
Basophils Relative: 0 %
Eosinophils Absolute: 0.3 10*3/uL (ref 0.0–0.5)
Eosinophils Relative: 3 %
HCT: 27.2 % — ABNORMAL LOW (ref 36.0–46.0)
Hemoglobin: 8.3 g/dL — ABNORMAL LOW (ref 12.0–15.0)
Immature Granulocytes: 1 %
Lymphocytes Relative: 26 %
Lymphs Abs: 2.3 10*3/uL (ref 0.7–4.0)
MCH: 25.3 pg — ABNORMAL LOW (ref 26.0–34.0)
MCHC: 30.5 g/dL (ref 30.0–36.0)
MCV: 82.9 fL (ref 80.0–100.0)
Monocytes Absolute: 1 10*3/uL (ref 0.1–1.0)
Monocytes Relative: 11 %
Neutro Abs: 5 10*3/uL (ref 1.7–7.7)
Neutrophils Relative %: 59 %
Platelets: 434 10*3/uL — ABNORMAL HIGH (ref 150–400)
RBC: 3.28 MIL/uL — ABNORMAL LOW (ref 3.87–5.11)
RDW: 16.5 % — ABNORMAL HIGH (ref 11.5–15.5)
WBC: 8.7 10*3/uL (ref 4.0–10.5)
nRBC: 0 % (ref 0.0–0.2)

## 2022-07-22 LAB — BASIC METABOLIC PANEL
Anion gap: 10 (ref 5–15)
BUN: 11 mg/dL (ref 6–20)
CO2: 25 mmol/L (ref 22–32)
Calcium: 9.2 mg/dL (ref 8.9–10.3)
Chloride: 101 mmol/L (ref 98–111)
Creatinine, Ser: 0.6 mg/dL (ref 0.44–1.00)
GFR, Estimated: >60 mL/min (ref >60)
Glucose, Bld: 123 mg/dL — ABNORMAL HIGH (ref 70–99)
Potassium: 3.8 mmol/L (ref 3.5–5.1)
Sodium: 136 mmol/L (ref 135–145)

## 2022-07-22 LAB — POCT PREGNANCY, URINE: Preg Test, Ur: NEGATIVE

## 2022-07-22 SURGERY — REMOVAL, IMPLANT, BREAST
Anesthesia: General | Site: Breast | Laterality: Left

## 2022-07-22 MED ORDER — MIDAZOLAM HCL 2 MG/2ML IJ SOLN: 0.5000 mg | Freq: Once | INTRAMUSCULAR | Status: DC | PRN

## 2022-07-22 MED ORDER — FENTANYL CITRATE (PF) 100 MCG/2ML IJ SOLN
INTRAMUSCULAR | Status: DC | PRN
  Administered 2022-07-22: 25 ug via INTRAVENOUS
  Administered 2022-07-22: 50 ug via INTRAVENOUS
  Administered 2022-07-22: 100 ug via INTRAVENOUS
  Administered 2022-07-22: 25 ug via INTRAVENOUS

## 2022-07-22 MED ORDER — HYDROMORPHONE HCL 1 MG/ML IJ SOLN: 0.5000 mg | INTRAMUSCULAR | Status: DC | PRN

## 2022-07-22 MED ORDER — ACETAMINOPHEN 500 MG PO TABS
ORAL_TABLET | ORAL | Status: AC
  Filled 2022-07-22: qty 2

## 2022-07-22 MED ORDER — MORPHINE SULFATE (PF) 4 MG/ML IV SOLN
4.0000 mg | Freq: Once | INTRAVENOUS | Status: AC
  Administered 2022-07-22: 4 mg via INTRAVENOUS
  Filled 2022-07-22: qty 1

## 2022-07-22 MED ORDER — LIDOCAINE HCL (CARDIAC) PF 100 MG/5ML IV SOSY
PREFILLED_SYRINGE | INTRAVENOUS | Status: DC | PRN
  Administered 2022-07-22: 100 mg via INTRAVENOUS

## 2022-07-22 MED ORDER — HYDROMORPHONE HCL 1 MG/ML IJ SOLN
INTRAMUSCULAR | Status: AC
  Filled 2022-07-22: qty 0.5

## 2022-07-22 MED ORDER — 0.9 % SODIUM CHLORIDE (POUR BTL) OPTIME
TOPICAL | Status: DC | PRN
  Administered 2022-07-22: 1000 mL

## 2022-07-22 MED ORDER — LACTATED RINGERS IV SOLN: INTRAVENOUS | Status: DC | PRN

## 2022-07-22 MED ORDER — PROMETHAZINE HCL 25 MG/ML IJ SOLN: 6.2500 mg | INTRAMUSCULAR | Status: DC | PRN

## 2022-07-22 MED ORDER — CEFAZOLIN SODIUM-DEXTROSE 2-4 GM/100ML-% IV SOLN: 2.0000 g | INTRAVENOUS | Status: DC

## 2022-07-22 MED ORDER — OXYCODONE HCL 5 MG/5ML PO SOLN: 5.0000 mg | Freq: Once | ORAL | Status: DC | PRN

## 2022-07-22 MED ORDER — ONDANSETRON 4 MG PO TBDP: 4.0000 mg | ORAL_TABLET | Freq: Four times a day (QID) | ORAL | Status: DC | PRN

## 2022-07-22 MED ORDER — OXYCODONE-ACETAMINOPHEN 5-325 MG PO TABS
1.0000 | ORAL_TABLET | ORAL | Status: DC | PRN
  Administered 2022-07-22: 2 via ORAL
  Administered 2022-07-23: 1 via ORAL
  Filled 2022-07-22: qty 2
  Filled 2022-07-22: qty 1

## 2022-07-22 MED ORDER — LACTATED RINGERS IV SOLN: INTRAVENOUS | Status: DC

## 2022-07-22 MED ORDER — ONDANSETRON HCL 4 MG/2ML IJ SOLN: 4.0000 mg | Freq: Four times a day (QID) | INTRAMUSCULAR | Status: DC | PRN

## 2022-07-22 MED ORDER — ONDANSETRON HCL 4 MG/2ML IJ SOLN
4.0000 mg | Freq: Once | INTRAMUSCULAR | Status: AC
  Administered 2022-07-22: 4 mg via INTRAVENOUS
  Filled 2022-07-22: qty 2

## 2022-07-22 MED ORDER — MEPERIDINE HCL 25 MG/ML IJ SOLN: 6.2500 mg | INTRAMUSCULAR | Status: DC | PRN

## 2022-07-22 MED ORDER — MIDAZOLAM HCL 2 MG/2ML IJ SOLN
INTRAMUSCULAR | Status: DC | PRN
  Administered 2022-07-22: 2 mg via INTRAVENOUS

## 2022-07-22 MED ORDER — BUPIVACAINE HCL (PF) 0.25 % IJ SOLN
INTRAMUSCULAR | Status: DC | PRN
  Administered 2022-07-22: 20 mL

## 2022-07-22 MED ORDER — METHOCARBAMOL 500 MG PO TABS: 500.0000 mg | ORAL_TABLET | Freq: Four times a day (QID) | ORAL | Status: DC | PRN

## 2022-07-22 MED ORDER — CEFAZOLIN SODIUM-DEXTROSE 2-3 GM-%(50ML) IV SOLR
INTRAVENOUS | Status: DC | PRN
  Administered 2022-07-22: 2 g via INTRAVENOUS

## 2022-07-22 MED ORDER — KCL IN DEXTROSE-NACL 20-5-0.45 MEQ/L-%-% IV SOLN
INTRAVENOUS | Status: DC
  Filled 2022-07-22: qty 1000

## 2022-07-22 MED ORDER — PROPOFOL 10 MG/ML IV BOLUS
INTRAVENOUS | Status: DC | PRN
  Administered 2022-07-22: 200 mg via INTRAVENOUS

## 2022-07-22 MED ORDER — SCOPOLAMINE 1 MG/3DAYS TD PT72
1.0000 | MEDICATED_PATCH | TRANSDERMAL | Status: DC
  Administered 2022-07-22: 1.5 mg via TRANSDERMAL

## 2022-07-22 MED ORDER — PHENYLEPHRINE HCL (PRESSORS) 10 MG/ML IV SOLN
INTRAVENOUS | Status: DC | PRN
  Administered 2022-07-22: 240 ug via INTRAVENOUS

## 2022-07-22 MED ORDER — HYDROMORPHONE HCL 1 MG/ML IJ SOLN
0.2500 mg | INTRAMUSCULAR | Status: DC | PRN
  Administered 2022-07-22 (×2): 0.5 mg via INTRAVENOUS

## 2022-07-22 MED ORDER — ACETAMINOPHEN 500 MG PO TABS
1000.0000 mg | ORAL_TABLET | Freq: Once | ORAL | Status: AC
  Administered 2022-07-22: 1000 mg via ORAL

## 2022-07-22 MED ORDER — DEXAMETHASONE SODIUM PHOSPHATE 10 MG/ML IJ SOLN
INTRAMUSCULAR | Status: DC | PRN
  Administered 2022-07-22: 5 mg via INTRAVENOUS

## 2022-07-22 MED ORDER — OXYCODONE HCL 5 MG PO TABS: 5.0000 mg | ORAL_TABLET | Freq: Once | ORAL | Status: DC | PRN

## 2022-07-22 MED ORDER — ONDANSETRON HCL 4 MG/2ML IJ SOLN
INTRAMUSCULAR | Status: DC | PRN
  Administered 2022-07-22: 4 mg via INTRAVENOUS

## 2022-07-22 MED ORDER — OXYCODONE-ACETAMINOPHEN 5-325 MG PO TABS: 1.0000 | ORAL_TABLET | Freq: Four times a day (QID) | ORAL | 0 refills | Status: DC | PRN

## 2022-07-22 MED ORDER — SODIUM CHLORIDE 0.9 % IV SOLN
INTRAVENOUS | Status: AC
  Filled 2022-07-22: qty 10

## 2022-07-22 MED ORDER — SCOPOLAMINE 1 MG/3DAYS TD PT72
MEDICATED_PATCH | TRANSDERMAL | Status: AC
  Filled 2022-07-22: qty 1

## 2022-07-22 MED ORDER — KETOROLAC TROMETHAMINE 30 MG/ML IJ SOLN
30.0000 mg | Freq: Three times a day (TID) | INTRAMUSCULAR | Status: AC
  Administered 2022-07-22 – 2022-07-23 (×2): 30 mg via INTRAVENOUS
  Filled 2022-07-22 (×2): qty 1

## 2022-07-22 SURGICAL SUPPLY — 66 items
BAG DECANTER FOR FLEXI CONT (MISCELLANEOUS) ×1 IMPLANT
BINDER BREAST LRG (GAUZE/BANDAGES/DRESSINGS) IMPLANT
BINDER BREAST MEDIUM (GAUZE/BANDAGES/DRESSINGS) IMPLANT
BINDER BREAST XLRG (GAUZE/BANDAGES/DRESSINGS) IMPLANT
BINDER BREAST XXLRG (GAUZE/BANDAGES/DRESSINGS) IMPLANT
BLADE SURG 10 STRL SS (BLADE) ×1 IMPLANT
BLADE SURG 15 STRL LF DISP TIS (BLADE) IMPLANT
BLADE SURG 15 STRL SS (BLADE) ×1
BNDG GAUZE DERMACEA FLUFF 4 (GAUZE/BANDAGES/DRESSINGS) IMPLANT
CANISTER SUCT 1200ML W/VALVE (MISCELLANEOUS) ×1 IMPLANT
CHLORAPREP W/TINT 26 (MISCELLANEOUS) ×1 IMPLANT
COVER BACK TABLE 60X90IN (DRAPES) ×1 IMPLANT
COVER MAYO STAND STRL (DRAPES) ×1 IMPLANT
DERMABOND ADVANCED .7 DNX12 (GAUZE/BANDAGES/DRESSINGS) ×1 IMPLANT
DRAIN CHANNEL 15F RND FF W/TCR (WOUND CARE) IMPLANT
DRAIN CHANNEL 19F RND (DRAIN) IMPLANT
DRAPE INCISE IOBAN 66X45 STRL (DRAPES) IMPLANT
DRAPE LAPAROTOMY 100X72 PEDS (DRAPES) IMPLANT
DRAPE TOP ARMCOVERS (MISCELLANEOUS) ×1 IMPLANT
DRAPE U-SHAPE 76X120 STRL (DRAPES) ×1 IMPLANT
DRAPE UTILITY XL STRL (DRAPES) ×1 IMPLANT
ELECT BLADE 4.0 EZ CLEAN MEGAD (MISCELLANEOUS)
ELECT BLADE 6.5 EXT (BLADE) IMPLANT
ELECT COATED BLADE 2.86 ST (ELECTRODE) ×1 IMPLANT
ELECT REM PT RETURN 9FT ADLT (ELECTROSURGICAL) ×1
ELECTRODE BLDE 4.0 EZ CLN MEGD (MISCELLANEOUS) IMPLANT
ELECTRODE REM PT RTRN 9FT ADLT (ELECTROSURGICAL) ×1 IMPLANT
EVACUATOR SILICONE 100CC (DRAIN) IMPLANT
GAUZE PAD ABD 8X10 STRL (GAUZE/BANDAGES/DRESSINGS) ×1 IMPLANT
GAUZE SPONGE 4X4 12PLY STRL (GAUZE/BANDAGES/DRESSINGS) IMPLANT
GAUZE SPONGE 4X4 12PLY STRL LF (GAUZE/BANDAGES/DRESSINGS) IMPLANT
GLOVE BIO SURGEON STRL SZ 6 (GLOVE) ×1 IMPLANT
GOWN STRL REUS W/ TWL LRG LVL3 (GOWN DISPOSABLE) ×2 IMPLANT
GOWN STRL REUS W/TWL LRG LVL3 (GOWN DISPOSABLE) ×2
MARKER SKIN DUAL TIP RULER LAB (MISCELLANEOUS) IMPLANT
NDL HYPO 25X1 1.5 SAFETY (NEEDLE) IMPLANT
NEEDLE HYPO 25X1 1.5 SAFETY (NEEDLE) ×1 IMPLANT
NS IRRIG 1000ML POUR BTL (IV SOLUTION) IMPLANT
PACK BASIN DAY SURGERY FS (CUSTOM PROCEDURE TRAY) ×1 IMPLANT
PENCIL SMOKE EVACUATOR (MISCELLANEOUS) ×1 IMPLANT
PIN SAFETY STERILE (MISCELLANEOUS) IMPLANT
SHEET MEDIUM DRAPE 40X70 STRL (DRAPES) ×1 IMPLANT
SLEEVE SCD COMPRESS KNEE MED (STOCKING) ×1 IMPLANT
SPIKE FLUID TRANSFER (MISCELLANEOUS) IMPLANT
SPONGE T-LAP 18X18 ~~LOC~~+RFID (SPONGE) ×2 IMPLANT
STAPLER VISISTAT 35W (STAPLE) ×1 IMPLANT
STRIP CLOSURE SKIN 1/2X4 (GAUZE/BANDAGES/DRESSINGS) IMPLANT
SUT ETHILON 2 0 FS 18 (SUTURE) IMPLANT
SUT ETHILON 3 0 PS 1 (SUTURE) IMPLANT
SUT MNCRL AB 4-0 PS2 18 (SUTURE) ×1 IMPLANT
SUT PDS AB 0 CT 36 (SUTURE) IMPLANT
SUT PDS AB 2-0 CT2 27 (SUTURE) IMPLANT
SUT VIC AB 3-0 PS1 18 (SUTURE)
SUT VIC AB 3-0 PS1 18XBRD (SUTURE) IMPLANT
SUT VIC AB 3-0 SH 27 (SUTURE)
SUT VIC AB 3-0 SH 27X BRD (SUTURE) IMPLANT
SUT VICRYL 4-0 PS2 18IN ABS (SUTURE) ×1 IMPLANT
SWAB COLLECTION DEVICE MRSA (MISCELLANEOUS) IMPLANT
SWAB CULTURE ESWAB REG 1ML (MISCELLANEOUS) IMPLANT
SYR 50ML LL SCALE MARK (SYRINGE) IMPLANT
SYR BULB IRRIG 60ML STRL (SYRINGE) ×1 IMPLANT
SYR CONTROL 10ML LL (SYRINGE) IMPLANT
TOWEL GREEN STERILE FF (TOWEL DISPOSABLE) ×2 IMPLANT
TUBE CONNECTING 20X1/4 (TUBING) ×1 IMPLANT
UNDERPAD 30X36 HEAVY ABSORB (UNDERPADS AND DIAPERS) ×2 IMPLANT
YANKAUER SUCT BULB TIP NO VENT (SUCTIONS) ×1 IMPLANT

## 2022-07-22 NOTE — ED Notes (Addendum)
Per EDP, Pt was discharged to [place]. IV remains intact er EDP instruction. IV covered for protection. Pt educated not to eat or drink.

## 2022-07-22 NOTE — Op Note (Signed)
Operative Note   DATE OF OPERATION: 1.2.24  LOCATION: [facility]-observation  SURGICAL DIVISION: Plastic Surgery  PREOPERATIVE DIAGNOSES:  1. S/p reduction mammaplasty 2. Necrosis left nipple areola complex 3. Open wound left breast  POSTOPERATIVE DIAGNOSES:  same  PROCEDURE:  1. Excisional debridement left breast skin and subcutaneous tissue 50 cm2 2. Layered closure left breast 15 cm  SURGEON: Irene Limbo MD MBA  ASSISTANT: none  ANESTHESIA:  General.   EBL: 50 ml  COMPLICATIONS: None immediate.   INDICATIONS FOR PROCEDURE:  The patient, Gail Brewer, is a 47 y.o. female born on [birth year], is here for debridement left breast following breast reduction with left nipple areolar complex necrosis and dehiscence partial left breast incisions.   FINDINGS: Full thickness eschar left nipple areolar complex noted and excised. Thrombosis vessels noted through distal pedicle.   DESCRIPTION OF PROCEDURE:  The patient's operative site was marked with the patient in the preoperative area. The patient was taken to the operating room. SCDs were placed and IV antibiotics were given. The patient's operative site was prepped and draped in a sterile fashion. A time out was performed and all information was confirmed to be correct. Sharp excision with knife completed of skin margins vertical skin closure. Inset of nipple areola complex released. Tangential debridement of left breast with knife and scissors completed of left nipple areola skin and breast tissue and fat to bleeding tissue. Total area excised 50 cm2. Wound irrigated. Local anesthetic infiltrated. 18 Fr JP placed in breast cavity and secured with 2-0 nylon. Closure completed with 0 and 2-0 PDS suture in superficial fascia and dermis. Skin closure closed with interrupted and short simple running 2-0 nylon, 3-0 nylon, and 4-0 monocryl. Total length closure 15 cm. Dry dressing applied followed by breast binder.   The patient was  allowed to wake from anesthesia, extubated and taken to the recovery room in satisfactory condition.   SPECIMENS: left breast debridement  DRAINS: 40 Fr JP in left breast  Irene Limbo, MD [facility] Plastic & Reconstructive Surgery  Office/ physician access line after hours [phone]

## 2022-07-22 NOTE — ED Notes (Signed)
Pt verbalized understanding of discharge instructions. Opportunity for questions provided.  

## 2022-07-22 NOTE — ED Provider Notes (Signed)
[facility] EMERGENCY DEPT Provider Note   CSN: [id number] Arrival date & time: 07/22/22  8657     History  Chief Complaint  Patient presents with   Breast Pain    Left    Gail Brewer is a 47 y.o. female.  Patient is a 47 year old female with a history of anemia and recent breast reduction surgery on 07/10/2022 who is presenting today with worsening pain, drainage and a smell coming from her left breast incision.  Patient reports it has always been more painful she went to the office on 07/17/2022 for her follow-up and at that time drains were removed and she reports having pain and some concerned about healing but she was told everything looked normal.  However since that time she has had worsening opening of her wound as well as increasing drainage now with a smell that started within the last 24 hours and for the last 2 days has had chills and felt slightly feverish.  She has been taking the oxycodone for pain but reports it is minimally helpful.  Her right breast seems to be healing normally.  The history is provided by the patient and medical records.       Home Medications Prior to Admission medications   Medication Sig Start Date End Date Taking? Authorizing Provider  BIOTIN PO Take 1 tablet by mouth daily.    [provider]  clindamycin (CLEOCIN) 2 % vaginal cream Place 1 Applicatorful vaginally at bedtime. 06/25/22   [provider]  clotrimazole-betamethasone (LOTRISONE) cream Apply topically 2 (two) times daily. 06/25/22   [provider]  hydrocortisone cream 1 % Apply 1 Application topically daily as needed for itching. Patient not taking: Reported on 05/08/2022    [provider]  naproxen sodium (ALEVE) 220 MG tablet Take 220 mg by mouth 2 (two) times daily as needed (pain). Patient not taking: Reported on 05/08/2022    [provider]  oxyCODONE (ROXICODONE) 5 MG immediate release tablet Take 1 tablet (5  mg total) by mouth every 4 (four) hours as needed. 07/11/22 07/11/23  Irene Limbo, MD  oxyCODONE-acetaminophen (PERCOCET/ROXICET) 5-325 MG tablet Take 1 tablet by mouth every 4 (four) hours as needed. 07/17/22   [provider]  phentermine 15 MG capsule Take 20 mg by mouth every morning.    [provider]      Allergies    Metronidazole    Review of Systems   Review of Systems  Physical Exam Updated Vital Signs BP 109/65   Pulse 71   Temp 99.3 F (37.4 C)   Resp 16   Ht 5\' 5"  (1.651 m)   Wt 104.3 kg   LMP 07/12/2022   SpO2 96%   BMI 38.27 kg/m  Physical Exam Vitals and nursing note reviewed.  Constitutional:      General: She is not in acute distress.    Appearance: She is well-developed.  HENT:     Head: Normocephalic and atraumatic.  Eyes:     Pupils: Pupils are equal, round, and reactive to light.  Cardiovascular:     Rate and Rhythm: Normal rate and regular rhythm.     Heart sounds: Normal heart sounds. No murmur heard.    No friction rub.  Pulmonary:     Effort: Pulmonary effort is normal. No respiratory distress.  Chest:     Comments: Patient's right breast seems to be healing well.  Surgical incisions are intact without drainage or erythema.  Patient's left breast  is tender, erythematous, wound dehiscence also nipple appears necrotic.  Minimal drainage from the bandage.  Incisions under the breast appear to be healing, pictures attached Musculoskeletal:        General: No tenderness. Normal range of motion.     Comments: No edema  Skin:    General: Skin is warm and dry.     Findings: No rash.  Neurological:     Mental Status: She is alert and oriented to person, place, and time.     Cranial Nerves: No cranial nerve deficit.  Psychiatric:        Behavior: Behavior normal.     ED Results / Procedures / Treatments   Labs (all labs ordered are listed, but only abnormal results are displayed) Labs Reviewed  CBC WITH  DIFFERENTIAL/PLATELET - Abnormal; Notable for the following components:      Result Value   RBC 3.28 (*)    Hemoglobin 8.3 (*)    HCT 27.2 (*)    MCH 25.3 (*)    RDW 16.5 (*)    Platelets 434 (*)    Abs Immature Granulocytes 0.11 (*)    All other components within normal limits  BASIC METABOLIC PANEL - Abnormal; Notable for the following components:   Glucose, Bld 123 (*)    All other components within normal limits    EKG None  Radiology No results found.     Procedures Procedures    Medications Ordered in ED Medications  morphine (PF) 4 MG/ML injection 4 mg (has no administration in time range)  morphine (PF) 4 MG/ML injection 4 mg (4 mg Intravenous Given 07/22/22 1057)  ondansetron (ZOFRAN) injection 4 mg (4 mg Intravenous Given 07/22/22 1057)    ED Course/ Medical Decision Making/ A&P                           Medical Decision Making Amount and/or Complexity of Data Reviewed Labs: ordered. Decision-making details documented in ED Course.  Risk Prescription drug management.   Pt  presenting today with a complaint that caries a high risk for morbidity and mortality.  Here today with concern for postop infection after having a breast reduction surgery patient also appears to be having wound dehiscence and possible necrosis from where the nipple had been attached.  Patient in the last 2 days has felt slightly feverish and in the last 24 hours has noticed a smell from her breast.  Patient has a temperature of 99.3 here.  No obvious signs of sepsis at this time.  Will discuss the case with the patient's surgeon Dr. Iran Planas.  Labs are pending.  11:32 AM I independently interpreted patient's labs and CBC and BMP are within normal limits.  Spoke with Dr. Iran Planas and discussed the patient's case.  She saw the patient's pictures and feel that patient needs debridement of the wound.  Patient will go directly to the surgical center for care.  Findings were discussed with the  patient.  She is comfortable with this plan.         Final Clinical Impression(s) / ED Diagnoses Final diagnoses:  Wound dehiscence    Rx / DC Orders ED Discharge Orders     None         Blanchie Dessert, MD 07/22/22 1132

## 2022-07-22 NOTE — ED Triage Notes (Signed)
Patient arrives with pain in her left breast. Patient had breast reduction surgery on 07/11/2022, performed at [facility]. Patient spoke with her surgeon concerning her problems and surgeon advised things were normal. However patient states the left side is significantly more painful and swollen than the left. Pain rated 10/10. Patient takes oxycodone with acetaminophen for pain, last dose at 6pm lastnight. No antibiotics prescribed.

## 2022-07-22 NOTE — Anesthesia Postprocedure Evaluation (Signed)
Anesthesia Post Note  Patient: Gail Brewer  Procedure(s) Performed: DEBRIDEMENT LEFT BREAST (Left: Breast)     Patient location during evaluation: PACU Anesthesia Type: General Level of consciousness: awake and alert, patient cooperative and oriented Pain management: pain level controlled Vital Signs Assessment: post-procedure vital signs reviewed and stable Respiratory status: spontaneous breathing, nonlabored ventilation and respiratory function stable Cardiovascular status: blood pressure returned to baseline and stable Postop Assessment: no apparent nausea or vomiting Anesthetic complications: no   No notable events documented.  Last Vitals:  Vitals:   07/22/22 1630 07/22/22 1645  BP: 117/75 104/65  Pulse: 70 71  Resp: (!) 9 13  Temp:    SpO2: 99% 93%    Last Pain:  Vitals:   07/22/22 1645  TempSrc:   PainSc: 4                  Yosselyn Tax,E. Britt Petroni

## 2022-07-22 NOTE — Anesthesia Procedure Notes (Signed)
Procedure Name: LMA Insertion Date/Time: 07/22/2022 2:47 PM  Performed by: Verita Lamb, CRNAPre-anesthesia Checklist: Patient identified, Emergency Drugs available, Suction available and Patient being monitored Patient Re-evaluated:Patient Re-evaluated prior to induction Oxygen Delivery Method: Circle system utilized Preoxygenation: Pre-oxygenation with 100% oxygen Induction Type: IV induction Ventilation: Mask ventilation without difficulty LMA: LMA inserted LMA Size: 4.0 Number of attempts: 1 Airway Equipment and Method: Bite block Placement Confirmation: positive ETCO2, breath sounds checked- equal and bilateral and CO2 detector Tube secured with: Tape Dental Injury: Teeth and Oropharynx as per pre-operative assessment

## 2022-07-22 NOTE — Transfer of Care (Signed)
Immediate Anesthesia Transfer of Care Note  Patient: Gail Brewer  Procedure(s) Performed: DEBRIDEMENT LEFT BREAST (Left: Breast)  Patient Location: PACU  Anesthesia Type:General  Level of Consciousness: awake, drowsy, and patient cooperative  Airway & Oxygen Therapy: Patient Spontanous Breathing and Patient connected to face mask oxygen  Post-op Assessment: Report given to RN and Post -op Vital signs reviewed and stable  Post vital signs: Reviewed and stable  Last Vitals:  Vitals Value Taken Time  BP 137/76 07/22/22 1554  Temp    Pulse 76 07/22/22 1555  Resp 18 07/22/22 1555  SpO2 99 % 07/22/22 1555  Vitals shown include unvalidated device data.  Last Pain:  Vitals:   07/22/22 1405  TempSrc: Oral  PainSc: 3          Complications: No notable events documented.

## 2022-07-22 NOTE — H&P (Deleted)
  The note originally documented on this encounter has been moved the the encounter in which it belongs.  

## 2022-07-22 NOTE — ED Notes (Signed)
ED Provider at bedside. 

## 2022-07-22 NOTE — Discharge Instructions (Signed)
°Post Anesthesia Home Care Instructions ° °Activity: °Get plenty of rest for the remainder of the day. A responsible individual must stay with you for 24 hours following the procedure.  °For the next 24 hours, DO NOT: °-Drive a car °-Operate machinery °-Drink alcoholic beverages °-Take any medication unless instructed by your physician °-Make any legal decisions or sign important papers. ° °Meals: °Start with liquid foods such as gelatin or soup. Progress to regular foods as tolerated. Avoid greasy, spicy, heavy foods. If nausea and/or vomiting occur, drink only clear liquids until the nausea and/or vomiting subsides. Call your physician if vomiting continues. ° °Special Instructions/Symptoms: °Your throat may feel dry or sore from the anesthesia or the breathing tube placed in your throat during surgery. If this causes discomfort, gargle with warm salt water. The discomfort should disappear within 24 hours. ° °If you had a scopolamine patch placed behind your ear for the management of post- operative nausea and/or vomiting: ° °1. The medication in the patch is effective for 72 hours, after which it should be removed.  Wrap patch in a tissue and discard in the trash. Wash hands thoroughly with soap and water. °2. You may remove the patch earlier than 72 hours if you experience unpleasant side effects which may include dry mouth, dizziness or visual disturbances. °3. Avoid touching the patch. Wash your hands with soap and water after contact with the patch. °  ° ° ° ° °JP Drain Totals °· Bring this sheet to all of your post-operative appointments while you have your drains. °· Please measure your drains by CC's or ML's. °· Make sure you drain and measure your JP Drains 2 or 3 times per day. °· At the end of each day, add up totals for the left side and add up totals for the right side. °   ( 9 am )     ( 3 pm )        ( 9 pm )                °Date L  R  L  R  L  R  Total L/R  °               °               °        °               °               °               °               °               °               °               °               °               ° ° ° ° ° °JP Drain Totals °· Bring this sheet to all of your post-operative appointments while you have your drains. °· Please measure your drains by CC's or ML's. °· Make sure you drain and measure your JP Drains 2 or 3 times per day. °· At the end of each day, add up   totals for the left side and add up totals for the right side. °   ( 9 am )     ( 3 pm )        ( 9 pm )                °Date L  R  L  R  L  R  Total L/R  °               °               °               °               °               °               °               °               °               °               °               °               ° ° ° °

## 2022-07-22 NOTE — H&P (Signed)
Subjective:  Patient ID: Gail Brewer is a 47 y.o. female.  HPI  11 d post op bilateral breast reduction. Patient seen in ED today for concern fever left breast wound.   Prior 38G. Right reduction 1001 g Left reduction 1367 g  MMG 1.30.23 normal. Denies FH breast or ovarian ca.  Works for [employer], in person. Lives with sons ages 75, 23, 46.  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 Reviewed in this setting may require repeat debridements and will take several weeks to heal, will require local wound care.

## 2022-07-22 NOTE — Discharge Instructions (Signed)
Dr. Leland Johns will see you at the surgical center today.  Do not eat or drink anything

## 2022-07-22 NOTE — Anesthesia Preprocedure Evaluation (Addendum)
Anesthesia Evaluation  Patient identified by MRN, date of birth, ID band Patient awake    Reviewed: Allergy & Precautions, NPO status , Patient's Chart, lab work & pertinent test results  History of Anesthesia Complications Negative for: history of anesthetic complications  Airway Mallampati: II  TM Distance: >3 FB Neck ROM: Full    Dental  (+) Dental Advisory Given, Teeth Intact   Pulmonary former smoker   breath sounds clear to auscultation       Cardiovascular + Valvular Problems/Murmurs (remote h/o murmur)  Rhythm:Regular Rate:Normal     Neuro/Psych  Headaches  Anxiety Depression       GI/Hepatic negative GI ROS, Neg liver ROS,,,  Endo/Other    Morbid obesityBMI 38.3  Renal/GU negative Renal ROS     Musculoskeletal   Abdominal  (+) + obese  Peds  Hematology  (+) Blood dyscrasia (Hb 8.3), anemia   Anesthesia Other Findings   Reproductive/Obstetrics LMP 07/12/2022                             Anesthesia Physical Anesthesia Plan  ASA: 2  Anesthesia Plan: General   Post-op Pain Management: Tylenol PO (pre-op)*   Induction: Intravenous  PONV Risk Score and Plan: 3 and Ondansetron, Dexamethasone and Scopolamine patch - Pre-op  Airway Management Planned: LMA  Additional Equipment: None  Intra-op Plan:   Post-operative Plan:   Informed Consent: I have reviewed the patients History and Physical, chart, labs and discussed the procedure including the risks, benefits and alternatives for the proposed anesthesia with the patient or authorized representative who has indicated his/her understanding and acceptance.     Dental advisory given  Plan Discussed with: CRNA and Surgeon  Anesthesia Plan Comments:        Anesthesia Quick Evaluation

## 2022-07-22 NOTE — ED Notes (Signed)
Unsuccessful blood draw attempt x2.  Another RN asked to attempt.

## 2022-07-23 ENCOUNTER — Encounter (HOSPITAL_BASED_OUTPATIENT_CLINIC_OR_DEPARTMENT_OTHER): Payer: Self-pay | Admitting: Plastic Surgery

## 2022-07-23 NOTE — Discharge Summary (Signed)
Physician Discharge Summary  Patient ID: Gail Brewer MRN: [id number] DOB/AGE: 1976-02-16 47 y.o.  Admit date: 07/22/2022 Discharge date: 07/23/2022  Admission Diagnoses: S/p reduction mammaplasty, skin flap necrosis left breast, open wound left breast  Discharge Diagnoses:  same  Discharged Condition: stable  Hospital Course: Patient underwent debridement left breast for left nipple areola complex necrosis and dehiscence partial incision following left breast reduction. Post operatively patient able to ambulate with minimal assist, tolerated diet and pain controlled.   Treatments: surgery: debridement left breast 1.2.24  Discharge Exam: Blood pressure 130/79, pulse 64, temperature 98.1 F (36.7 C), resp. rate 16, height 5\' 5"  (1.651 m), weight 101.3 kg, last menstrual period 07/12/2022, SpO2 100 %, unknown if currently breastfeeding. Incision/Wound: right breast soft benign left breast incisions intact with scant drainage drain watery old blood  Disposition: Discharge disposition: 01-Home or Self Care       Discharge Instructions     Call MD for:  redness, tenderness, or signs of infection (pain, swelling, bleeding, redness, odor or green/yellow discharge around incision site)   Complete by: As directed    Call MD for:  temperature >100.5   Complete by: As directed    Discharge instructions   Complete by: As directed    Ok to remove dressings and shower am 1.3.23. Soap and water ok, pat incisions dry. Do not let drains dangle in shower, attach to lanyard or similar. Strip and record drain twice daily and bring log to clinic visit.  Breast binder or soft compression bra for comfort only, may leave open to air.   Ok to raise arms above shoulders for bathing and dressing.  No house yard work or exercise until cleared by MD.   Recommend Miralax or Dulcolax as needed for constipation.   Driving Restrictions   Complete by: As directed    No driving if taking prescription  pain medication   Lifting restrictions   Complete by: As directed    No lifting > 5-10 lbs until cleared by MD   Resume previous diet   Complete by: As directed       Allergies as of 07/23/2022       Reactions   Metronidazole Other (See Comments)   Yeast infection        Medication List     STOP taking these medications    oxyCODONE 5 MG immediate release tablet Commonly known as: Roxicodone       TAKE these medications    BIOTIN PO Take 1 tablet by mouth daily.   clindamycin 2 % vaginal cream Commonly known as: CLEOCIN Place 1 Applicatorful vaginally at bedtime.   clotrimazole-betamethasone cream Commonly known as: LOTRISONE Apply topically 2 (two) times daily.   hydrocortisone cream 1 % Apply 1 Application topically daily as needed for itching.   naproxen sodium 220 MG tablet Commonly known as: ALEVE Take 220 mg by mouth 2 (two) times daily as needed (pain).   oxyCODONE-acetaminophen 5-325 MG tablet Commonly known as: PERCOCET/ROXICET Take 1-2 tablets by mouth every 6 (six) hours as needed. What changed:  how much to take when to take this   phentermine 15 MG capsule Take 20 mg by mouth every morning.        Follow-up Information     Irene Limbo, MD Follow up on 07/28/2022.   Specialty: Plastic Surgery Why: 130 pm Contact information: [address] [place] [address] [phone]  SignedIrene Limbo 07/23/2022, 7:36 AM

## 2022-07-24 LAB — SURGICAL PATHOLOGY

## 2022-07-28 ENCOUNTER — Emergency Department (HOSPITAL_COMMUNITY)
Admission: EM | Admit: 2022-07-28 | Discharge: 2022-07-28 | Disposition: A | Discharge status: Home / Self Care | Payer: Medicaid Other | Attending: Emergency Medicine | Admitting: Emergency Medicine

## 2022-07-28 DIAGNOSIS — G8918 Other acute postprocedural pain: Secondary | ICD-10-CM | POA: Insufficient documentation

## 2022-07-28 DIAGNOSIS — Z4801 Encounter for change or removal of surgical wound dressing: Secondary | ICD-10-CM | POA: Diagnosis not present

## 2022-07-28 DIAGNOSIS — Z5189 Encounter for other specified aftercare: Secondary | ICD-10-CM

## 2022-07-28 LAB — LACTIC ACID, PLASMA: Lactic Acid, Venous: 1.4 mmol/L (ref 0.5–1.9)

## 2022-07-28 LAB — COMPREHENSIVE METABOLIC PANEL
ALT: 23 U/L (ref 0–44)
AST: 22 U/L (ref 15–41)
Albumin: 2.8 g/dL — ABNORMAL LOW (ref 3.5–5.0)
Alkaline Phosphatase: 78 U/L (ref 38–126)
Anion gap: 8 (ref 5–15)
BUN: 9 mg/dL (ref 6–20)
CO2: 24 mmol/L (ref 22–32)
Calcium: 8.6 mg/dL — ABNORMAL LOW (ref 8.9–10.3)
Chloride: 103 mmol/L (ref 98–111)
Creatinine, Ser: 0.73 mg/dL (ref 0.44–1.00)
GFR, Estimated: >60 mL/min (ref >60)
Glucose, Bld: 115 mg/dL — ABNORMAL HIGH (ref 70–99)
Potassium: 4 mmol/L (ref 3.5–5.1)
Sodium: 135 mmol/L (ref 135–145)
Total Bilirubin: 1 mg/dL (ref 0.3–1.2)
Total Protein: 6.9 g/dL (ref 6.5–8.1)

## 2022-07-28 LAB — CBC WITH DIFFERENTIAL/PLATELET
Abs Immature Granulocytes: 0.04 10*3/uL (ref 0.00–0.07)
Basophils Absolute: 0 10*3/uL (ref 0.0–0.1)
Basophils Relative: 0 %
Eosinophils Absolute: 0.4 10*3/uL (ref 0.0–0.5)
Eosinophils Relative: 4 %
HCT: 27 % — ABNORMAL LOW (ref 36.0–46.0)
Hemoglobin: 8.5 g/dL — ABNORMAL LOW (ref 12.0–15.0)
Immature Granulocytes: 1 %
Lymphocytes Relative: 30 %
Lymphs Abs: 2.5 10*3/uL (ref 0.7–4.0)
MCH: 26.2 pg (ref 26.0–34.0)
MCHC: 31.5 g/dL (ref 30.0–36.0)
MCV: 83.3 fL (ref 80.0–100.0)
Monocytes Absolute: 0.5 10*3/uL (ref 0.1–1.0)
Monocytes Relative: 6 %
Neutro Abs: 5 10*3/uL (ref 1.7–7.7)
Neutrophils Relative %: 59 %
Platelets: 470 10*3/uL — ABNORMAL HIGH (ref 150–400)
RBC: 3.24 MIL/uL — ABNORMAL LOW (ref 3.87–5.11)
RDW: 16.7 % — ABNORMAL HIGH (ref 11.5–15.5)
WBC: 8.3 10*3/uL (ref 4.0–10.5)
nRBC: 0 % (ref 0.0–0.2)

## 2022-07-28 MED ORDER — OXYCODONE-ACETAMINOPHEN 5-325 MG PO TABS: 1.0000 | ORAL_TABLET | Freq: Four times a day (QID) | ORAL | 0 refills | Status: DC | PRN

## 2022-07-28 MED ORDER — OXYCODONE-ACETAMINOPHEN 5-325 MG PO TABS
1.0000 | ORAL_TABLET | Freq: Once | ORAL | Status: AC
  Administered 2022-07-28: 1 via ORAL
  Filled 2022-07-28: qty 1

## 2022-07-28 NOTE — Discharge Instructions (Addendum)
Can sinew to take your pain medication as needed for severe pain.  You may apply ice to the affected area to reduce pain as well.  Make sure that you are following closely with Dr. Ephriam Jenkins states that she can keep a close eye on your wound.  Be reassured that it appears to be well-healing and that because you have to heal by secondary intention your clinical course will take much longer than normal.  Granulation tissue will heal but takes a long time. Get help right away if you have out pus of purulent discharge this includes creamy colored discharge from the wound, fever, surrounding redness in the tissues, severe pain.

## 2022-07-28 NOTE — ED Provider Notes (Signed)
[facility] EMERGENCY DEPARTMENT Provider Note   CSN: [id number] Arrival date & time: 07/28/22  0827     History  Chief Complaint  Patient presents with   Post-op Problem    Gail Brewer is a 47 y.o. female with a past medical history of breast reduction surgery who presents for postop complication.  Patient reports that Gail Brewer had breast surgery in December.  Gail Brewer apparently had an infection on the left side and lost the areola.  Gail Brewer had wound dehiscence and a debridement and has had and an open area at the base of her breast.  Gail Brewer has been taking oxycodone for pain relief.  Gail Brewer was post to see Dr. Iran Planas in her office today however was concerned that Gail Brewer may not have infection.  Gail Brewer denies fevers or chills.  Gail Brewer has a Jackson-Pratt drain on the left side.  HPI     Home Medications Prior to Admission medications   Medication Sig Start Date End Date Taking? Authorizing Provider  cephALEXin (KEFLEX) 250 MG capsule Take 250 mg by mouth 4 (four) times daily. 7 day course.   Yes [provider]  ibuprofen (ADVIL) 200 MG tablet Take 600 mg by mouth every 6 (six) hours as needed for headache, mild pain or moderate pain.   Yes [provider]  oxyCODONE-acetaminophen (PERCOCET/ROXICET) 5-325 MG tablet Take 1 tablet by mouth every 6 (six) hours as needed for severe pain. 07/28/22   Margarita Mail, PA-C      Allergies    Flagyl [metronidazole]    Review of Systems   Review of Systems  Physical Exam Updated Vital Signs BP 109/77   Pulse 66   Temp 98.5 F (36.9 C)   Resp (!) 21   LMP 07/12/2022 Comment: UPT neg DOS  SpO2 98%  Physical Exam Vitals and nursing note reviewed.  Constitutional:      General: Gail Brewer is not in acute distress.    Appearance: Gail Brewer is well-developed. Gail Brewer is not diaphoretic.  HENT:     Head: Normocephalic and atraumatic.     Right Ear: External ear normal.     Left Ear: External ear normal.     Nose: Nose normal.      Mouth/Throat:     Mouth: Mucous membranes are moist.  Eyes:     General: No scleral icterus.    Conjunctiva/sclera: Conjunctivae normal.  Cardiovascular:     Rate and Rhythm: Normal rate and regular rhythm.     Heart sounds: Normal heart sounds. No murmur heard.    No friction rub. No gallop.  Pulmonary:     Effort: Pulmonary effort is normal. No respiratory distress.     Breath sounds: Normal breath sounds.  Chest:     Comments: Left breast with open area.  There are multiple loose sutures on the medial border.  There appears to be granulation tissue at the base of the breast and around the area where the areola is missing.  This appears to be secreting serous sanguinous drainage.  There is mild induration but this appears to be equal to the right side where there are well-healing surgical scars.  No evidence of purulence or surrounding cellulitis.  Jackson-Pratt drain in place with serosanguineous drainage. Abdominal:     General: Bowel sounds are normal. There is no distension.     Palpations: Abdomen is soft. There is no mass.     Tenderness: There is no abdominal tenderness. There is no guarding.  Musculoskeletal:  Cervical back: Normal range of motion.  Skin:    General: Skin is warm and dry.  Neurological:     Mental Status: Gail Brewer is alert and oriented to person, place, and time.  Psychiatric:        Behavior: Behavior normal.     ED Results / Procedures / Treatments   Labs (all labs ordered are listed, but only abnormal results are displayed) Labs Reviewed  COMPREHENSIVE METABOLIC PANEL - Abnormal; Notable for the following components:      Result Value   Glucose, Bld 115 (*)    Calcium 8.6 (*)    Albumin 2.8 (*)    All other components within normal limits  CBC WITH DIFFERENTIAL/PLATELET - Abnormal; Notable for the following components:   RBC 3.24 (*)    Hemoglobin 8.5 (*)    HCT 27.0 (*)    RDW 16.7 (*)    Platelets 470 (*)    All other components within normal  limits  LACTIC ACID, PLASMA  LACTIC ACID, PLASMA    EKG None  Radiology No results found.  Procedures Procedures    Medications Ordered in ED Medications  oxyCODONE-acetaminophen (PERCOCET/ROXICET) 5-325 MG per tablet 1 tablet (1 tablet Oral Given 07/28/22 1305)    ED Course/ Medical Decision Making/ A&P                           Medical Decision Making 47 year old female with complicated postsurgical course including wound dehiscence and loss of the areola along with infection.  Today Gail Brewer does not appear to have infection and her wound appears to be well-healing.  Gail Brewer was seen at bedside by Dr. Leta Baptist as Gail Brewer was supposed to have a follow-up appointment in the office.  The patient was somewhat irritated and tearful.  Dr. Leta Baptist wanted to remove her sutures and drain however the patient did not want to have her do this.  I had all discussion with both Dr. Leta Baptist and the patient.  Dr. Leta Baptist discussed all of her complications.  I was able to reassure the patient that I agreed that her breast tissue looked like it was well-healing and that this was something that was going to take a very long time to heal by secondary intention.  Patient seemed to calm down some and allow Dr. Leta Baptist to again assess her, remove sutures and drain.  Gail Brewer then placed in for an alginate dressing.  I will discharge the patient with some more Percocet for her pain as per recommendations of Dr. Leta Baptist.  Gail Brewer has a follow-up appointment in the office this coming Wednesday and appears to be reassured that her clinical course appears to be improving.  Discussed outpatient follow-up and return precautions.  Risk Prescription drug management.           Final Clinical Impression(s) / ED Diagnoses Final diagnoses:  Post-operative pain  Visit for wound check    Rx / DC Orders ED Discharge Orders          Ordered    oxyCODONE-acetaminophen (PERCOCET/ROXICET) 5-325 MG tablet  Every 6 hours  PRN        07/28/22 1359              Arthor Captain, PA-C 07/28/22 1400    Gloris Manchester, MD 07/30/22 1155

## 2022-07-28 NOTE — Progress Notes (Addendum)
Plastic Surgery  2.5 weeks post operative bilateral breast reduction. 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 Last seen 1.4.24 in office and was scheduled for post op visit this pm. Presented to ED this am with concern infection. Reports drainage from breast. Reports drain approximately 10-15 cc twice daily. Report temp to 99 at home.  BP 109/77   Pulse 66   Temp 98.5 F (36.9 C)   Resp (!) 21   LMP 07/12/2022 Comment: UPT neg DOS  SpO2 98%    CBC    Component Value Date/Time   WBC 8.3 07/28/2022 0922   RBC 3.24 (L) 07/28/2022 0922   HGB 8.5 (L) 07/28/2022 0922   HCT 27.0 (L) 07/28/2022 0922   PLT 470 (H) 07/28/2022 0922   MCV 83.3 07/28/2022 0922   MCH 26.2 07/28/2022 0922   MCHC 31.5 07/28/2022 0922   RDW 16.7 (H) 07/28/2022 0922   LYMPHSABS 2.5 07/28/2022 0922   MONOABS 0.5 07/28/2022 0922   EOSABS 0.4 07/28/2022 0922   BASOSABS 0.0 07/28/2022 0998     PE: Gen: tearful Breasts: right breast soft incisions intact Left breast area of vertical closure open wound with pull through of nylon sutures open area 5 x 5 x 0.5 cm in greatest dimension 85% granulated base No cellulitis Drain with fat necrosis oil  A/P No evidence infection presently. Patient currently on antibiotics she was given last week for reported concern infection- recommend complete this course. Plan drain removal and suture removal today. Counseled I do not recommend attempt at reclosure wound. Reviewed inflammation that is normal part of healing at this time post operative will increase risks of recurrent wound healing problems and recommend local wound care. Counseled it will take weeks for this to completely heal and should expect drainage during this time. In hospital we have limited wound healing products. Instructed on calcium alginate daily over wound after shower and dry dressing to follow. At home would  recommend leave open to air. Plan follow up in 2 days in office. Updated ED provider.   Recommend iron supplementation given anemia. Recommend continued use of incentive spirometer.   Irene Limbo, MD [facility]  Office/ physician access line after hours [phone]

## 2022-07-28 NOTE — ED Triage Notes (Signed)
Patient here for evaluation of surgical wound on left breast from breast reduction performing on 07/11/22. Patient states she feels the wound is probably infection. Patient reports purulent drainage, warmth, and tenderness around wound. Patient is alert, oriented, afebrile in triage, and in no apparent distress at this time.

## 2022-07-28 NOTE — ED Notes (Signed)
Pt had breast reduction surgery before Christmas- was seen here after Christmas for an infection and was taken for more surgery for wound infection. Has a JP drain in-- states empties it once every 12 hours = sersanguinous drainage noted in JP drain.

## 2022-07-28 NOTE — ED Provider Triage Note (Signed)
Emergency Medicine Provider Triage Evaluation Note  Gail Brewer , a 47 y.o. female  was evaluated in triage.  Pt complains of concern for wound infection.  Or December 22 she underwent breast reduction.  On January 2 she returns to the emergency room and with the operating room.  She underwent debridement called 1/2.  She had a follow-up with 1/4.  She states during that follow-up her wound was opened, she was prescribed antibiotics.  She states her wound has been draining, has been painful since then.  Review of Systems  Positive: As above Negative: As above  Physical Exam  BP 115/72 (BP Location: Right Wrist)   Pulse 67   Temp 98.1 F (36.7 C)   Resp 16   LMP 07/12/2022 Comment: UPT neg DOS  SpO2 96%  Gen:   Awake, no distress   Resp:  Normal effort  MSK:   Moves extremities without difficulty  Other:    Medical Decision Making  Medically screening exam initiated at 9:20 AM.  Appropriate orders placed.  CORNELIOUS Brewer was informed that the remainder of the evaluation will be completed by another provider, this initial triage assessment does not replace that evaluation, and the importance of remaining in the ED until their evaluation is complete.     Gail Courier, PA-C 07/28/22 [phone]

## 2022-07-28 NOTE — ED Notes (Signed)
Dr. Darene Lamer. Removed JP drain, sutures -- discharge instructions given, no questions voiced

## 2022-08-01 ENCOUNTER — Other Ambulatory Visit: Payer: Self-pay

## 2022-08-01 ENCOUNTER — Encounter (HOSPITAL_BASED_OUTPATIENT_CLINIC_OR_DEPARTMENT_OTHER): Payer: Self-pay | Admitting: Plastic Surgery

## 2022-08-01 NOTE — Progress Notes (Signed)
   08/01/22 1546  PAT Phone Screen  Is the patient taking a GLP-1 receptor agonist? No  Do You Have Diabetes? No  Do You Have Hypertension? No  Have You Ever Been to the ER for Asthma? No  Have You Taken Oral Steroids in the Past 3 Months? No  Do you Take Phenteramine or any Other Diet Drugs? No  Recent  Lab Work, EKG, CXR? Yes  Where was this test performed? (S)  [facility] 07/28/22 [facility] 8.5/27  Do you have a history of heart problems? No  Any Recent Hospitalizations? No  Height 5\' 5"  (1.651 m)  Weight 101.3 kg  Pat Appointment Scheduled No  Reason for No Appointment Not Needed

## 2022-08-01 NOTE — H&P (Signed)
Subjective:     Patient ID: Gail Brewer is a 47 y.o. female.   HPI   3 w post op. 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 Started on silver alginate wound care last visit. Reports increased size wound.    Prior 38G. Right reduction 1001 g Left reduction 1367 g    MMG 1.30.23 normal. Denies FH breast or ovarian ca.   Works for [employer], in person. Lives with 3 sons.   Review of Systems      Objective:   Physical Exam Cardiovascular:     Rate and Rhythm: Normal rate and regular rhythm.     Heart sounds: Normal heart sounds.  Pulmonary:     Effort: Pulmonary effort is normal.     Breath sounds: Normal breath sounds.     Breasts: right breast soft few mm open area right T junction Left breast open wound two open areas last visit now one wound , 8 x 9 cm in greatest dimensions, undermining at IMF inferior to pedicle can probe to 5 cm depth, undermining beneath lateral skin margin superiorly to 7 cm Wound granulated over medial open wound and area of new separation lateral with fat necrosis present and exposed fat No cellulitis Few nylon sutures that remain that have pulled through skin- patient declines removal today. 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 Patient reports pain and does not want any procedures in office that will cause pain. Patient stated during visit that she was "over medicated." asked her if she needed refill of pain medication. Patient states she cannot move without medication and expressed frustration that she is still on medication at this time after surgery. I  agree with patient and discussed the wound of this size not anticipate and reasonable to continue to require prescription pain medication in this setting. Percocet refilled.    Recommend operative debridement within next few days. Counseled I do not anticipate another drain placement, however I also do not anticipate closure of wound. Counseled attempt at closure skin again would likely result in similar dehiscence given inflammation soft tissue. Purspose of surgery is to remove devitalied tissue and allow for local wound care. Counseled patient I anticipate several weeks for complete healing. Reviewed in mirror area of fat exposure and necrosis vs area of granulation tissue.   Reviewed adjuvant options to operative debridement including VAC placement. Reviewed mechanism of this and need to change dressing in office 2-3 times per week. I expressed concern if she would be able to tolerate VAC changes in office as she has concern with suture removal.   Patient expressed frustration at situation and desire to be seen by wound specialist as the wound is not getting better. Counseled that her current wound is best cared for by plastic surgery and not a wound center. Reviewed that nipple areolar necrosis in this setting is not just skin of nipple problem but underlying breast tissue as well.    Patient agrees to operative debridement. Declined operative times over next 3 days. Scheduled presently for 1.16.24. Instructed on moist to dry wound care until that time  and provided supplies.

## 2022-08-05 ENCOUNTER — Ambulatory Visit (HOSPITAL_BASED_OUTPATIENT_CLINIC_OR_DEPARTMENT_OTHER)
Admission: RE | Admit: 2022-08-05 | Payer: No Typology Code available for payment source | Source: Home / Self Care | Admitting: Plastic Surgery

## 2022-08-05 ENCOUNTER — Encounter (HOSPITAL_COMMUNITY): Payer: Self-pay | Admitting: Anesthesiology

## 2022-08-05 SURGERY — DEBRIDEMENT, WOUND, WITH CLOSURE
Anesthesia: General | Site: Breast | Laterality: Left

## 2022-08-05 MED ORDER — BUPIVACAINE HCL (PF) 0.5 % IJ SOLN
INTRAMUSCULAR | Status: AC
  Filled 2022-08-05: qty 30

## 2022-08-05 NOTE — Progress Notes (Signed)
Patient scheduled for surgery today for debridement and closure of left breast wound by Dr Iran Planas. When patient had not arrived at [facility] at the instructed time for her preop preparation, so at 10:20a, the [facility] receptionist called patient for update on her arrival. Patient stated to the receptionist that she was "waiting for someone to call her back, to know the protocol of what to do." She further stated that she "just did not want to have it done today." Receptionist asked patient to call Dr. Para Skeans office to notify them of the cancellation. Dr Iran Planas notified by our staff and case cancelled.

## 2022-08-05 NOTE — Congregational Nurse Program (Signed)
  Dept: [phone]   Congregational Nurse Program Note  Date of Encounter: 08/05/2022  Past Medical History: Past Medical History:  Diagnosis Date   Anemia    Anxiety    Depression    Headache(784.0)    Heart murmur    asymptomatic    Encounter Details:  CNP Questionnaire - 08/05/22 1500       Questionnaire   Ask client: Do you give verbal consent for me to treat you today? 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;Medicaid    Insurance/Financial Assistance Referral N/A    Medication N/A    Medical Provider Yes    Screening Referrals Made N/A    Medical Referrals Made [facility]    Medical Appointment Made N/A    Recently w/o PCP, now 1st time PCP visit completed due to CNs referral or appointment made N/A    Food Have Food Insecurities    Transportation N/A    Housing/Utilities No permanent housing    Interpersonal Safety N/A    Interventions Advocate/Support;Case Management;Counsel;Educate    Abnormal to Normal Screening Since Last CN Visit N/A    Screenings CN Performed N/A    Sent Client to Lab for: N/A    Did client attend any of the following based off CNs referral or appointments made? N/A    ED Visit Averted N/A    Life-Saving Intervention Made N/A             Client concerned about adequate healing of breast wound. Says she initially had surgery with Dr. Synetta Fail on 12/22 then due to an infection needed a second surgery on 1/2. Continued to have problems so she saw her PCP, Dr. Lin Landsman, on 1/11, who prescribed an antibiotic due to positive culture and also advised a referral to the [facility]. Is currently taking Doxycycline and packing wound with saline gauze per Dr's recommendation. Client says she can't get into the wound clinic until Feb. Encouraged to speak with her PCP Dr. or her nurse regarding need  to get into clinic sooner. Client then received a phone call from her Dr. Aletha Halim follow-up next week.  Volney Presser, RN, BSN

## 2022-08-07 ENCOUNTER — Emergency Department (HOSPITAL_COMMUNITY)
Admission: EM | Admit: 2022-08-07 | Discharge: 2022-08-07 | Disposition: A | Discharge status: Home / Self Care | Payer: Medicaid Other | Attending: Emergency Medicine | Admitting: Emergency Medicine

## 2022-08-07 DIAGNOSIS — S21002A Unspecified open wound of left breast, initial encounter: Secondary | ICD-10-CM

## 2022-08-07 DIAGNOSIS — T8130XA Disruption of wound, unspecified, initial encounter: Secondary | ICD-10-CM

## 2022-08-07 DIAGNOSIS — G8918 Other acute postprocedural pain: Secondary | ICD-10-CM | POA: Insufficient documentation

## 2022-08-07 DIAGNOSIS — Z87891 Personal history of nicotine dependence: Secondary | ICD-10-CM | POA: Insufficient documentation

## 2022-08-07 DIAGNOSIS — T8131XA Disruption of external operation (surgical) wound, not elsewhere classified, initial encounter: Secondary | ICD-10-CM | POA: Insufficient documentation

## 2022-08-07 HISTORY — DX: Unspecified open wound of left breast, initial encounter: S21.002A

## 2022-08-07 LAB — CBC WITH DIFFERENTIAL/PLATELET
Abs Immature Granulocytes: 0.02 10*3/uL (ref 0.00–0.07)
Basophils Absolute: 0 10*3/uL (ref 0.0–0.1)
Basophils Relative: 0 %
Eosinophils Absolute: 0.3 10*3/uL (ref 0.0–0.5)
Eosinophils Relative: 5 %
HCT: 25.9 % — ABNORMAL LOW (ref 36.0–46.0)
Hemoglobin: 8 g/dL — ABNORMAL LOW (ref 12.0–15.0)
Immature Granulocytes: 0 %
Lymphocytes Relative: 17 %
Lymphs Abs: 0.9 10*3/uL (ref 0.7–4.0)
MCH: 24.8 pg — ABNORMAL LOW (ref 26.0–34.0)
MCHC: 30.9 g/dL (ref 30.0–36.0)
MCV: 80.2 fL (ref 80.0–100.0)
Monocytes Absolute: 0.5 10*3/uL (ref 0.1–1.0)
Monocytes Relative: 10 %
Neutro Abs: 3.6 10*3/uL (ref 1.7–7.7)
Neutrophils Relative %: 68 %
Platelets: 369 10*3/uL (ref 150–400)
RBC: 3.23 MIL/uL — ABNORMAL LOW (ref 3.87–5.11)
RDW: 16.8 % — ABNORMAL HIGH (ref 11.5–15.5)
WBC: 5.3 10*3/uL (ref 4.0–10.5)
nRBC: 0 % (ref 0.0–0.2)

## 2022-08-07 LAB — BASIC METABOLIC PANEL
Anion gap: 9 (ref 5–15)
BUN: 8 mg/dL (ref 6–20)
CO2: 20 mmol/L — ABNORMAL LOW (ref 22–32)
Calcium: 8.3 mg/dL — ABNORMAL LOW (ref 8.9–10.3)
Chloride: 104 mmol/L (ref 98–111)
Creatinine, Ser: 0.82 mg/dL (ref 0.44–1.00)
GFR, Estimated: >60 mL/min (ref >60)
Glucose, Bld: 103 mg/dL — ABNORMAL HIGH (ref 70–99)
Potassium: 3.4 mmol/L — ABNORMAL LOW (ref 3.5–5.1)
Sodium: 133 mmol/L — ABNORMAL LOW (ref 135–145)

## 2022-08-07 MED ORDER — OXYCODONE HCL 5 MG PO TABS
5.0000 mg | ORAL_TABLET | Freq: Once | ORAL | Status: AC
  Administered 2022-08-07: 5 mg via ORAL
  Filled 2022-08-07: qty 1

## 2022-08-07 MED ORDER — OXYCODONE HCL 5 MG PO TABS: 5.0000 mg | ORAL_TABLET | ORAL | 0 refills | Status: DC | PRN

## 2022-08-07 MED ORDER — POTASSIUM CHLORIDE CRYS ER 20 MEQ PO TBCR
40.0000 meq | EXTENDED_RELEASE_TABLET | Freq: Once | ORAL | Status: AC
  Administered 2022-08-07: 40 meq via ORAL
  Filled 2022-08-07: qty 2

## 2022-08-07 NOTE — ED Notes (Signed)
Wet to dry dressing performed.

## 2022-08-07 NOTE — ED Triage Notes (Signed)
Pt states that she had breast reduction on 12/22 and then had to have urgent surgical washout in early January. Pt states that since then she was not placed on abx and has had wound dehiscence to L breast with serosanguinous drainage. Pt states last fever was over one week ago.

## 2022-08-07 NOTE — Congregational Nurse Program (Signed)
  Dept: [phone]   Congregational Nurse Program Note  Date of Encounter: 08/07/2022  Past Medical History: Past Medical History:  Diagnosis Date   Anemia    Anxiety    Depression    Headache(784.0)    Heart murmur    asymptomatic    Encounter Details:  CNP Questionnaire - 08/07/22 1011       Questionnaire   Ask client: Do you give verbal consent for me to treat you today? 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 Rx from PCP.   Medical Provider Yes    Screening Referrals Made N/A    Medical Referrals Made [facility]CP/[facility]    Medical Appointment Made N/A    Recently w/o PCP, now 1st time PCP visit completed due to CNs referral or appointment made N/A    Food Have Food Insecurities    Transportation N/A    Housing/Utilities No permanent housing    Interpersonal Safety N/A    Interventions Advocate/Support;Case Management;Counsel;Educate    Abnormal to Normal Screening Since Last CN Visit N/A    Screenings CN Performed N/A    Sent Client to Lab for: N/A    Did client attend any of the following based off CNs referral or appointments made? N/A    ED Visit Averted N/A    Life-Saving Intervention Made N/A            Spoke with client. Says was able to get an appt. at the wound clinic on Mon., 1/22. Is taking Sulfameth. twice/day per PCP order. Is no longer taking Doxycycline. Says may go to Urgent Care to have wound checked out before her appt. If needed. Continues to monitor. Follow-up prn.  Volney Presser, RN. BSN

## 2022-08-07 NOTE — ED Provider Triage Note (Signed)
Emergency Medicine Provider Triage Evaluation Note  Gail Brewer , a 47 y.o. female  was evaluated in triage.  Pt complains of left breast wound  Review of Systems  Positive: Dehisced wound Negative: fever  Physical Exam  BP 129/71 (BP Location: Left Arm)   Pulse 78   Temp 98.3 F (36.8 C) (Oral)   Resp 16   LMP 07/12/2022 Comment: UPT neg DOS  SpO2 99%  Gen:   Awake, no distress   Resp:  Normal effort  MSK:   Moves extremities without difficulty  Other:       Medical Decision Making  Medically screening exam initiated at 4:45 PM.  Appropriate orders placed.  Gail Brewer was informed that the remainder of the evaluation will be completed by another provider, this initial triage assessment does not replace that evaluation, and the importance of remaining in the ED until their evaluation is complete.     Gwenevere Abbot, [place] 08/07/22 1647

## 2022-08-07 NOTE — ED Provider Notes (Signed)
[facility]-EMERGENCY DEPT Provider Note  CSN: [id number] Arrival date & time: 08/07/22 1602  Chief Complaint(s) Breast Problem and Wound Dehiscence  HPI Gail Brewer is a 47 y.o. female with past medical history as below, significant for left-sided breast reduction Dr. Leta Baptist, anemia, anxiety, depression, headaches who presents to the ED with complaint of wound check.  He is status post left-sided breast reduction 12/22 Dr. Leta Baptist, her treatment course has been complicated by a wound infection status post debridement on 1/2.  She is scheduled for a repeat debridement 1/16 but patient did not show.  Patient been having discomfort to her left-sided breast tissue, concern for worsening breast wound.  She saw her plastic surgeon earlier today and expressed her dislike of the surgeon and that she knew longer want to be her patient, fired the Careers adviser.  Patient here to the ED concern for wound care, pain control and for seeking follow-up with a different surgeon.  She feels her pain is moderately well-controlled at home, she not have any fevers, she is compliant with oral antibiotics.  Denied any worsening discharge or drainage from the breast tissue on the left.  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 Authorizing Provider  oxyCODONE (ROXICODONE) 5 MG immediate release tablet Take 1 tablet (5 mg total) by mouth every 4 (four) hours as needed for up to 10 doses for severe pain. 08/07/22  Yes Tanda Rockers A, DO  cephALEXin (KEFLEX) 250 MG capsule Take 250 mg by mouth 4 (four) times daily. 7 day course.    [provider]  ibuprofen (ADVIL) 200 MG tablet Take  600 mg by mouth every 6 (six) hours as needed for headache, mild pain or moderate pain.    [provider]  oxyCODONE-acetaminophen (PERCOCET/ROXICET) 5-325 MG tablet Take 1 tablet by mouth every 6 (six) hours as needed for severe pain. 07/28/22   Arthor Captain, PA-C                                                                                                                                    Past Surgical History Past Surgical History:  Procedure Laterality Date   BREAST IMPLANT REMOVAL Left 07/22/2022   Procedure: DEBRIDEMENT LEFT BREAST;  Surgeon: Glenna Fellows, MD;  Location: [facility];  Service: Plastics;  Laterality: Left;   BREAST REDUCTION SURGERY Bilateral 07/11/2022   Procedure: MAMMARY REDUCTION  (BREAST);  Surgeon: Glenna Fellows, MD;  Location: [facility];  Service: Plastics;  Laterality: Bilateral;   CESAREAN SECTION  x 2   CESAREAN SECTION  07/24/2011   Procedure: CESAREAN SECTION;  Surgeon: Fortino Sic, MD;  Location: [facility];  Service: Gynecology;  Laterality: N/A;  Repeat Cesarian Section   RESECTION OF ABDOMINAL MASS Right 11/20/2021   Procedure: EXCISION RIGHT LOWER ABDOMINAL WALL MASS;  Surgeon: Abigail Miyamoto, MD;  Location: [facility];  Service: General;  Laterality: Right;   Family History Family History  Problem Relation Age of Onset   Kidney failure Mother    Breast cancer Paternal Grandmother     Social History Social History   Tobacco Use   Smoking status: Former    Types: Cigars   Smokeless tobacco: Never  Vaping Use   Vaping Use: Never used  Substance Use Topics   Alcohol use: Yes    Alcohol/week: 1.0 standard drink of alcohol    Types: 1 Standard drinks or equivalent per week   Drug use: No   Allergies Flagyl [metronidazole]  Review of Systems Review of Systems  Constitutional:  Negative for activity change and fever.  HENT:  Negative for facial swelling and trouble  swallowing.   Eyes:  Negative for discharge and redness.  Respiratory:  Negative for cough and shortness of breath.   Cardiovascular:  Positive for chest pain. Negative for palpitations.  Gastrointestinal:  Negative for abdominal pain and nausea.  Genitourinary:  Negative for dysuria and flank pain.  Musculoskeletal:  Negative for back pain and gait problem.  Skin:  Positive for wound. Negative for pallor and rash.  Neurological:  Negative for syncope and headaches.    Physical Exam Vital Signs  I have reviewed the triage vital signs BP 122/76   Pulse 72   Temp 98.4 F (36.9 C) (Oral)   Resp 16   LMP 07/12/2022 Comment: UPT neg DOS  SpO2 95%  Physical Exam Vitals and nursing note reviewed. Exam conducted with a chaperone present.  Constitutional:      General: She is not in acute distress.    Appearance: Normal appearance. She is obese. She is not ill-appearing or diaphoretic.  HENT:     Head: Normocephalic and atraumatic.     Right Ear: External ear normal.     Left Ear: External ear normal.     Nose: Nose normal.     Mouth/Throat:     Mouth: Mucous membranes are moist.  Eyes:     General: No scleral icterus.       Right eye: No discharge.        Left eye: No discharge.  Cardiovascular:     Rate and Rhythm: Normal rate and regular rhythm.     Pulses: Normal pulses.     Heart sounds: Normal heart sounds.  Pulmonary:     Effort: Pulmonary effort is normal. No respiratory distress.     Breath sounds: Normal breath sounds.  Chest:    Abdominal:     General: Abdomen is flat.     Palpations: Abdomen is soft.     Tenderness: There is no abdominal tenderness.  Musculoskeletal:        General: Normal range of motion.     Cervical back: Normal range of motion.     Right lower leg: No edema.     Left lower leg: No edema.  Skin:    General: Skin is warm and dry.     Capillary Refill: Capillary refill takes less than 2 seconds.       Neurological:     Mental  Status:  She is alert.  Psychiatric:        Mood and Affect: Mood normal.        Behavior: Behavior normal.     ED Results and Treatments Labs (all labs ordered are listed, but only abnormal results are displayed) Labs Reviewed  CBC WITH DIFFERENTIAL/PLATELET - Abnormal; Notable for the following components:      Result Value   RBC 3.23 (*)    Hemoglobin 8.0 (*)    HCT 25.9 (*)    MCH 24.8 (*)    RDW 16.8 (*)    All other components within normal limits  BASIC METABOLIC PANEL - Abnormal; Notable for the following components:   Sodium 133 (*)    Potassium 3.4 (*)    CO2 20 (*)    Glucose, Bld 103 (*)    Calcium 8.3 (*)    All other components within normal limits                                                                                                                          Radiology No results found.  Pertinent labs & imaging results that were available during my care of the patient were reviewed by me and considered in my medical decision making (see MDM for details).  Medications Ordered in ED Medications  oxyCODONE (Oxy IR/ROXICODONE) immediate release tablet 5 mg (5 mg Oral Given 08/07/22 1828)  potassium chloride SA (KLOR-CON M) CR tablet 40 mEq (40 mEq Oral Given 08/07/22 1831)                                                                                                                                     Procedures Procedures  (including critical care time)  Medical Decision Making / ED Course   MDM:  JAZMEN LINDENBAUM is a 47 y.o. female with past medical history as below, significant for left-sided breast reduction Dr. Leta Baptist, anemia, anxiety, depression, headaches who presents to the ED with complaint of wound check.. The complaint involves an extensive differential diagnosis and also carries with it a high risk of complications and morbidity.  Serious etiology was considered. Ddx includes but is not limited to: Infection, systemic infection, postop pain,,  etc.  On initial assessment the patient is: She is resting comfortably, no acute distress, hemodynamically stable.  Afebrile.  Collect screening labs. Vital signs and nursing notes were  reviewed    I discussed with patient's plastic surgeon Dr. Iran Planas, obtain further history in regard to the patient's care.  Surgeon has reviewed the photos from today and reports the wound is healing appropriately, she reports the patient fired her in the clinic today.  Dr. Iran Planas reports should be happy to see the patient for further care and additional surgeries but this is up to the patient ultimately.  Labs reviewed and are stable.  Wound care was performed, new wet-to-dry dressing was applied.  She is clinically stable.  Not septic.  Pain well-controlled.  Discussed outpatient follow-up options with plastic surgery, advised to follow-up with her surgeon also given other local options in the area to follow-up with.  I addressed the patient's concerns.  Advised that it may be very difficult to find another surgeon to perform further surgeries on her breast as has already been operated on by different surgical team.  The patient improved significantly and was discharged in stable condition. Detailed discussions were had with the patient regarding current findings, and need for close f/u with PCP or on call doctor. The patient has been instructed to return immediately if the symptoms worsen in any way for re-evaluation. Patient verbalized understanding and is in agreement with current care plan. All questions answered prior to discharge.    Additional history obtained: -Additional history obtained from na -External records from outside source obtained and reviewed including: Chart review including previous notes, labs, imaging, consultation notes including prior ED visits, prior labs and imaging, primary care documentation home medications   Lab Tests: -I ordered, reviewed, and interpreted labs.   The  pertinent results include:   Labs Reviewed  CBC WITH DIFFERENTIAL/PLATELET - Abnormal; Notable for the following components:      Result Value   RBC 3.23 (*)    Hemoglobin 8.0 (*)    HCT 25.9 (*)    MCH 24.8 (*)    RDW 16.8 (*)    All other components within normal limits  BASIC METABOLIC PANEL - Abnormal; Notable for the following components:   Sodium 133 (*)    Potassium 3.4 (*)    CO2 20 (*)    Glucose, Bld 103 (*)    Calcium 8.3 (*)    All other components within normal limits    Notable for anemia similar to baseline, no leukocytosis.  Mild hyponatremia and hypokalemia.  EKG   EKG Interpretation  Date/Time:    Ventricular Rate:    PR Interval:    QRS Duration:   QT Interval:    QTC Calculation:   R Axis:     Text Interpretation:           Imaging Studies ordered: na  Medicines ordered and prescription drug management: Meds ordered this encounter  Medications   oxyCODONE (Oxy IR/ROXICODONE) immediate release tablet 5 mg   potassium chloride SA (KLOR-CON M) CR tablet 40 mEq   oxyCODONE (ROXICODONE) 5 MG immediate release tablet    Sig: Take 1 tablet (5 mg total) by mouth every 4 (four) hours as needed for up to 10 doses for severe pain.    Dispense:  10 tablet    Refill:  0    -I have reviewed the patients home medicines and have made adjustments as needed   Consultations Obtained: I discussed pt care with the Dr. Iran Planas,  and discussed lab and imaging findings as well as pertinent plan - they recommend: As above   Cardiac Monitoring: The patient was maintained  on a cardiac monitor.  I personally viewed and interpreted the cardiac monitored which showed an underlying rhythm of: NSR  Social Determinants of Health:  Diagnosis or treatment significantly limited by social determinants of health: na   Reevaluation: After the interventions noted above, I reevaluated the patient and found that they have improved  Co morbidities that complicate the  patient evaluation  Past Medical History:  Diagnosis Date   Anemia    Anxiety    Depression    Headache(784.0)    Heart murmur    asymptomatic      Dispostion: Disposition decision including need for hospitalization was considered, and patient discharged from emergency department.    Final Clinical Impression(s) / ED Diagnoses Final diagnoses:  Wound dehiscence  Post-op pain     This chart was dictated using voice recognition software.  Despite best efforts to proofread,  errors can occur which can change the documentation meaning.    Jeanell Sparrow, DO 08/07/22 2015

## 2022-08-07 NOTE — Discharge Instructions (Addendum)
Please follow-up with wound care appointment on Monday  Please call for plastic surgery, consider seeing Dr. Doy Mince who is another surgeon at Dr. Para Ske[facility] practice may be able to see you for continuation of your care. If unable to see Dr Doy Mince or Dr Iran Planas you can try establishing care with Dr Marla Roe or Dr Jaclynn Guarneri.   It was a pleasure caring for you today in the emergency department.   Please return to the emergency department for any worsening or worrisome symptoms.

## 2022-08-11 ENCOUNTER — Encounter (HOSPITAL_BASED_OUTPATIENT_CLINIC_OR_DEPARTMENT_OTHER): Payer: Medicaid Other | Attending: Internal Medicine | Admitting: Internal Medicine

## 2022-08-11 ENCOUNTER — Other Ambulatory Visit: Payer: Self-pay

## 2022-08-11 ENCOUNTER — Emergency Department (HOSPITAL_COMMUNITY)
Admission: EM | Admit: 2022-08-11 | Discharge: 2022-08-11 | Disposition: A | Discharge status: Home / Self Care | Payer: Medicaid Other | Attending: Emergency Medicine | Admitting: Emergency Medicine

## 2022-08-11 DIAGNOSIS — N641 Fat necrosis of breast: Secondary | ICD-10-CM | POA: Diagnosis not present

## 2022-08-11 DIAGNOSIS — T8131XA Disruption of external operation (surgical) wound, not elsewhere classified, initial encounter: Secondary | ICD-10-CM | POA: Diagnosis present

## 2022-08-11 DIAGNOSIS — S21002A Unspecified open wound of left breast, initial encounter: Secondary | ICD-10-CM

## 2022-08-11 DIAGNOSIS — Z1152 Encounter for screening for COVID-19: Secondary | ICD-10-CM | POA: Diagnosis not present

## 2022-08-11 DIAGNOSIS — R519 Headache, unspecified: Secondary | ICD-10-CM | POA: Insufficient documentation

## 2022-08-11 DIAGNOSIS — R590 Localized enlarged lymph nodes: Secondary | ICD-10-CM | POA: Diagnosis not present

## 2022-08-11 LAB — RESP PANEL BY RT-PCR (RSV, FLU A&B, COVID)  RVPGX2
Influenza A by PCR: NEGATIVE
Influenza B by PCR: NEGATIVE
Resp Syncytial Virus by PCR: NEGATIVE
SARS Coronavirus 2 by RT PCR: NEGATIVE

## 2022-08-11 MED ORDER — KETOROLAC TROMETHAMINE 15 MG/ML IJ SOLN
15.0000 mg | Freq: Once | INTRAMUSCULAR | Status: AC
  Administered 2022-08-11: 15 mg via INTRAMUSCULAR
  Filled 2022-08-11: qty 1

## 2022-08-11 NOTE — ED Provider Triage Note (Signed)
Emergency Medicine Provider Triage Evaluation Note  Gail Brewer , a 47 y.o. female  was evaluated in triage.  Pt complains of headache, light sensitivity for the last 2 days.  Patient denies history of migraines.  Patient was complaining of bodyaches and chills.  Patient denies sick contacts.  Patient states she has taken ibuprofen, pain medication at home without relief.  Patient denies any one-sided weakness or numbness, nausea or vomiting.  Review of Systems  Positive:  Negative:   Physical Exam  BP 111/71 (BP Location: Right Arm)   Pulse 86   Temp 98.4 F (36.9 C) (Oral)   Resp 18   LMP 07/12/2022 Comment: UPT neg DOS  SpO2 99%  Gen:   Awake, no distress   Resp:  Normal effort  MSK:   Moves extremities without difficulty  Other:  No focal neurodeficits  Medical Decision Making  Medically screening exam initiated at 11:22 AM.  Appropriate orders placed.  Gail Brewer was informed that the remainder of the evaluation will be completed by another provider, this initial triage assessment does not replace that evaluation, and the importance of remaining in the ED until their evaluation is complete.     Azucena Cecil, PA-C 08/11/22 1123

## 2022-08-11 NOTE — Progress Notes (Signed)
Gail Brewer, Gail Brewer ([id number]) [phone] [id number] Page 1 of 4 Visit Report for 08/11/2022 Abuse Risk Screen Details Patient Name: Date of Service: Gail Brewer, Gail RA Brewer. 08/11/2022 9:15 A M Medical Record Number: [id number] Patient Account Number: [id number] Date of Birth/Sex: Treating RN: 10/06/1975 (47 y.o. F) Primary Care Cheryllynn Sarff: Lin Landsman Other Clinician: Referring Dain Laseter: Treating Didi Ganaway/Extender: Lucrezia Europe Weeks in Treatment: 0 Abuse Risk Screen Items Answer ABUSE RISK SCREEN: Has anyone close to you tried to hurt or harm you recentlyo No Do you feel uncomfortable with anyone in your familyo No Has anyone forced you do things that you didnt want to doo No Electronic Signature(s) Signed: 08/11/2022 4:49:41 PM By: Erenest Blank Entered By: Erenest Blank on 08/11/2022 09:32:39 -------------------------------------------------------------------------------- Activities of Daily Living Details Patient Name: Date of Service: Gail Brewer, Gail RA Brewer. 08/11/2022 9:15 A M Medical Record Number: [id number] Patient Account Number: [id number] Date of Birth/Sex: Treating RN: February 15, 1976 (47 y.o. F) Primary Care Lititia Sen: Lin Landsman Other Clinician: Referring Jahson Emanuele: Treating Navin Dogan/Extender: Lucrezia Europe Weeks in Treatment: 0 Activities of Daily Living Items Answer Activities of Daily Living (Please select one for each item) Drive Automobile Completely Able T Medications ake Completely Able Use T elephone Completely Able Care for Appearance Completely Able Use T oilet Completely Able Bath / Shower Completely Able Dress Self Completely Able Feed Self Completely Able Walk Completely Able Get In / Out Bed Completely Able Housework Completely Able Prepare Meals Completely Able Handle Money Completely Able Shop for Self Completely Able Electronic Signature(s) Signed: 08/11/2022 4:49:41 PM By: Erenest Blank Entered By: Erenest Blank on 08/11/2022 Gail Brewer, Gail Brewer ([id number]) [id number] Page 2 of 4 -------------------------------------------------------------------------------- Education Screening Details Patient Name: Date of Service: Gail Brewer, Gail RA Brewer. 08/11/2022 9:15 A M Medical Record Number: [id number] Patient Account Number: [id number] Date of Birth/Sex: Treating RN: 10-24-1975 (47 y.o. F) Primary Care Thatiana Renbarger: Lin Landsman Other Clinician: Referring Benny Deutschman: Treating Galen Malkowski/Extender: Mickel Baas in Treatment: 0 Primary Learner Assessed: Patient Learning Preferences/Education Level/Primary Language Learning Preference: Explanation, Demonstration, Printed Material Highest Education Level: College or Above Preferred Language: English Cognitive Barrier Language Barrier: No Translator Needed: No Memory Deficit: No Emotional Barrier: No Cultural/Religious Beliefs Affecting Medical Care: No Physical Barrier Impaired Vision: Yes Glasses Impaired Hearing: No Decreased Hand dexterity: No Knowledge/Comprehension Knowledge Level: High Comprehension Level: High Ability to understand written instructions: High Ability to understand verbal instructions: High Motivation Anxiety Level: Calm Cooperation: Cooperative Education Importance: Acknowledges Need Interest in Health Problems: Asks Questions Perception: Coherent Willingness to Engage in Self-Management High Activities: Readiness to Engage in Self-Management High Activities: Electronic Signature(s) Signed: 08/11/2022 4:49:41 PM By: Erenest Blank Entered By: Erenest Blank on 08/11/2022 09:33:47 -------------------------------------------------------------------------------- Fall Risk Assessment Details Patient Name: Date of Service: Gail Brewer, Gail RA Brewer. 08/11/2022 9:15 A M Medical Record Number: [id number] Patient Account Number:  [id number] Date of Birth/Sex: Treating RN: 05-Dec-1975 (47 y.o. F) Primary Care Jenilee Franey: Lin Landsman Other Clinician: Referring Kiala Faraj: Treating Keyondra Lagrand/Extender: Lucrezia Europe Weeks in Treatment: 0 Fall Risk Assessment Items Have you had 2 or more falls in the last 12 monthso 0 No Gail Brewer, Gail Brewer ([id number]) [phone] [id number] Page 3 of 4 Have you had any fall that resulted in injury in the last 12 monthso 0 No FALLS RISK SCREEN History of falling - immediate or within 3 months 0 No Secondary diagnosis (Do you have 2 or more medical diagnoseso) 0 No Ambulatory aid None/bed rest/wheelchair/nurse 0 No Crutches/cane/walker  0 No Furniture 0 No Intravenous therapy Access/Saline/Heparin Lock 0 No Gait/Transferring Normal/ bed rest/ wheelchair 0 No Weak (short steps with or without shuffle, stooped but able to lift head while walking, may seek 0 No support from furniture) Impaired (short steps with shuffle, may have difficulty arising from chair, head down, impaired 0 No balance) Mental Status Oriented to own ability 0 Yes Electronic Signature(s) Signed: 08/11/2022 4:49:41 PM By: Erenest Blank Entered By: Erenest Blank on 08/11/2022 09:34:19 -------------------------------------------------------------------------------- Foot Assessment Details Patient Name: Date of Service: Gail Brewer, Gail RA Brewer. 08/11/2022 9:15 A M Medical Record Number: [id number] Patient Account Number: [id number] Date of Birth/Sex: Treating RN: July 22, 1975 (47 y.o. F) Primary Care Esti Demello: Lin Landsman Other Clinician: Referring Yaire Kreher: Treating Timofey Carandang/Extender: Lucrezia Europe Weeks in Treatment: 0 Foot Assessment Items Site Locations + = Sensation present, - = Sensation absent, C = Callus, U = Ulcer R = Redness, W = Warmth, M = Maceration, PU = Pre-ulcerative lesion F = Fissure, S = Swelling, D = Dryness Assessment Right:  Left: Other Deformity: No No Prior Foot Ulcer: No No Prior Amputation: No No Charcot Joint: No No Ambulatory Status: Ambulatory Without Help Gait: Steady Gail Brewer, Gail Brewer ([id number]) [id number] Page 4 of 4 Notes Breast wound Electronic Signature(s) Signed: 08/11/2022 4:49:41 PM By: Erenest Blank Entered By: Erenest Blank on 08/11/2022 09:35:53 -------------------------------------------------------------------------------- Nutrition Risk Screening Details Patient Name: Date of Service: Gail Brewer, Gail RA Brewer. 08/11/2022 9:15 A M Medical Record Number: [id number] Patient Account Number: [id number] Date of Birth/Sex: Treating RN: 1976-05-16 (47 y.o. F) Primary Care Zeya Balles: Lin Landsman Other Clinician: Referring Bow Buntyn: Treating Dorise Gangi/Extender: Lucrezia Europe Weeks in Treatment: 0 Height (in): 65 Weight (lbs): 230 Body Mass Index (BMI): 38.3 Nutrition Risk Screening Items Score Screening NUTRITION RISK SCREEN: I have an illness or condition that made me change the kind and/or amount of food I eat 2 Yes I eat fewer than two meals per day 3 Yes I eat few fruits and vegetables, or milk products 2 Yes I have three or more drinks of beer, liquor or wine almost every day 0 No I have tooth or mouth problems that make it hard for me to eat 0 No I don't always have enough money to buy the food I need 0 No I eat alone most of the time 0 No I take three or more different prescribed or over-the-counter drugs a day 0 No Without wanting to, I have lost or gained 10 pounds in the last six months 2 Yes I am not always physically able to shop, cook and/or feed myself 0 No Nutrition Protocols Good Risk Protocol Moderate Risk Protocol High Risk Proctocol 0 Provide education on nutrition Risk Level: High Risk Score: 9 Electronic Signature(s) Signed: 08/11/2022 4:49:41 PM By: Erenest Blank Entered By: Erenest Blank on 08/11/2022  09:35:14

## 2022-08-11 NOTE — Discharge Instructions (Signed)
Return for any problem.  Use oxycodone, Tylenol, and ibuprofen as instructed for pain.  Drink plenty of fluids.

## 2022-08-11 NOTE — ED Triage Notes (Signed)
Pt reports headache with n/v,  sensitivity to light and sound x3 days. Pt reports knots behind ears.

## 2022-08-11 NOTE — ED Provider Notes (Signed)
[facility] Provider Note   CSN: [id number] Arrival date & time: 08/11/22  1035     History  Chief Complaint  Patient presents with   Headache    Gail Brewer is a 47 y.o. female.  47 year old female with prior medical history as detailed below presents for evaluation.  Patient complains of myalgias, mild headache, mild congestion x 2 to 3 days.  She denies fever.  She denies cough.  She denies shortness of breath.  Patient reports using ibuprofen and oxycodone at home with minimal improvement.  Patient reports feeling some "small bumps" behind her ears. These are tender.  The reported headache is not the worst of her life.  She denies photophobia.  She denies focal weakness.  She denies associated nausea or vomiting.   The history is provided by the patient and medical records.       Home Medications Prior to Admission medications   Medication Sig Start Date End Date Taking? Authorizing Provider  cephALEXin (KEFLEX) 250 MG capsule Take 250 mg by mouth 4 (four) times daily. 7 day course.    [provider]  ibuprofen (ADVIL) 200 MG tablet Take 600 mg by mouth every 6 (six) hours as needed for headache, mild pain or moderate pain.    [provider]  oxyCODONE (ROXICODONE) 5 MG immediate release tablet Take 1 tablet (5 mg total) by mouth every 4 (four) hours as needed for up to 10 doses for severe pain. 08/07/22   Jeanell Sparrow, DO  oxyCODONE-acetaminophen (PERCOCET/ROXICET) 5-325 MG tablet Take 1 tablet by mouth every 6 (six) hours as needed for severe pain. 07/28/22   Margarita Mail, PA-C      Allergies    Flagyl [metronidazole]    Review of Systems   Review of Systems  All other systems reviewed and are negative.   Physical Exam Updated Vital Signs BP 111/71 (BP Location: Right Arm)   Pulse 86   Temp 98.4 F (36.9 C) (Oral)   Resp 18   LMP 07/12/2022 Comment: UPT neg DOS  SpO2 99%  Physical  Exam Vitals and nursing note reviewed.  Constitutional:      General: She is not in acute distress.    Appearance: Normal appearance. She is well-developed.  HENT:     Head: Normocephalic and atraumatic.  Eyes:     General: No visual field deficit.    Extraocular Movements: Extraocular movements intact.     Conjunctiva/sclera: Conjunctivae normal.     Pupils: Pupils are equal, round, and reactive to light.  Neck:     Meningeal: Brudzinski's sign and Kernig's sign absent.     Comments: Diffuse superficial shotty lymphadenopathy distributed across the posterior auricular and lateral neck bilaterally.  No single discrete enlarged lymph node appreciated.  Mild postnasal drip appreciated on exam of oropharynx Cardiovascular:     Rate and Rhythm: Normal rate and regular rhythm.     Heart sounds: Normal heart sounds.  Pulmonary:     Effort: Pulmonary effort is normal. No respiratory distress.     Breath sounds: Normal breath sounds.  Abdominal:     General: There is no distension.     Palpations: Abdomen is soft.     Tenderness: There is no abdominal tenderness.  Musculoskeletal:        General: No deformity. Normal range of motion.     Cervical back: Normal range of motion and neck supple. No rigidity.  Lymphadenopathy:  Cervical: Cervical adenopathy present.  Skin:    General: Skin is warm and dry.  Neurological:     General: No focal deficit present.     Mental Status: She is alert and oriented to person, place, and time.     GCS: GCS eye subscore is 4. GCS verbal subscore is 5. GCS motor subscore is 6.     Cranial Nerves: No cranial nerve deficit, dysarthria or facial asymmetry.     Sensory: No sensory deficit.     Motor: No weakness.     Coordination: Romberg sign negative. Coordination normal.     ED Results / Procedures / Treatments   Labs (all labs ordered are listed, but only abnormal results are displayed) Labs Reviewed  RESP PANEL BY RT-PCR (RSV, FLU A&B,  COVID)  [id number]    EKG None  Radiology No results found.  Procedures Procedures    Medications Ordered in ED Medications  ketorolac (TORADOL) 15 MG/ML injection 15 mg (has no administration in time range)    ED Course/ Medical Decision Making/ A&P                             Medical Decision Making Risk Prescription drug management.    Medical Screen Complete  This patient presented to the ED with complaint of headache, URI.  This complaint involves an extensive number of treatment options. The initial differential diagnosis includes, but is not limited to, viral URI  This presentation is: Acute, Self-Limited, and Previously Undiagnosed  Patient is presenting with constellation of symptoms most consistent with likely viral URI.  Patient is reassured by ED evaluation.  Patient without indication for additional ED workup.  Patient without significant findings on exam suggestive of or significant acute pathology.  Patient understands need for close outpatient follow-up.  Strict return precautions given and understood.  Additional history obtained: External records from outside sources obtained and reviewed including prior ED visits and prior Inpatient records.    Lab Tests:  I ordered and personally interpreted labs.  The pertinent results include: COVID, flu, RSV   Problem List / ED Course:  Headache, congestion, viral URI   Reevaluation:  After the interventions noted above, I reevaluated the patient and found that they have: improved   Disposition:  After consideration of the diagnostic results and the patients response to treatment, I feel that the patent would benefit from close outpatient follow-up.          Final Clinical Impression(s) / ED Diagnoses Final diagnoses:  Acute nonintractable headache, unspecified headache type    Rx / DC Orders ED Discharge Orders     None      Viral URI, etc.   Valarie Merino, MD 08/11/22  1437

## 2022-08-12 NOTE — Congregational Nurse Program (Signed)
  Dept: [phone]   Congregational Nurse Program Note  Date of Encounter: 08/12/2022  Past Medical History: Past Medical History:  Diagnosis Date   Anemia    Anxiety    Depression    Headache(784.0)    Heart murmur    asymptomatic    Encounter Details:  CNP Questionnaire - 08/12/22 1530       Questionnaire   Ask client: Do you give verbal consent for me to treat you today? 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 Rx from PCP.   Medical Provider Yes    Screening Referrals Made N/A    Medical Referrals Made N/A    Medical Appointment Made N/A    Recently w/o PCP, now 1st time PCP visit completed due to CNs referral or appointment made N/A    Food Have Food Insecurities    Transportation N/A    Housing/Utilities No permanent housing    Interpersonal Safety N/A    Interventions Advocate/Support;Case Management;Counsel;Educate    Abnormal to Normal Screening Since Last CN Visit N/A    Screenings CN Performed N/A    Sent Client to Lab for: N/A    Did client attend any of the following based off CNs referral or appointments made? N/A    ED Visit Averted N/A    Life-Saving Intervention Made N/A             Says was seen at the wound clinic yesterday. Was evaluated and told they would refer her to another Dr. Aleene Davidson to follow-up tomorrow if she has not received a call for an appt. Also, says was seen in the ER yest. due to upper respiratory symptoms and headache. Was given med. for her headache and tested for COVID, flu, and RSV. Will review her record for results. Remains on an antibiotic. Follow-up prn.  Volney Presser, RN, BSN

## 2022-08-13 NOTE — Progress Notes (Signed)
Gail Brewer ([id number]) [phone].pdf Page 1 of 10 Visit Report for 08/11/2022 Chief Complaint Document Details Patient Name: Date of Service: Gail MPBELL, DA RA Brewer. 08/11/2022 9:15 A M Medical Record Number: [id number] Patient Account Number: [id number] Date of Birth/Sex: Treating RN: May 01, 1976 (47 y.o. F) Primary Care Provider: Leilani Brewer Other Clinician: Referring Provider: Treating Provider/Extender: Barnett Abu Weeks in Treatment: 0 Information Obtained from: Patient Chief Complaint 08/11/2022; open wound to the left breast secondary to wound dehiscence status post breast reduction Electronic Signature(s) Signed: 08/11/2022 12:02:25 PM By: Geralyn Corwin DO Entered By: Geralyn Corwin on 08/11/2022 09:49:34 -------------------------------------------------------------------------------- Debridement Details Patient Name: Date of Service: Gail MPBELL, DA RA Brewer. 08/11/2022 9:15 A M Medical Record Number: [id number] Patient Account Number: [id number] Date of Birth/Sex: Treating RN: 08/03/1975 (47 y.o. Arta Silence Primary Care Provider: Leilani Brewer Other Clinician: Referring Provider: Treating Provider/Extender: Barnett Abu Weeks in Treatment: 0 Debridement Performed for Assessment: Wound #1 Left Breast Performed By: Physician Geralyn Corwin, DO Debridement Type: Chemical/Enzymatic/Mechanical Agent Used: gauze and wound cleanser Level of Consciousness (Pre-procedure): Awake and Alert Pre-procedure Verification/Time Out No Taken: Bleeding: None Response to Treatment: Procedure was tolerated well Level of Consciousness (Post- Awake and Alert procedure): Post Debridement Measurements of Total Wound Length: (cm) 10 Width: (cm) 13 Depth: (cm) 3.3 Volume: (cm) 336.936 Character of Wound/Ulcer Post Debridement: Stable Post Procedure Diagnosis Same as Pre-procedure Electronic Signature(s) Signed:  08/11/2022 12:02:25 PM By: Geralyn Corwin DO Signed: 08/12/2022 6:38:55 PM By: Shawn Stall RN, BSN Entered By: Shawn Stall on 08/11/2022 10:10:16 Gail Brewer ([id number]) [id number] Page 2 of 10 -------------------------------------------------------------------------------- HPI Details Patient Name: Date of Service: Gail MPBELL, DA RA Brewer. 08/11/2022 9:15 A M Medical Record Number: [id number] Patient Account Number: [id number] Date of Birth/Sex: Treating RN: 13-May-1976 (47 y.o. F) Primary Care Provider: Leilani Brewer Other Clinician: Referring Provider: Treating Provider/Extender: Barnett Abu Weeks in Treatment: 0 History of Present Illness HPI Description: 08/11/2022 [name] is a 47 year old female with a past medical history of breast reduction that presents the clinic for left breast wound. She had breast reduction with Dr. Leta Baptist on 07/11/2022. She subsequently developed left nipple areolar complex necrosis and partial dehiscence of the left breast incisions. She had debridement in the OR for this issue on 07/22/2022. It is advised that she have another debridement on 1/16 however patient canceled this surgery. Per EMR patient had a wound culture and was treated with Bactrim. She states she is still taking this course. She currently denies signs of infection including increased warmth, erythema or purulent drainage from the wound or periwound. She states she does not want to follow with Dr. Leta Baptist and would like to be referred to [facility] plastic surgery. She has been using saline wet-to-dry dressings. Electronic Signature(s) Signed: 08/11/2022 12:02:25 PM By: Geralyn Corwin DO Entered By: Geralyn Corwin on 08/11/2022 10:15:10 -------------------------------------------------------------------------------- Physical Exam Details Patient Name: Date of Service: Gail MPBELL, DA RA Brewer. 08/11/2022 9:15 A M Medical Record  Number: [id number] Patient Account Number: [id number] Date of Birth/Sex: Treating RN: 1975-10-22 (47 y.o. F) Primary Care Provider: Leilani Brewer Other Clinician: Referring Provider: Treating Provider/Extender: Barnett Abu Weeks in Treatment: 0 Constitutional respirations regular, non-labored and within target range for patient.Marland Kitchen Psychiatric pleasant and cooperative. Notes Wound to the left breast with surgical dehiscence. Fat noted along with nonviable tissue and granulation tissue. No increased warmth, erythema or purulent drainage. A few sutures still in place. Electronic Signature(s) Signed: 08/11/2022 12:02:25  PM By: Kalman Shan DO Entered By: Kalman Shan on 08/11/2022 10:16:00 -------------------------------------------------------------------------------- Physician Orders Details Patient Name: Date of Service: Gail MPBELL, DA RA Brewer. 08/11/2022 9:15 A M Medical Record Number: [id number] Patient Account Number: [id number] Date of Birth/Sex: Treating RN: Sep 12, 1975 (47 y.o. Gail Brewer [place], [place] ([id number]) [phone].pdf Page 3 of 10 Primary Care Provider: Lin Landsman Other Clinician: Referring Provider: Treating Provider/Extender: Lucrezia Europe Weeks in Treatment: 0 Verbal / Phone Orders: No Diagnosis Coding ICD-10 Coding Code Description [id number] Unspecified open wound of left breast, initial encounter T81.31XA Disruption of external operation (surgical) wound, not elsewhere classified, initial encounter N64.1 Fat necrosis of breast Follow-up Appointments Other: - [facility] should call you to schedule an appt. If you do not hear from them in one week. Call them at [phone] to see if they received the referral if not call wound center. Will place an order through [facility] for vashe and wound care supplies. Anesthetic (In clinic) Topical Lidocaine 5% applied to wound  bed (In clinic) Topical Lidocaine 4% applied to wound bed Bathing/ Shower/ Hygiene Do not shower or bathe in tub. Wound Treatment Wound #1 - Breast Wound Laterality: Left Cleanser: Vashe 5.8 (oz) (DME) (Generic) 1 x Per Day/30 Days Discharge Instructions: Cleanse the wound with Vashe prior to applying a clean dressing using gauze sponges, not tissue or cotton balls. Prim Dressing: vashe 1 x Per Day/30 Days ary Discharge Instructions: soak gauze with Vashe- lightly pack into wound bed. Secondary Dressing: ABD Pad, 8x10 (DME) (Generic) 1 x Per Day/30 Days Discharge Instructions: Apply over primary dressing as directed. Secured With: Child psychotherapist, Sterile 2x75 (in/in) (DME) (Generic) 1 x Per Day/30 Days Discharge Instructions: Secure with stretch gauze as directed. Secured With: 42M Medipore H Soft Cloth Surgical T ape, 4 x 10 (in/yd) (DME) (Generic) 1 x Per Day/30 Days Discharge Instructions: Secure with tape as directed. Consults Plastic Surgery - Refer to [facility] Dr. Eusebio Friendly office any partner related to dehiscence of left breast reduction. Patient wants another opinion. - (ICD10 T81.31XA - Disruption of external operation (surgical) wound, not elsewhere classified, initial encounter) Patient Medications llergies: Flagyl A Notifications Medication Indication Start End for debridements in 08/11/2022 lidocaine clinic only DOSE topical 5 % cream - cream topical once daily Electronic Signature(s) Signed: 08/11/2022 12:02:25 PM By: Kalman Shan DO Entered By: Kalman Shan on 08/11/2022 10:16:19 Prescription 08/11/2022 -------------------------------------------------------------------------------- Brewer, Gail Brewer. Kalman Shan DO Patient Name: Provider: September 10, 1975 [id number] Date of Birth: NPI#: CORAH, WURZEL ([id number]) [id number] Page 4 of Dillard Cannon [id number] Sex: DEA #: [phone] Phone #:  License #: [place] Patient Address: [address], [place] [address] [place], [place] [address] [phone] Allergies Flagyl Provider's Orders Plastic Surgery - ICD10: T81.31XA - Refer to [facility] Dr. Eusebio Friendly office any partner related to dehiscence of left breast reduction. Patient wants another opinion. Hand Signature: Date(s): Electronic Signature(s) Signed: 08/11/2022 12:02:25 PM By: Kalman Shan DO Entered By: Kalman Shan on 08/11/2022 10:16:19 -------------------------------------------------------------------------------- Problem List Details Patient Name: Date of Service: Gail MPBELL, DA RA Brewer. 08/11/2022 9:15 A M Medical Record Number: [id number] Patient Account Number: [id number] Date of Birth/Sex: Treating RN: 02/07/1976 (47 y.o. F) Primary Care Provider: Lin Landsman Other Clinician: Referring Provider: Treating Provider/Extender: Lucrezia Europe Weeks in Treatment: 0 Active Problems ICD-10 Encounter Code Description Active Date MDM Diagnosis S21.002A Unspecified open wound of left  breast, initial encounter 08/11/2022 No Yes T81.31XA Disruption of external operation (surgical) wound, not elsewhere classified, 08/11/2022 No Yes initial encounter N64.1 Fat necrosis of breast 08/11/2022 No Yes Inactive Problems Resolved Problems Electronic Signature(s) Signed: 08/11/2022 12:02:25 PM By: Kalman Shan DO Entered By: Kalman Shan on 08/11/2022 09:48:58 Brewer, Gail Brewer ([id number] Page 5 of 10 -------------------------------------------------------------------------------- Progress Note Details Patient Name: Date of Service: Gail MPBELL, DA RA Brewer. 08/11/2022 9:15 A M Medical Record Number: [id number] Patient Account Number: [id number] Date of Birth/Sex: Treating RN: September 18, 1975 (47 y.o. F) Primary Care Provider:  Lin Landsman Other Clinician: Referring Provider: Treating Provider/Extender: Lucrezia Europe Weeks in Treatment: 0 Subjective Chief Complaint Information obtained from Patient 08/11/2022; open wound to the left breast secondary to wound dehiscence status post breast reduction History of Present Illness (HPI) 08/11/2022 Ms. Zeltzin Kazemi is a 47 year old female with a past medical history of breast reduction that presents the clinic for left breast wound. She had breast reduction with Dr. Iran Planas on 07/11/2022. She subsequently developed left nipple areolar complex necrosis and partial dehiscence of the left breast incisions. She had debridement in the OR for this issue on 07/22/2022. It is advised that she have another debridement on 1/16 however patient canceled this surgery. Per EMR patient had a wound culture and was treated with Bactrim. She states she is still taking this course. She currently denies signs of infection including increased warmth, erythema or purulent drainage from the wound or periwound. She states she does not want to follow with Dr. Iran Planas and would like to be referred to [facility] plastic surgery. She has been using saline wet-to-dry dressings. Patient History Information obtained from Patient, Chart. Allergies Flagyl (Severity: Mild, Reaction: yeast infection) Family History Cancer - Paternal Grandparents,Father, Hypertension - Mother, Kidney Disease - Mother, No family history of Diabetes, Heart Disease, Hereditary Spherocytosis, Lung Disease, Seizures, Stroke, Thyroid Problems, Tuberculosis. Social History Former smoker, Marital Status - Single, Alcohol Use - Moderate - 1 a week, Drug Use - No History, Caffeine Use - Never. Medical History Eyes Denies history of Cataracts, Glaucoma, Optic Neuritis Ear/Nose/Mouth/Throat Denies history of Chronic sinus problems/congestion, Middle ear problems Hematologic/Lymphatic Patient has history of  Anemia Denies history of Hemophilia, Human Immunodeficiency Virus, Lymphedema, Sickle Cell Disease Respiratory Denies history of Aspiration, Asthma, Chronic Obstructive Pulmonary Disease (COPD), Pneumothorax, Sleep Apnea, Tuberculosis Cardiovascular Denies history of Angina, Arrhythmia, Congestive Heart Failure, Coronary Artery Disease, Deep Vein Thrombosis, Hypertension, Hypotension, Myocardial Infarction, Peripheral Arterial Disease, Peripheral Venous Disease, Phlebitis, Vasculitis Gastrointestinal Denies history of Cirrhosis , Colitis, Crohnoos, Hepatitis A, Hepatitis B, Hepatitis C Endocrine Denies history of Type I Diabetes, Type II Diabetes Genitourinary Denies history of End Stage Renal Disease Immunological Denies history of Lupus Erythematosus, Raynaudoos, Scleroderma Integumentary (Skin) Denies history of History of Burn Musculoskeletal Denies history of Gout, Rheumatoid Arthritis, Osteoarthritis, Osteomyelitis Neurologic Denies history of Dementia, Neuropathy, Quadriplegia, Paraplegia, Seizure Disorder Oncologic Denies history of Received Chemotherapy, Received Radiation Psychiatric Denies history of Anorexia/bulimia, Confinement Anxiety Hospitalization/Surgery History - 11/20/2021 abdominal abscess. - 07/24/2011 Csection. - 07/11/2022 breast reduction bilateral. Medical A Surgical History Notes nd Cardiovascular heart murmur Psychiatric anxiety depression Review of Systems (ROS) Constitutional Symptoms (General Health) Brewer, Gail Brewer ([id number] Page 6 of 10 Complains or has symptoms of Chills - at night, cold sweats. Eyes Complains or has symptoms of Glasses / Contacts. Denies complaints or symptoms of Dry Eyes, Vision Changes. Ear/Nose/Mouth/Throat Denies complaints or symptoms of Chronic sinus problems or rhinitis. Respiratory Denies complaints or symptoms  of Chronic or frequent coughs, Shortness of  Breath. Cardiovascular Denies complaints or symptoms of Chest pain. Gastrointestinal Denies complaints or symptoms of Frequent diarrhea, Nausea, Vomiting. Endocrine Denies complaints or symptoms of Heat/cold intolerance. Genitourinary Denies complaints or symptoms of Frequent urination. Integumentary (Skin) Complains or has symptoms of Wounds - left breast. Musculoskeletal Denies complaints or symptoms of Muscle Pain, Muscle Weakness. Neurologic Denies complaints or symptoms of Numbness/parasthesias. Psychiatric Denies complaints or symptoms of Claustrophobia. Objective Constitutional respirations regular, non-labored and within target range for patient.. Vitals Time Taken: 9:22 AM, Height: 65 in, Source: Stated, Weight: 230 lbs, Source: Stated, BMI: 38.3, Temperature: 98 F, Pulse: 92 bpm, Respiratory Rate: 17 breaths/min, Blood Pressure: 118/75 mmHg. Psychiatric pleasant and cooperative. General Notes: Wound to the left breast with surgical dehiscence. Fat noted along with nonviable tissue and granulation tissue. No increased warmth, erythema or purulent drainage. A few sutures still in place. Integumentary (Hair, Skin) Wound #1 status is Open. Original cause of wound was Surgical Injury. The date acquired was: 07/15/2022. The wound is located on the Left Breast. The wound measures 10cm length x 13cm width x 3.3cm depth; 102.102cm^2 area and 336.936cm^3 volume. There is Fat Layer (Subcutaneous Tissue) exposed. There is no tunneling or undermining noted. There is a medium amount of serosanguineous drainage noted. The wound margin is distinct with the outline attached to the wound base. There is large (67-100%) red, pink granulation within the wound bed. There is a small (1-33%) amount of necrotic tissue within the wound bed including Adherent Slough. The periwound skin appearance did not exhibit: Callus, Crepitus, Excoriation, Induration, Rash, Scarring, Dry/Scaly,  Maceration, Atrophie Blanche, Cyanosis, Ecchymosis, Hemosiderin Staining, Mottled, Pallor, Rubor, Erythema. General Notes: 10 sutures noted. Assessment Active Problems ICD-10 Unspecified open wound of left breast, initial encounter Disruption of external operation (surgical) wound, not elsewhere classified, initial encounter Fat necrosis of breast Patient presents with an open wound to the left breast status post breast reduction performed by Dr. Iran Planas on 07/11/2022. This wound subsequently dehisced and she had necrosis of her nipple. She had an OR debridement on 07/22/2022 With excision of the left nipple. She is currently been doing wet-to-dry dressings with normal saline. She is on Bactrim currently for results of a wound culture. I cannot see the results. It was recommended that she have a subsequent OR debridement. This was scheduled for 1/16 however patient canceled. She would like a second opinion. At this time we discussed that she would best be treated by a plastic surgeon. She does not want to continue to follow with Dr. Iran Planas. She asked to be referred to [facility] plastic surgery. We went ahead and did this today. I also recommended doing Vashe wet-to-dry dressings. There were no signs of infection today and she is on Bactrim. She may follow-up as needed. Procedures Wound #1 Pre-procedure diagnosis of Wound #1 is an Open Surgical Wound located on the Left Breast . There was a Chemical/Enzymatic/Mechanical debridement Brewer, Gail Brewer ([id number]) [id number] Page 7 of 10 performed by Kalman Shan, DO.. Other agent used was gauze and wound cleanser. There was no bleeding. The procedure was tolerated well. Post Debridement Measurements: 10cm length x 13cm width x 3.3cm depth; 336.936cm^3 volume. Character of Wound/Ulcer Post Debridement is stable. Post procedure Diagnosis Wound #1: Same as Pre-Procedure Plan Follow-up Appointments: Other: - [facility] should call you to schedule an appt. If you do not hear from them in one week. Call them at [phone] to see if they received the  referral if not call wound center. Will place an order through [facility] for vashe and wound care supplies. Anesthetic: (In clinic) Topical Lidocaine 5% applied to wound bed (In clinic) Topical Lidocaine 4% applied to wound bed Bathing/ Shower/ Hygiene: Do not shower or bathe in tub. Consults ordered were: Plastic Surgery - Refer to [facility] Dr. Eusebio Friendly office any partner related to dehiscence of left breast reduction. Patient wants another opinion. The following medication(s) was prescribed: lidocaine topical 5 % cream cream topical once daily for for debridements in clinic only was prescribed at facility WOUND #1: - Breast Wound Laterality: Left Cleanser: Vashe 5.8 (oz) (DME) (Generic) 1 x Per Day/30 Days Discharge Instructions: Cleanse the wound with Vashe prior to applying a clean dressing using gauze sponges, not tissue or cotton balls. Prim Dressing: vashe 1 x Per Day/30 Days ary Discharge Instructions: soak gauze with Vashe- lightly pack into wound bed. Secondary Dressing: ABD Pad, 8x10 (DME) (Generic) 1 x Per Day/30 Days Discharge Instructions: Apply over primary dressing as directed. Secured With: Child psychotherapist, Sterile 2x75 (in/in) (DME) (Generic) 1 x Per Day/30 Days Discharge Instructions: Secure with stretch gauze as directed. Secured With: 60M Medipore H Soft Cloth Surgical T ape, 4 x 10 (in/yd) (DME) (Generic) 1 x Per Day/30 Days Discharge Instructions: Secure with tape as directed. 1. Vashe wet-to-dry dressings 2. Referral to plastic surgery 3. Follow-up as needed Electronic Signature(s) Signed: 08/11/2022 12:02:25 PM By: Kalman Shan DO Entered By: Kalman Shan on 08/11/2022 10:29:29 -------------------------------------------------------------------------------- HxROS Details Patient  Name: Date of Service: Gail MPBELL, DA RA Brewer. 08/11/2022 9:15 A M Medical Record Number: [id number] Patient Account Number: [id number] Date of Birth/Sex: Treating RN: 01/26/1976 (47 y.o. Gail Brewer Primary Care Provider: Lin Landsman Other Clinician: Referring Provider: Treating Provider/Extender: Lucrezia Europe Weeks in Treatment: 0 Information Obtained From Patient Chart Constitutional Symptoms (General Health) Complaints and Symptoms: Positive for: Chills - at night, cold sweats Eyes Complaints and Symptoms: Positive for: Glasses / Contacts Negative for: Dry Eyes; Vision Changes Medical History: Negative for: Cataracts; Glaucoma; Optic Neuritis Brewer, Gail Brewer ([id number]) [phone].pdf Page 8 of 10 Ear/Nose/Mouth/Throat Complaints and Symptoms: Negative for: Chronic sinus problems or rhinitis Medical History: Negative for: Chronic sinus problems/congestion; Middle ear problems Respiratory Complaints and Symptoms: Negative for: Chronic or frequent coughs; Shortness of Breath Medical History: Negative for: Aspiration; Asthma; Chronic Obstructive Pulmonary Disease (COPD); Pneumothorax; Sleep Apnea; Tuberculosis Cardiovascular Complaints and Symptoms: Negative for: Chest pain Medical History: Negative for: Angina; Arrhythmia; Congestive Heart Failure; Coronary Artery Disease; Deep Vein Thrombosis; Hypertension; Hypotension; Myocardial Infarction; Peripheral Arterial Disease; Peripheral Venous Disease; Phlebitis; Vasculitis Past Medical History Notes: heart murmur Gastrointestinal Complaints and Symptoms: Negative for: Frequent diarrhea; Nausea; Vomiting Medical History: Negative for: Cirrhosis ; Colitis; Crohns; Hepatitis A; Hepatitis B; Hepatitis C Endocrine Complaints and Symptoms: Negative for: Heat/cold intolerance Medical History: Negative for: Type I Diabetes; Type II Diabetes Genitourinary Complaints and  Symptoms: Negative for: Frequent urination Medical History: Negative for: End Stage Renal Disease Integumentary (Skin) Complaints and Symptoms: Positive for: Wounds - left breast Medical History: Negative for: History of Burn Musculoskeletal Complaints and Symptoms: Negative for: Muscle Pain; Muscle Weakness Medical History: Negative for: Gout; Rheumatoid Arthritis; Osteoarthritis; Osteomyelitis Neurologic Complaints and Symptoms: Negative for: Numbness/parasthesias Medical History: Negative for: Dementia; Neuropathy; Quadriplegia; Paraplegia; Seizure Disorder Psychiatric Complaints and Symptoms: Negative for: Claustrophobia Medical History: Negative for: Anorexia/bulimia; Confinement Anxiety Past Medical History Notes: anxiety Brewer, Gail Brewer ([id number] Page 9 of 10 depression Hematologic/Lymphatic  Medical History: Positive for: Anemia Negative for: Hemophilia; Human Immunodeficiency Virus; Lymphedema; Sickle Cell Disease Immunological Medical History: Negative for: Lupus Erythematosus; Raynauds; Scleroderma Oncologic Medical History: Negative for: Received Chemotherapy; Received Radiation Immunizations Pneumococcal Vaccine: Received Pneumococcal Vaccination: No Implantable Devices None Hospitalization / Surgery History Type of Hospitalization/Surgery 11/20/2021 abdominal abscess 07/24/2011 Csection 07/11/2022 breast reduction bilateral Family and Social History Cancer: Yes - Paternal Grandparents,Father; Diabetes: No; Heart Disease: No; Hereditary Spherocytosis: No; Hypertension: Yes - Mother; Kidney Disease: Yes - Mother; Lung Disease: No; Seizures: No; Stroke: No; Thyroid Problems: No; Tuberculosis: No; Former smoker; Marital Status - Single; Alcohol Use: Moderate - 1 a week; Drug Use: No History; Caffeine Use: Never; Financial Concerns: No; Food, Clothing or Shelter Needs: No; Support System Lacking: No; Transportation  Concerns: No Electronic Signature(s) Signed: 08/11/2022 12:02:25 PM By: Kalman Shan DO Signed: 08/11/2022 4:49:41 PM By: Erenest Blank Signed: 08/12/2022 6:38:55 PM By: Deon Pilling RN, BSN Entered By: Erenest Blank on 08/11/2022 09:32:18 -------------------------------------------------------------------------------- SuperBill Details Patient Name: Date of Service: Gail MPBELL, DA RA Brewer. 08/11/2022 Medical Record Number: [id number] Patient Account Number: [id number] Date of Birth/Sex: Treating RN: [birth year] (47 y.o. F) Primary Care Provider: Lin Landsman Other Clinician: Referring Provider: Treating Provider/Extender: Lucrezia Europe Weeks in Treatment: 0 Diagnosis Coding ICD-10 Codes Code Description F74.944H Unspecified open wound of left breast, initial encounter T81.31XA Disruption of external operation (surgical) wound, not elsewhere classified, initial encounter N64.1 Fat necrosis of breast Facility Procedures Physician Procedures : CPT4 Code Description Modifier [id number] - WC PHYS LEVEL 4 - NEW PT ICD-10 Diagnosis Description S21.002A Unspecified open wound of left breast, initial encounter T81.31XA Disruption of external operation (surgical) wound, not elsewhere classified,  initial encounter N64.1 Fat necrosis of breast Quantity: 1 Electronic Signature(s) Signed: 08/11/2022 12:02:25 PM By: Kalman Shan DO Signed: 08/12/2022 6:38:55 PM By: Deon Pilling RN, BSN Entered By: Deon Pilling on 08/11/2022 10:35:53

## 2022-08-13 NOTE — Progress Notes (Addendum)
Shrout, Amparo Brewer ([id number]) [id number] Page 1 of 7 Visit Report for 08/11/2022 Allergy List Details Patient Name: Date of Service: Gail Brewer, Gail Brewer. 08/11/2022 9:15 A M Medical Record Number: [id number] Patient Account Number: [id number] Date of Birth/Sex: Treating RN: [birth year] (47 y.o. Debby Bud Primary Care Tnya Ades: Lin Landsman Other Clinician: Referring Arinze Rivadeneira: Treating Telly Jawad/Extender: Lucrezia Europe Weeks in Treatment: 0 Allergies Active Allergies Flagyl Reaction: yeast infection Severity: Mild Allergy Notes Electronic Signature(s) Signed: 08/11/2022 4:49:41 PM By: Erenest Blank Entered By: Erenest Blank on 08/11/2022 09:25:57 -------------------------------------------------------------------------------- Arrival Information Details Patient Name: Date of Service: Gail Brewer, Gail Brewer. 08/11/2022 9:15 A M Medical Record Number: [id number] Patient Account Number: [id number] Date of Birth/Sex: Treating RN: 03-Oct-1975 (47 y.o. F) Primary Care Shuntay Everetts: Lin Landsman Other Clinician: Referring Tammala Weider: Treating Donnald Tabar/Extender: Mickel Baas in Treatment: 0 Visit Information Patient Arrived: Ambulatory Arrival Time: 09:18 Accompanied By: self Transfer Assistance: None Patient Identification Verified: Yes Secondary Verification Process Completed: Yes Patient Requires Transmission-Based Precautions: No Patient Has Alerts: No Electronic Signature(s) Signed: 08/11/2022 4:49:41 PM By: Erenest Blank Entered By: Erenest Blank on 08/11/2022 09:21:58 Clinic Level of Care Assessment Details -------------------------------------------------------------------------------- Gail Brewer ([id number]) [id number] Page 2 of 7 Patient Name: Date of Service: Gail Brewer, Gail Brewer. 08/11/2022 9:15 A M Medical Record Number: [id number] Patient Account Number:  [id number] Date of Birth/Sex: Treating RN: 11/18/1975 (47 y.o. Debby Bud Primary Care Amatullah Christy: Lin Landsman Other Clinician: Referring Alaisa Moffitt: Treating Kacen Mellinger/Extender: Lucrezia Europe Weeks in Treatment: 0 Clinic Level of Care Assessment Items TOOL 1 Quantity Score X- 1 0 Use when EandM and Procedure is performed on INITIAL visit ASSESSMENTS - Nursing Assessment / Reassessment X- 1 20 General Physical Exam (combine w/ comprehensive assessment (listed just below) when performed on new pt. evals) X- 1 25 Comprehensive Assessment (HX, ROS, Risk Assessments, Wounds Hx, etc.) ASSESSMENTS - Wound and Skin Assessment / Reassessment X- 1 10 Dermatologic / Skin Assessment (not related to wound area) ASSESSMENTS - Ostomy and/or Continence Assessment and Care []  - 0 Incontinence Assessment and Management []  - 0 Ostomy Care Assessment and Management (repouching, etc.) PROCESS - Coordination of Care X - Simple Patient / Family Education for ongoing care 1 15 []  - 0 Complex (extensive) Patient / Family Education for ongoing care X- 1 10 Staff obtains Programmer, systems, Records, T Results / Process Orders est []  - 0 Staff telephones HHA, Nursing Homes / Clarify orders / etc []  - 0 Routine Transfer to another Facility (non-emergent condition) []  - 0 Routine Hospital Admission (non-emergent condition) X- 1 15 New Admissions / Biomedical engineer / Ordering NPWT Apligraf, etc. , []  - 0 Emergency Hospital Admission (emergent condition) PROCESS - Special Needs []  - 0 Pediatric / Minor Patient Management []  - 0 Isolation Patient Management []  - 0 Hearing / Language / Visual special needs []  - 0 Assessment of Community assistance (transportation, D/C planning, etc.) []  - 0 Additional assistance / Altered mentation []  - 0 Support Surface(s) Assessment (bed, cushion, seat, etc.) INTERVENTIONS - Miscellaneous []  - 0 External ear exam []  - 0 Patient  Transfer (multiple staff / Civil Service fast streamer / Similar devices) []  - 0 Simple Staple / Suture removal (25 or less) []  - 0 Complex Staple / Suture removal (26 or more) []  - 0 Hypo/Hyperglycemic Management (do not check if billed separately) []  - 0 Ankle / Brachial Index (ABI) - do not check if billed separately Has the patient been seen at  the hospital within the last three years: Yes Total Score: 95 Level Of Care: New/Established - Level 3 Electronic Signature(s) Signed: 08/12/2022 6:38:55 PM By: Shawn Stall RN, BSN Entered By: Shawn Stall on 08/11/2022 10:35:45 Gail Brewer, Gail Brewer ([id number]) [id number] Page 3 of 7 -------------------------------------------------------------------------------- Encounter Discharge Information Details Patient Name: Date of Service: Gail Brewer, Gail Brewer. 08/11/2022 9:15 A M Medical Record Number: [id number] Patient Account Number: [id number] Date of Birth/Sex: Treating RN: Apr 28, 1976 (47 y.o. Arta Silence Primary Care Nattalie Santiesteban: Leilani Able Other Clinician: Referring Deanthony Maull: Treating Jerard Bays/Extender: Barnett Abu Weeks in Treatment: 0 Encounter Discharge Information Items Post Procedure Vitals Discharge Condition: Stable Temperature (F): 98 Ambulatory Status: Ambulatory Pulse (bpm): 92 Discharge Destination: Home Respiratory Rate (breaths/min): 17 Transportation: Private Auto Blood Pressure (mmHg): 118/75 Accompanied By: self Schedule Follow-up Appointment: Yes Clinical Summary of Care: Electronic Signature(s) Signed: 08/12/2022 6:38:55 PM By: Shawn Stall RN, BSN Entered By: Shawn Stall on 08/11/2022 10:12:58 -------------------------------------------------------------------------------- Lower Extremity Assessment Details Patient Name: Date of Service: Gail Brewer, Gail Brewer. 08/11/2022 9:15 A M Medical Record Number: [id number] Patient Account Number: [id number] Date of Birth/Sex: Treating  RN: 1975-09-05 (47 y.o. F) Primary Care Karan Inclan: Leilani Able Other Clinician: Referring Daren Doswell: Treating Jaxxon Naeem/Extender: Barnett Abu Weeks in Treatment: 0 Electronic Signature(s) Signed: 08/11/2022 4:49:41 PM By: Thayer Dallas Entered By: Thayer Dallas on 08/11/2022 09:36:00 -------------------------------------------------------------------------------- Multi Wound Chart Details Patient Name: Date of Service: Gail Brewer, Gail Brewer. 08/11/2022 9:15 A M Medical Record Number: [id number] Patient Account Number: [id number] Date of Birth/Sex: Treating RN: 02-Feb-1976 (47 y.o. F) Primary Care Senetra Dillin: Leilani Able Other Clinician: Referring Ryoma Nofziger: Treating Cletis Muma/Extender: Barnett Abu Weeks in Treatment: 0 Vital Signs Height(in): 65 Pulse(bpm): 92 Weight(lbs): 230 Blood Pressure(mmHg): 118/75 Body Mass Index(BMI): 38.3 Temperature(F): 98 Respiratory Rate(breaths/min): 17 Gail Brewer, Gail Brewer ([id number]) [id number] Page 4 of 7 [Treatment Notes:Wound Assessments Treatment Notes] Electronic Signature(s) Signed: 08/11/2022 12:02:25 PM By: Geralyn Corwin DO Entered By: Geralyn Corwin on 08/11/2022 09:49:07 -------------------------------------------------------------------------------- Multi-Disciplinary Care Plan Details Patient Name: Date of Service: Gail Brewer, Gail Brewer. 08/11/2022 9:15 A M Medical Record Number: [id number] Patient Account Number: [id number] Date of Birth/Sex: Treating RN: [birth year] (47 y.o. Arta Silence Primary Care Irisha Grandmaison: Leilani Able Other Clinician: Referring Aalia Greulich: Treating Nari Vannatter/Extender: Barnett Abu Weeks in Treatment: 0 Active Inactive Electronic Signature(s) Signed: 08/17/2022 9:32:05 AM By: Shawn Stall RN, BSN Previous Signature: 08/12/2022 6:38:55 PM Version By: Shawn Stall RN, BSN Entered By: Shawn Stall on 08/17/2022  09:32:05 -------------------------------------------------------------------------------- Pain Assessment Details Patient Name: Date of Service: Gail Brewer, Gail Brewer. 08/11/2022 9:15 A M Medical Record Number: [id number] Patient Account Number: [id number] Date of Birth/Sex: Treating RN: 1976/06/12 (47 y.o. Arta Silence Primary Care Coreen Shippee: Leilani Able Other Clinician: Referring Jeffery Bachmeier: Treating Twanna Resh/Extender: Barnett Abu Weeks in Treatment: 0 Active Problems Location of Pain Severity and Description of Pain Patient Has Paino Yes Site Locations Pain Location: Pain in Ulcers Rate the pain. Current Pain Level: 6 Gail Brewer, Gail Brewer ([id number]) [id number] Page 5 of 7 Pain Management and Medication Current Pain Management: Electronic Signature(s) Signed: 08/12/2022 6:38:55 PM By: Shawn Stall RN, BSN Entered By: Shawn Stall on 08/11/2022 09:56:12 -------------------------------------------------------------------------------- Patient/Caregiver Education Details Patient Name: Date of Service: Gail Brewer, Gail Brewer. 1/22/2024andnbsp9:15 A M Medical Record Number: [id number] Patient Account Number: [id number] Date of Birth/Gender: Treating RN: 1976/01/04 (47 y.o. Arta Silence Primary Care Physician: Leilani Able Other Clinician: Referring Physician: Treating Physician/Extender: Barnett Abu  Weeks in Treatment: 0 Education Assessment Education Provided To: Patient Education Topics Provided Wound/Skin Impairment: Handouts: Caring for Your Ulcer Methods: Explain/Verbal Responses: Reinforcements needed Electronic Signature(s) Signed: 08/12/2022 6:38:55 PM By: Deon Pilling RN, BSN Entered By: Deon Pilling on 08/11/2022 09:51:01 -------------------------------------------------------------------------------- Wound Assessment Details Patient Name: Date of Service: Gail Brewer, Gail Brewer. 08/11/2022 9:15  A M Medical Record Number: [id number] Patient Account Number: [id number] Date of Birth/Sex: Treating RN: 22-Mar-1976 (47 y.o. Debby Bud Primary Care Sharne Linders: Lin Landsman Other Clinician: Referring Aleem Elza: Treating Alexandr Oehler/Extender: Lucrezia Europe Weeks in Treatment: 0 Wound Status Wound Number: 1 Primary Etiology: Open Surgical Wound Wound Location: Left Breast Wound Status: Open Wounding Event: Surgical Injury Comorbid History: Anemia Date Acquired: 07/15/2022 Weeks Of Treatment: 0 Clustered Wound: No Photos Gail Brewer, Gail Brewer ([id number]) [id number] Page 6 of 7 Wound Measurements Length: (cm) 10 Width: (cm) 13 Depth: (cm) 3.3 Area: (cm) 102.102 Volume: (cm) 336.936 % Reduction in Area: % Reduction in Volume: Epithelialization: None Tunneling: No Undermining: No Wound Description Classification: Full Thickness Without Exposed Suppor Wound Margin: Distinct, outline attached Exudate Amount: Medium Exudate Type: Serosanguineous Exudate Color: red, brown t Structures Foul Odor After Cleansing: No Slough/Fibrino Yes Wound Bed Granulation Amount: Large (67-100%) Exposed Structure Granulation Quality: Red, Pink Fascia Exposed: No Necrotic Amount: Small (1-33%) Fat Layer (Subcutaneous Tissue) Exposed: Yes Necrotic Quality: Adherent Slough Tendon Exposed: No Muscle Exposed: No Joint Exposed: No Bone Exposed: No Periwound Skin Texture Texture Color No Abnormalities Noted: No No Abnormalities Noted: No Callus: No Atrophie Blanche: No Crepitus: No Cyanosis: No Excoriation: No Ecchymosis: No Induration: No Erythema: No Rash: No Hemosiderin Staining: No Scarring: No Mottled: No Pallor: No Moisture Rubor: No No Abnormalities Noted: No Dry / Scaly: No Maceration: No Assessment Notes 10 sutures noted. Electronic Signature(s) Signed: 08/12/2022 6:38:55 PM By: Deon Pilling RN, BSN Entered By: Deon Pilling on 08/11/2022 10:02:20 -------------------------------------------------------------------------------- Vitals Details Patient Name: Date of Service: Gail Brewer, Gail Brewer. 08/11/2022 9:15 A M Medical Record Number: [id number] Patient Account Number: [id number] Date of Birth/Sex: Treating RN: [birth year] (47 y.o. F) Primary Care Ceil Roderick: Lin Landsman Other Clinician: Referring Randie Tallarico: Treating Amia Rynders/Extender: Lucrezia Europe Weeks in Treatment: Gail Brewer, Gail Brewer ([id number]) [id number] Page 7 of 7 Vital Signs Time Taken: 09:22 Temperature (F): 98 Height (in): 65 Pulse (bpm): 92 Source: Stated Respiratory Rate (breaths/min): 17 Weight (lbs): 230 Blood Pressure (mmHg): 118/75 Source: Stated Reference Range: 80 - 120 mg / dl Body Mass Index (BMI): 38.3 Electronic Signature(s) Signed: 08/11/2022 4:49:41 PM By: Erenest Blank Entered By: Erenest Blank on 08/11/2022 09:25:29

## 2022-08-14 ENCOUNTER — Ambulatory Visit: Payer: No Typology Code available for payment source | Admitting: Plastic Surgery

## 2022-08-14 ENCOUNTER — Encounter: Payer: Self-pay | Admitting: Plastic Surgery

## 2022-08-14 ENCOUNTER — Observation Stay (HOSPITAL_COMMUNITY)
Admission: AD | Admit: 2022-08-14 | Discharge: 2022-08-15 | Disposition: A | Discharge status: Home / Self Care | Payer: Medicaid Other | Source: Other Acute Inpatient Hospital | Attending: Plastic Surgery | Admitting: Plastic Surgery

## 2022-08-14 ENCOUNTER — Ambulatory Visit (HOSPITAL_BASED_OUTPATIENT_CLINIC_OR_DEPARTMENT_OTHER): Payer: Medicaid Other | Admitting: Anesthesiology

## 2022-08-14 ENCOUNTER — Other Ambulatory Visit: Payer: Self-pay

## 2022-08-14 ENCOUNTER — Ambulatory Visit (INDEPENDENT_AMBULATORY_CARE_PROVIDER_SITE_OTHER): Payer: No Typology Code available for payment source | Admitting: Physician Assistant

## 2022-08-14 ENCOUNTER — Ambulatory Visit (HOSPITAL_COMMUNITY): Payer: Medicaid Other | Admitting: Anesthesiology

## 2022-08-14 ENCOUNTER — Encounter (HOSPITAL_COMMUNITY)
Admission: AD | Disposition: A | Discharge status: Home / Self Care | Payer: Self-pay | Source: Other Acute Inpatient Hospital | Attending: Plastic Surgery

## 2022-08-14 ENCOUNTER — Encounter (HOSPITAL_COMMUNITY): Payer: Self-pay | Admitting: Plastic Surgery

## 2022-08-14 VITALS — BP 101/65 | HR 85 | Temp 98.5°F | Resp 18 | Ht 65.0 in | Wt 230.0 lb

## 2022-08-14 DIAGNOSIS — Z87891 Personal history of nicotine dependence: Secondary | ICD-10-CM | POA: Insufficient documentation

## 2022-08-14 DIAGNOSIS — I96 Gangrene, not elsewhere classified: Secondary | ICD-10-CM | POA: Diagnosis not present

## 2022-08-14 DIAGNOSIS — T8131XA Disruption of external operation (surgical) wound, not elsewhere classified, initial encounter: Principal | ICD-10-CM | POA: Insufficient documentation

## 2022-08-14 DIAGNOSIS — S21002A Unspecified open wound of left breast, initial encounter: Secondary | ICD-10-CM | POA: Diagnosis not present

## 2022-08-14 DIAGNOSIS — Z9889 Other specified postprocedural states: Secondary | ICD-10-CM | POA: Insufficient documentation

## 2022-08-14 DIAGNOSIS — T8131XS Disruption of external operation (surgical) wound, not elsewhere classified, sequela: Secondary | ICD-10-CM

## 2022-08-14 DIAGNOSIS — Y828 Other medical devices associated with adverse incidents: Secondary | ICD-10-CM | POA: Diagnosis not present

## 2022-08-14 HISTORY — PX: APPLICATION OF WOUND VAC: SHX5189

## 2022-08-14 HISTORY — PX: IRRIGATION AND DEBRIDEMENT OF WOUND WITH SPLIT THICKNESS SKIN GRAFT: SHX5879

## 2022-08-14 LAB — POCT PREGNANCY, URINE: Preg Test, Ur: NEGATIVE

## 2022-08-14 SURGERY — IRRIGATION AND DEBRIDEMENT OF WOUND WITH SPLIT THICKNESS SKIN GRAFT
Anesthesia: General | Site: Breast | Laterality: Left

## 2022-08-14 MED ORDER — CHLORHEXIDINE GLUCONATE CLOTH 2 % EX PADS: 6.0000 | MEDICATED_PAD | Freq: Once | CUTANEOUS | Status: DC

## 2022-08-14 MED ORDER — SODIUM CHLORIDE 0.9 % IV SOLN
Freq: Once | INTRAVENOUS | Status: DC
  Filled 2022-08-14 (×2): qty 10

## 2022-08-14 MED ORDER — LACTATED RINGERS IV SOLN: INTRAVENOUS | Status: DC

## 2022-08-14 MED ORDER — OXYCODONE HCL 5 MG PO TABS: 5.0000 mg | ORAL_TABLET | Freq: Once | ORAL | Status: DC | PRN

## 2022-08-14 MED ORDER — MIDAZOLAM HCL 2 MG/2ML IJ SOLN
INTRAMUSCULAR | Status: AC
  Filled 2022-08-14: qty 2

## 2022-08-14 MED ORDER — DIPHENHYDRAMINE HCL 50 MG/ML IJ SOLN: 25.0000 mg | Freq: Four times a day (QID) | INTRAMUSCULAR | Status: DC | PRN

## 2022-08-14 MED ORDER — CEFAZOLIN SODIUM-DEXTROSE 2-4 GM/100ML-% IV SOLN
2.0000 g | INTRAVENOUS | Status: AC
  Administered 2022-08-14: 2 g via INTRAVENOUS
  Filled 2022-08-14: qty 100

## 2022-08-14 MED ORDER — DEXAMETHASONE SODIUM PHOSPHATE 10 MG/ML IJ SOLN
INTRAMUSCULAR | Status: DC | PRN
  Administered 2022-08-14: 5 mg via INTRAVENOUS

## 2022-08-14 MED ORDER — ONDANSETRON HCL 4 MG/2ML IJ SOLN
INTRAMUSCULAR | Status: AC
  Filled 2022-08-14: qty 2

## 2022-08-14 MED ORDER — ONDANSETRON HCL 4 MG/2ML IJ SOLN: 4.0000 mg | Freq: Four times a day (QID) | INTRAMUSCULAR | Status: DC | PRN

## 2022-08-14 MED ORDER — CHLORHEXIDINE GLUCONATE 0.12 % MT SOLN
15.0000 mL | OROMUCOSAL | Status: AC
  Filled 2022-08-14: qty 15

## 2022-08-14 MED ORDER — HYDROMORPHONE HCL 1 MG/ML IJ SOLN
0.2500 mg | INTRAMUSCULAR | Status: DC | PRN
  Administered 2022-08-14: 0.5 mg via INTRAVENOUS

## 2022-08-14 MED ORDER — ONDANSETRON HCL 4 MG/2ML IJ SOLN
INTRAMUSCULAR | Status: DC | PRN
  Administered 2022-08-14: 4 mg via INTRAVENOUS

## 2022-08-14 MED ORDER — DROPERIDOL 2.5 MG/ML IJ SOLN: 0.6250 mg | Freq: Once | INTRAMUSCULAR | Status: DC | PRN

## 2022-08-14 MED ORDER — MORPHINE SULFATE (PF) 4 MG/ML IV SOLN: 4.0000 mg | INTRAVENOUS | Status: DC | PRN

## 2022-08-14 MED ORDER — ONDANSETRON 4 MG PO TBDP: 4.0000 mg | ORAL_TABLET | Freq: Four times a day (QID) | ORAL | Status: DC | PRN

## 2022-08-14 MED ORDER — OXYCODONE-ACETAMINOPHEN 5-325 MG PO TABS
1.0000 | ORAL_TABLET | ORAL | Status: DC | PRN
  Administered 2022-08-14 – 2022-08-15 (×3): 2 via ORAL
  Filled 2022-08-14 (×3): qty 1
  Filled 2022-08-14 (×2): qty 2

## 2022-08-14 MED ORDER — ONDANSETRON HCL 4 MG/2ML IJ SOLN: 4.0000 mg | Freq: Once | INTRAMUSCULAR | Status: DC | PRN

## 2022-08-14 MED ORDER — CHLORHEXIDINE GLUCONATE 0.12 % MT SOLN
OROMUCOSAL | Status: AC
  Administered 2022-08-14: 15 mL via OROMUCOSAL
  Filled 2022-08-14: qty 15

## 2022-08-14 MED ORDER — DEXAMETHASONE SODIUM PHOSPHATE 10 MG/ML IJ SOLN
INTRAMUSCULAR | Status: AC
  Filled 2022-08-14: qty 1

## 2022-08-14 MED ORDER — OXYCODONE HCL 5 MG/5ML PO SOLN: 5.0000 mg | Freq: Once | ORAL | Status: DC | PRN

## 2022-08-14 MED ORDER — NAPROXEN 250 MG PO TABS: 500.0000 mg | ORAL_TABLET | Freq: Two times a day (BID) | ORAL | Status: DC | PRN

## 2022-08-14 MED ORDER — PROPOFOL 10 MG/ML IV BOLUS
INTRAVENOUS | Status: DC | PRN
  Administered 2022-08-14: 30 mg via INTRAVENOUS
  Administered 2022-08-14: 170 mg via INTRAVENOUS

## 2022-08-14 MED ORDER — MIDAZOLAM HCL 2 MG/2ML IJ SOLN
INTRAMUSCULAR | Status: DC | PRN
  Administered 2022-08-14: 2 mg via INTRAVENOUS

## 2022-08-14 MED ORDER — ACETAMINOPHEN 500 MG PO TABS
1000.0000 mg | ORAL_TABLET | Freq: Four times a day (QID) | ORAL | Status: DC
  Administered 2022-08-15 (×3): 1000 mg via ORAL
  Filled 2022-08-14 (×3): qty 2

## 2022-08-14 MED ORDER — FENTANYL CITRATE (PF) 250 MCG/5ML IJ SOLN
INTRAMUSCULAR | Status: DC | PRN
  Administered 2022-08-14: 100 ug via INTRAVENOUS
  Administered 2022-08-14: 50 ug via INTRAVENOUS
  Administered 2022-08-14: 100 ug via INTRAVENOUS

## 2022-08-14 MED ORDER — CEFAZOLIN SODIUM-DEXTROSE 2-3 GM-%(50ML) IV SOLR: INTRAVENOUS | Status: DC | PRN

## 2022-08-14 MED ORDER — FENTANYL CITRATE (PF) 250 MCG/5ML IJ SOLN
INTRAMUSCULAR | Status: AC
  Filled 2022-08-14: qty 5

## 2022-08-14 MED ORDER — FENTANYL CITRATE (PF) 100 MCG/2ML IJ SOLN: 50.0000 ug | Freq: Once | INTRAMUSCULAR | Status: AC

## 2022-08-14 MED ORDER — PROPOFOL 10 MG/ML IV BOLUS
INTRAVENOUS | Status: AC
  Filled 2022-08-14: qty 20

## 2022-08-14 MED ORDER — DIPHENHYDRAMINE HCL 25 MG PO CAPS: 25.0000 mg | ORAL_CAPSULE | Freq: Four times a day (QID) | ORAL | Status: DC | PRN

## 2022-08-14 MED ORDER — CEFAZOLIN SODIUM-DEXTROSE 2-4 GM/100ML-% IV SOLN
2.0000 g | Freq: Three times a day (TID) | INTRAVENOUS | Status: DC
  Administered 2022-08-15 (×2): 2 g via INTRAVENOUS
  Filled 2022-08-14 (×2): qty 100

## 2022-08-14 MED ORDER — HYDROMORPHONE HCL 1 MG/ML IJ SOLN
INTRAMUSCULAR | Status: AC
  Filled 2022-08-14: qty 1

## 2022-08-14 MED ORDER — 0.9 % SODIUM CHLORIDE (POUR BTL) OPTIME
TOPICAL | Status: DC | PRN
  Administered 2022-08-14 (×2): 1000 mL

## 2022-08-14 MED ORDER — FENTANYL CITRATE (PF) 100 MCG/2ML IJ SOLN
INTRAMUSCULAR | Status: AC
  Administered 2022-08-14: 50 ug via INTRAVENOUS
  Filled 2022-08-14: qty 2

## 2022-08-14 SURGICAL SUPPLY — 26 items
CANISTER SUCT 3000ML PPV (MISCELLANEOUS) ×1 IMPLANT
CANISTER WOUND CARE 500ML ATS (WOUND CARE) IMPLANT
CNTNR URN SCR LID CUP LEK RST (MISCELLANEOUS) IMPLANT
CONT SPEC 4OZ STRL OR WHT (MISCELLANEOUS) ×1
COVER SURGICAL LIGHT HANDLE (MISCELLANEOUS) ×1 IMPLANT
DRAPE IMP U-DRAPE 54X76 (DRAPES) ×1 IMPLANT
DRAPE INCISE 23X17 IOBAN STRL (DRAPES) ×1
DRAPE INCISE 23X17 STRL (DRAPES) IMPLANT
DRAPE INCISE IOBAN 23X17 STRL (DRAPES) ×1 IMPLANT
DRSG VAC ATS LRG SENSATRAC (GAUZE/BANDAGES/DRESSINGS) IMPLANT
ELECT REM PT RETURN 9FT ADLT (ELECTROSURGICAL) ×1
ELECTRODE REM PT RTRN 9FT ADLT (ELECTROSURGICAL) ×1 IMPLANT
GLOVE BIO SURGEON STRL SZ8 (GLOVE) ×1 IMPLANT
GLOVE BIOGEL PI IND STRL 8 (GLOVE) ×1 IMPLANT
GOWN STRL REUS W/ TWL LRG LVL3 (GOWN DISPOSABLE) ×3 IMPLANT
GOWN STRL REUS W/TWL LRG LVL3 (GOWN DISPOSABLE) ×2
GOWN STRL REUS W/TWL XL LVL3 (GOWN DISPOSABLE) ×1 IMPLANT
KIT BASIN OR (CUSTOM PROCEDURE TRAY) ×1 IMPLANT
KIT TURNOVER KIT B (KITS) ×1 IMPLANT
NS IRRIG 1000ML POUR BTL (IV SOLUTION) ×1 IMPLANT
PACK GENERAL/GYN (CUSTOM PROCEDURE TRAY) ×1 IMPLANT
PACK UNIVERSAL I (CUSTOM PROCEDURE TRAY) ×1 IMPLANT
PAD NEG PRESSURE SENSATRAC (MISCELLANEOUS) IMPLANT
STAPLER VISISTAT 35W (STAPLE) IMPLANT
TOWEL GREEN STERILE (TOWEL DISPOSABLE) ×1 IMPLANT
UNDERPAD 30X36 HEAVY ABSORB (UNDERPADS AND DIAPERS) ×1 IMPLANT

## 2022-08-14 NOTE — Progress Notes (Signed)
Patient ID: Gail Brewer, female    DOB: [birth year], 47 y.o.   MRN: [id number]  Chief Complaint  Patient presents with   Pre-op Exam      ICD-10-CM   1. Dehiscence of surgical wound, sequela  T81.31XS     2. S/P bilateral breast reduction  Z98.890        History of Present Illness: Gail Brewer is a 47 y.o.  female  with a history of macromastia s/p bilateral breast reduction performed 07/11/2022 by Dr. 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  She presents for preoperative evaluation for upcoming procedure, debridement and washout with placement of wound VAC, scheduled for 08/14/2022 with Dr.  Lovena Le .  She reports that recently she has required supplemental oxygen via [place] upon waking up from anesthesia.  Patient will discuss with anesthesiologist today.  Previous endometrioma excised without complications.  No history of keloiding.  She does not take any medications daily outside of vitamins.  NKDA.  No personal or family history of blood clots or clotting disorder.  No personal history of CVA, MI, or cancer.    Summary of Previous Visit: She was seen by Dr. Lovena Le earlier today after being sent here by the wound care clinic.  Dehisced wound with exposed breast parenchyma noted on exam.  Necrotic tissue on anterior surface of the exposed tissue.  Discussed debridement with wound VAC placement.  She understands that the wound VAC placement is not a one-time procedure and may require several additional operative interventions.  She voiced understanding and was agreeable to the plan.  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  She states that she may require additional FMLA.  Will discuss further at postoperative encounter.  PMH Significant for: Macromastia s/p bilateral breast reduction complicated by NAC necrosis and dehiscence, endometriosis.   Past Medical  History: Allergies: Allergies  Allergen Reactions   Flagyl [Metronidazole] Other (See Comments)    Yeast infection    Current Medications:  Current Outpatient Medications:    cephALEXin (KEFLEX) 250 MG capsule, Take 250 mg by mouth 4 (four) times daily. 7 day course., Disp: , Rfl:    ibuprofen (ADVIL) 200 MG tablet, Take 600 mg by mouth every 6 (six) hours as needed for headache, mild pain or moderate pain., Disp: , Rfl:    oxyCODONE (ROXICODONE) 5 MG immediate release tablet, Take 1 tablet (5 mg total) by mouth every 4 (four) hours as needed for up to 10 doses for severe pain. (Patient not taking: Reported on 08/14/2022), Disp: 10 tablet, Rfl: 0   oxyCODONE-acetaminophen (PERCOCET/ROXICET) 5-325 MG tablet, Take 1 tablet by mouth every 6 (six) hours as needed for severe pain., Disp: 20 tablet, Rfl: 0  Past Medical Problems: Past Medical History:  Diagnosis Date   Anemia    Anxiety    Depression    Headache(784.0)    Heart murmur    asymptomatic    Past Surgical History: Past Surgical History:  Procedure Laterality Date   BREAST IMPLANT REMOVAL Left 07/22/2022   Procedure: DEBRIDEMENT LEFT BREAST;  Surgeon: Irene Limbo, MD;  Location: [facility];  Service: Plastics;  Laterality: Left;   BREAST REDUCTION SURGERY Bilateral 07/11/2022   Procedure: MAMMARY REDUCTION  (BREAST);  Surgeon: Irene Limbo, MD;  Location: [facility];  Service: Plastics;  Laterality: Bilateral;   CESAREAN SECTION     x 2   CESAREAN SECTION  07/24/2011  Procedure: CESAREAN SECTION;  Surgeon: Avel Sensor, MD;  Location: [facility];  Service: Gynecology;  Laterality: N/A;  Repeat Cesarian Section   RESECTION OF ABDOMINAL MASS Right 11/20/2021   Procedure: EXCISION RIGHT LOWER ABDOMINAL WALL MASS;  Surgeon: Coralie Keens, MD;  Location: [facility];  Service: General;  Laterality: Right;    Social History: Social History   Socioeconomic History    Marital status: Divorced    Spouse name: Not on file   Number of children: Not on file   Years of education: Not on file   Highest education level: Not on file  Occupational History   Not on file  Tobacco Use   Smoking status: Former    Types: Cigars   Smokeless tobacco: Never  Vaping Use   Vaping Use: Never used  Substance and Sexual Activity   Alcohol use: Yes    Alcohol/week: 1.0 standard drink of alcohol    Types: 1 Standard drinks or equivalent per week   Drug use: No   Sexual activity: Not Currently    Birth control/protection: None  Other Topics Concern   Not on file  Social History Narrative   Not on file   Social Determinants of Health   Financial Resource Strain: Not on file  Food Insecurity: Not on file  Transportation Needs: Not on file  Physical Activity: Not on file  Stress: Not on file  Social Connections: Not on file  Intimate Partner Violence: Not on file    Family History: Family History  Problem Relation Age of Onset   Kidney failure Mother    Breast cancer Paternal Grandmother     Review of Systems: ROS Denies any recent chest pain, difficulty breathing, leg swelling.  Physical Exam: Vital Signs LMP 07/12/2022 Comment: UPT neg DOS  Physical Exam Constitutional:      General: Not in acute distress.    Appearance: Normal appearance. Not ill-appearing.  HENT:     Head: Normocephalic and atraumatic.  Eyes:     Pupils: Pupils are equal, round. Cardiovascular:     Rate and Rhythm: Normal rate.    Pulses: Normal pulses.  Pulmonary:     Effort: No respiratory distress or increased work of breathing.  Speaks in full sentences. Abdominal:     General: Abdomen is flat. No distension.   Musculoskeletal: Normal range of motion. \ Skin:    General: Skin is warm and dry.     Findings: No erythema or rash.  Neurological:     Mental Status: Alert and oriented to person, place, and time.  Psychiatric:        Mood and Affect: Mood normal.         Behavior: Behavior normal.    Assessment/Plan: The patient is scheduled for debridement and washout with placement of wound VAC with Dr.  Lovena Le .  Risks, benefits, and alternatives of procedure discussed, questions answered and consent obtained.    Smoking Status: Non-smoker. Last Mammogram: 07/2021; Results: BI-RADS Category 1: Negative.   Caprini Score: 4; Risk Factors include: Age, BMI greater than 25, and length of planned surgery. Recommendation for mechanical prophylaxis. Encourage early ambulation.   Pictures obtained: 08/07/2022.  Post-op Rx sent to pharmacy: Will prescribe Zofran and Percocet at time of discharge tomorrow.  Last filled narcotic was #10 Percocet 5-325 mg on 08/08/2022.  Patient was provided with the General Surgical Risk consent document and Pain Medication Agreement prior to their appointment.  They had adequate time to read through  the risk consent documents and Pain Medication Agreement. We also discussed them in person together during this preop appointment. All of their questions were answered to their satisfaction.  Recommended calling if they have any further questions.  Risk consent form and Pain Medication Agreement to be scanned into patient's chart.   Electronically signed by: Jermari Tamargo, PA-C 08/14/2022 11:24 AM 

## 2022-08-14 NOTE — Discharge Instructions (Signed)
Activity: Avoid strenuous activity.  No lifting, pushing, or pulling greater than 15 pounds.  Diet: No restrictions.  Try to optimize nutrition with plenty of proteins, fruits, and vegetables to improve healing.   Wound Care: Leave wound VAC in place. You can reinforce with the provided tape if you develop a leak. Sponge bathe only.    Follow-Up: Plan is to return to the OR Monday or Tuesday of next week for wound VAC change. You will be called by our office with clarification as to the date/time sometime today if you haven't already.    Things to watch for:  Call the office if you experience fever, chills, intractable vomiting, or significant bleeding.  Mild wound drainage is common after breast reduction surgery and should not be cause for alarm.  IF YOU NEED ANY ASSISTANCE WITH TROUBLESHOOTING THE WOUND VAC, PLEASE LET us KNOW BUT FIRST TRY TO RESOLVE WITH THE CUSTOMER SERVICE TEAM AT 19M. THE PHONE NUMBER SHOULD BE ON THE BACK OF THE MACHINE OR ON THE BOX. THESE WOUND VACS DO NEED TO BE CHARGED.

## 2022-08-14 NOTE — H&P (View-Only) (Signed)
Patient ID: Gail Brewer, female    DOB: [birth year], 47 y.o.   MRN: [id number]  Chief Complaint  Patient presents with   Pre-op Exam      ICD-10-CM   1. Dehiscence of surgical wound, sequela  T81.31XS     2. S/P bilateral breast reduction  Z98.890        History of Present Illness: Gail Brewer is a 47 y.o.  female  with a history of macromastia s/p bilateral breast reduction performed 07/11/2022 by Dr. 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  She presents for preoperative evaluation for upcoming procedure, debridement and washout with placement of wound VAC, scheduled for 08/14/2022 with Dr.  Lovena Le .  She reports that recently she has required supplemental oxygen via [place] upon waking up from anesthesia.  Patient will discuss with anesthesiologist today.  Previous endometrioma excised without complications.  No history of keloiding.  She does not take any medications daily outside of vitamins.  NKDA.  No personal or family history of blood clots or clotting disorder.  No personal history of CVA, MI, or cancer.    Summary of Previous Visit: She was seen by Dr. Lovena Le earlier today after being sent here by the wound care clinic.  Dehisced wound with exposed breast parenchyma noted on exam.  Necrotic tissue on anterior surface of the exposed tissue.  Discussed debridement with wound VAC placement.  She understands that the wound VAC placement is not a one-time procedure and may require several additional operative interventions.  She voiced understanding and was agreeable to the plan.  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  She states that she may require additional FMLA.  Will discuss further at postoperative encounter.  PMH Significant for: Macromastia s/p bilateral breast reduction complicated by NAC necrosis and dehiscence, endometriosis.   Past Medical  History: Allergies: Allergies  Allergen Reactions   Flagyl [Metronidazole] Other (See Comments)    Yeast infection    Current Medications:  Current Outpatient Medications:    cephALEXin (KEFLEX) 250 MG capsule, Take 250 mg by mouth 4 (four) times daily. 7 day course., Disp: , Rfl:    ibuprofen (ADVIL) 200 MG tablet, Take 600 mg by mouth every 6 (six) hours as needed for headache, mild pain or moderate pain., Disp: , Rfl:    oxyCODONE (ROXICODONE) 5 MG immediate release tablet, Take 1 tablet (5 mg total) by mouth every 4 (four) hours as needed for up to 10 doses for severe pain. (Patient not taking: Reported on 08/14/2022), Disp: 10 tablet, Rfl: 0   oxyCODONE-acetaminophen (PERCOCET/ROXICET) 5-325 MG tablet, Take 1 tablet by mouth every 6 (six) hours as needed for severe pain., Disp: 20 tablet, Rfl: 0  Past Medical Problems: Past Medical History:  Diagnosis Date   Anemia    Anxiety    Depression    Headache(784.0)    Heart murmur    asymptomatic    Past Surgical History: Past Surgical History:  Procedure Laterality Date   BREAST IMPLANT REMOVAL Left 07/22/2022   Procedure: DEBRIDEMENT LEFT BREAST;  Surgeon: Irene Limbo, MD;  Location: [facility];  Service: Plastics;  Laterality: Left;   BREAST REDUCTION SURGERY Bilateral 07/11/2022   Procedure: MAMMARY REDUCTION  (BREAST);  Surgeon: Irene Limbo, MD;  Location: [facility];  Service: Plastics;  Laterality: Bilateral;   CESAREAN SECTION     x 2   CESAREAN SECTION  07/24/2011  Procedure: CESAREAN SECTION;  Surgeon: Avel Sensor, MD;  Location: [facility];  Service: Gynecology;  Laterality: N/A;  Repeat Cesarian Section   RESECTION OF ABDOMINAL MASS Right 11/20/2021   Procedure: EXCISION RIGHT LOWER ABDOMINAL WALL MASS;  Surgeon: Coralie Keens, MD;  Location: [facility];  Service: General;  Laterality: Right;    Social History: Social History   Socioeconomic History    Marital status: Divorced    Spouse name: Not on file   Number of children: Not on file   Years of education: Not on file   Highest education level: Not on file  Occupational History   Not on file  Tobacco Use   Smoking status: Former    Types: Cigars   Smokeless tobacco: Never  Vaping Use   Vaping Use: Never used  Substance and Sexual Activity   Alcohol use: Yes    Alcohol/week: 1.0 standard drink of alcohol    Types: 1 Standard drinks or equivalent per week   Drug use: No   Sexual activity: Not Currently    Birth control/protection: None  Other Topics Concern   Not on file  Social History Narrative   Not on file   Social Determinants of Health   Financial Resource Strain: Not on file  Food Insecurity: Not on file  Transportation Needs: Not on file  Physical Activity: Not on file  Stress: Not on file  Social Connections: Not on file  Intimate Partner Violence: Not on file    Family History: Family History  Problem Relation Age of Onset   Kidney failure Mother    Breast cancer Paternal Grandmother     Review of Systems: ROS Denies any recent chest pain, difficulty breathing, leg swelling.  Physical Exam: Vital Signs LMP 07/12/2022 Comment: UPT neg DOS  Physical Exam Constitutional:      General: Not in acute distress.    Appearance: Normal appearance. Not ill-appearing.  HENT:     Head: Normocephalic and atraumatic.  Eyes:     Pupils: Pupils are equal, round. Cardiovascular:     Rate and Rhythm: Normal rate.    Pulses: Normal pulses.  Pulmonary:     Effort: No respiratory distress or increased work of breathing.  Speaks in full sentences. Abdominal:     General: Abdomen is flat. No distension.   Musculoskeletal: Normal range of motion. \ Skin:    General: Skin is warm and dry.     Findings: No erythema or rash.  Neurological:     Mental Status: Alert and oriented to person, place, and time.  Psychiatric:        Mood and Affect: Mood normal.         Behavior: Behavior normal.    Assessment/Plan: The patient is scheduled for debridement and washout with placement of wound VAC with Dr.  Lovena Le .  Risks, benefits, and alternatives of procedure discussed, questions answered and consent obtained.    Smoking Status: Non-smoker. Last Mammogram: 07/2021; Results: BI-RADS Category 1: Negative.   Caprini Score: 4; Risk Factors include: Age, BMI greater than 25, and length of planned surgery. Recommendation for mechanical prophylaxis. Encourage early ambulation.   Pictures obtained: 08/07/2022.  Post-op Rx sent to pharmacy: Will prescribe Zofran and Percocet at time of discharge tomorrow.  Last filled narcotic was #10 Percocet 5-325 mg on 08/08/2022.  Patient was provided with the General Surgical Risk consent document and Pain Medication Agreement prior to their appointment.  They had adequate time to read through  the risk consent documents and Pain Medication Agreement. We also discussed them in person together during this preop appointment. All of their questions were answered to their satisfaction.  Recommended calling if they have any further questions.  Risk consent form and Pain Medication Agreement to be scanned into patient's chart.   Electronically signed by: Evelena Leyden, PA-C 08/14/2022 11:24 AM

## 2022-08-14 NOTE — H&P (View-Only) (Signed)
Patient ID: Gail Brewer, female    DOB: [birth year], 47 y.o.   MRN: [id number]  Chief Complaint  Patient presents with   Pre-op Exam      ICD-10-CM   1. Dehiscence of surgical wound, sequela  T81.31XS     2. S/P bilateral breast reduction  Z98.890        History of Present Illness: Gail Brewer is a 47 y.o.  female  with a history of macromastia s/p bilateral breast reduction performed 07/11/2022 by Dr. 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  She presents for preoperative evaluation for upcoming procedure, debridement and washout with placement of wound VAC, scheduled for 08/14/2022 with Dr.  Lovena Le .  She reports that recently she has required supplemental oxygen via [place] upon waking up from anesthesia.  Patient will discuss with anesthesiologist today.  Previous endometrioma excised without complications.  No history of keloiding.  She does not take any medications daily outside of vitamins.  NKDA.  No personal or family history of blood clots or clotting disorder.  No personal history of CVA, MI, or cancer.    Summary of Previous Visit: She was seen by Dr. Lovena Le earlier today after being sent here by the wound care clinic.  Dehisced wound with exposed breast parenchyma noted on exam.  Necrotic tissue on anterior surface of the exposed tissue.  Discussed debridement with wound VAC placement.  She understands that the wound VAC placement is not a one-time procedure and may require several additional operative interventions.  She voiced understanding and was agreeable to the plan.  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  She states that she may require additional FMLA.  Will discuss further at postoperative encounter.  PMH Significant for: Macromastia s/p bilateral breast reduction complicated by NAC necrosis and dehiscence, endometriosis.   Past Medical  History: Allergies: Allergies  Allergen Reactions   Flagyl [Metronidazole] Other (See Comments)    Yeast infection    Current Medications:  Current Outpatient Medications:    cephALEXin (KEFLEX) 250 MG capsule, Take 250 mg by mouth 4 (four) times daily. 7 day course., Disp: , Rfl:    ibuprofen (ADVIL) 200 MG tablet, Take 600 mg by mouth every 6 (six) hours as needed for headache, mild pain or moderate pain., Disp: , Rfl:    oxyCODONE (ROXICODONE) 5 MG immediate release tablet, Take 1 tablet (5 mg total) by mouth every 4 (four) hours as needed for up to 10 doses for severe pain. (Patient not taking: Reported on 08/14/2022), Disp: 10 tablet, Rfl: 0   oxyCODONE-acetaminophen (PERCOCET/ROXICET) 5-325 MG tablet, Take 1 tablet by mouth every 6 (six) hours as needed for severe pain., Disp: 20 tablet, Rfl: 0  Past Medical Problems: Past Medical History:  Diagnosis Date   Anemia    Anxiety    Depression    Headache(784.0)    Heart murmur    asymptomatic    Past Surgical History: Past Surgical History:  Procedure Laterality Date   BREAST IMPLANT REMOVAL Left 07/22/2022   Procedure: DEBRIDEMENT LEFT BREAST;  Surgeon: Irene Limbo, MD;  Location: [facility];  Service: Plastics;  Laterality: Left;   BREAST REDUCTION SURGERY Bilateral 07/11/2022   Procedure: MAMMARY REDUCTION  (BREAST);  Surgeon: Irene Limbo, MD;  Location: [facility];  Service: Plastics;  Laterality: Bilateral;   CESAREAN SECTION     x 2   CESAREAN SECTION  07/24/2011  Procedure: CESAREAN SECTION;  Surgeon: Avel Sensor, MD;  Location: [facility];  Service: Gynecology;  Laterality: N/A;  Repeat Cesarian Section   RESECTION OF ABDOMINAL MASS Right 11/20/2021   Procedure: EXCISION RIGHT LOWER ABDOMINAL WALL MASS;  Surgeon: Coralie Keens, MD;  Location: [facility];  Service: General;  Laterality: Right;    Social History: Social History   Socioeconomic History    Marital status: Divorced    Spouse name: Not on file   Number of children: Not on file   Years of education: Not on file   Highest education level: Not on file  Occupational History   Not on file  Tobacco Use   Smoking status: Former    Types: Cigars   Smokeless tobacco: Never  Vaping Use   Vaping Use: Never used  Substance and Sexual Activity   Alcohol use: Yes    Alcohol/week: 1.0 standard drink of alcohol    Types: 1 Standard drinks or equivalent per week   Drug use: No   Sexual activity: Not Currently    Birth control/protection: None  Other Topics Concern   Not on file  Social History Narrative   Not on file   Social Determinants of Health   Financial Resource Strain: Not on file  Food Insecurity: Not on file  Transportation Needs: Not on file  Physical Activity: Not on file  Stress: Not on file  Social Connections: Not on file  Intimate Partner Violence: Not on file    Family History: Family History  Problem Relation Age of Onset   Kidney failure Mother    Breast cancer Paternal Grandmother     Review of Systems: ROS Denies any recent chest pain, difficulty breathing, leg swelling.  Physical Exam: Vital Signs LMP 07/12/2022 Comment: UPT neg DOS  Physical Exam Constitutional:      General: Not in acute distress.    Appearance: Normal appearance. Not ill-appearing.  HENT:     Head: Normocephalic and atraumatic.  Eyes:     Pupils: Pupils are equal, round. Cardiovascular:     Rate and Rhythm: Normal rate.    Pulses: Normal pulses.  Pulmonary:     Effort: No respiratory distress or increased work of breathing.  Speaks in full sentences. Abdominal:     General: Abdomen is flat. No distension.   Musculoskeletal: Normal range of motion. \ Skin:    General: Skin is warm and dry.     Findings: No erythema or rash.  Neurological:     Mental Status: Alert and oriented to person, place, and time.  Psychiatric:        Mood and Affect: Mood normal.         Behavior: Behavior normal.    Assessment/Plan: The patient is scheduled for debridement and washout with placement of wound VAC with Dr.  Lovena Le .  Risks, benefits, and alternatives of procedure discussed, questions answered and consent obtained.    Smoking Status: Non-smoker. Last Mammogram: 07/2021; Results: BI-RADS Category 1: Negative.   Caprini Score: 4; Risk Factors include: Age, BMI greater than 25, and length of planned surgery. Recommendation for mechanical prophylaxis. Encourage early ambulation.   Pictures obtained: 08/07/2022.  Post-op Rx sent to pharmacy: Will prescribe Zofran and Percocet at time of discharge tomorrow.  Last filled narcotic was #10 Percocet 5-325 mg on 08/08/2022.  Patient was provided with the General Surgical Risk consent document and Pain Medication Agreement prior to their appointment.  They had adequate time to read through  the risk consent documents and Pain Medication Agreement. We also discussed them in person together during this preop appointment. All of their questions were answered to their satisfaction.  Recommended calling if they have any further questions.  Risk consent form and Pain Medication Agreement to be scanned into patient's chart.   Electronically signed by: Zailen Albarran, PA-C 08/14/2022 11:24 AM 

## 2022-08-14 NOTE — Progress Notes (Signed)
Referring Provider Lin Landsman, [place],  [place] [id number]   CC:  Chief Complaint  Patient presents with   Consult      Gail Brewer is an 47 y.o. female.  HPI: Gail Brewer is a very pleasant 47 year old female who is referred for evaluation of her left breast.  The patient had a very unfortunate but recognized complication of a breast reduction in which she lost her left nipple due to ischemia and subsequently had a wound dehiscence of the vertical incision.  The breast reduction was done on December 22 and she has undergone debridement of the wound on January 2.  She was seen in the wound care center yesterday for evaluation of the wound and referred to our clinic for an urgent evaluation.  She is currently in mild pain but otherwise stable.  She is afebrile with a heart rate of 85 and no evidence of sepsis.  Allergies  Allergen Reactions   Flagyl [Metronidazole] Other (See Comments)    Yeast infection    Outpatient Encounter Medications as of 08/14/2022  Medication Sig Note   cephALEXin (KEFLEX) 250 MG capsule Take 250 mg by mouth 4 (four) times daily. 7 day course. 07/28/2022: Pt is on day 4.   ibuprofen (ADVIL) 200 MG tablet Take 600 mg by mouth every 6 (six) hours as needed for headache, mild pain or moderate pain.    oxyCODONE (ROXICODONE) 5 MG immediate release tablet Take 1 tablet (5 mg total) by mouth every 4 (four) hours as needed for up to 10 doses for severe pain. (Patient not taking: Reported on 08/14/2022)    oxyCODONE-acetaminophen (PERCOCET/ROXICET) 5-325 MG tablet Take 1 tablet by mouth every 6 (six) hours as needed for severe pain.    No facility-administered encounter medications on file as of 08/14/2022.     Past Medical History:  Diagnosis Date   Anemia    Anxiety    Depression    Headache(784.0)    Heart murmur    asymptomatic    Past Surgical History:  Procedure Laterality Date   BREAST IMPLANT REMOVAL Left 07/22/2022   Procedure:  DEBRIDEMENT LEFT BREAST;  Surgeon: Irene Limbo, MD;  Location: [facility];  Service: Plastics;  Laterality: Left;   BREAST REDUCTION SURGERY Bilateral 07/11/2022   Procedure: MAMMARY REDUCTION  (BREAST);  Surgeon: Irene Limbo, MD;  Location: [facility];  Service: Plastics;  Laterality: Bilateral;   CESAREAN SECTION     x 2   CESAREAN SECTION  07/24/2011   Procedure: CESAREAN SECTION;  Surgeon: Avel Sensor, MD;  Location: [facility];  Service: Gynecology;  Laterality: N/A;  Repeat Cesarian Section   RESECTION OF ABDOMINAL MASS Right 11/20/2021   Procedure: EXCISION RIGHT LOWER ABDOMINAL WALL MASS;  Surgeon: Coralie Keens, MD;  Location: [facility];  Service: General;  Laterality: Right;    Family History  Problem Relation Age of Onset   Kidney failure Mother    Breast cancer Paternal Grandmother     Social History   Social History Narrative   Not on file     Review of Systems General: Denies fevers, chills, weight loss CV: Denies chest pain, shortness of breath, palpitations Breasts: Status post bilateral breast reduction with wound dehiscence and nipple ischemia on the left.  Physical Exam    08/14/2022   10:29 AM 08/11/2022    2:44 PM 08/11/2022   10:47 AM  Vitals with BMI  Height 5\' 5"   Weight 230 lbs    BMI 76.19    Systolic 509 326 712  Diastolic 65 78 71  Pulse 85 87 86    General:  No acute distress,  Alert and oriented, Non-Toxic, Normal speech and affect Breast: The patient has a dehisced wound with exposed breast parenchyma.  There is necrotic tissue on the anterior surface of the exposed tissue. Mammogram: Mammography performed in January 2023 was BI-RADS 1  Assessment/Plan Left breast wound dehiscence: I had a long discussion with Gail Brewer regarding her issue.  She understands that while this is a known complication is certainly not 1 that any surgeon expects to have.  She still has sufficient  volume in the left breast to where she should have a very acceptable outcome once we have treated the current problem she understands that this will not be a one-time procedure to close her wound.  This will take prolonged wound care and possibly several operative interventions.  Today I will take her to the operating room where I will perform debridement and washout of the breast.  I also plan to place a wound VAC at that time.  Is in agreement with this plan.  Will schedule for this evening.  [name] 08/14/2022, 10:56 AM

## 2022-08-14 NOTE — Transfer of Care (Signed)
Immediate Anesthesia Transfer of Care Note  Patient: Gail Brewer  Procedure(s) Performed: Washout and placement of wound vac (Left: Breast) APPLICATION OF WOUND VAC (Left: Breast)  Patient Location: PACU  Anesthesia Type:General  Level of Consciousness: awake and alert   Airway & Oxygen Therapy: Patient Spontanous Breathing  Post-op Assessment: Report given to RN  Post vital signs: Reviewed  Last Vitals:  Vitals Value Taken Time  BP 104/64 08/14/22 2024  Temp 36.9 C 08/14/22 2024  Pulse 85 08/14/22 2024  Resp 15 08/14/22 2024  SpO2 95 % 08/14/22 2024    Last Pain:  Vitals:   08/14/22 2024  TempSrc: Oral  PainSc:       Patients Stated Pain Goal: 4 (35/67/01 4103)  Complications: No notable events documented.

## 2022-08-14 NOTE — Interval H&P Note (Signed)
History and Physical Interval Note: Pt met in the pre op holding area. No change in physical exam or indicatio for surgery. Surgical site marked with her concurrence. Will proceed with debridement and wound vac placement on the left breast at her request  08/14/2022 4:11 PM  Gail Brewer  has presented today for surgery, with the diagnosis of Open wound of left breast,.  The various methods of treatment have been discussed with the patient and family. After consideration of risks, benefits and other options for treatment, the patient has consented to  Procedure(s): Washout and placement of wound vac (Left) APPLICATION OF WOUND VAC (Left) as a surgical intervention.  The patient's history has been reviewed, patient examined, no change in status, stable for surgery.  I have reviewed the patient's chart and labs.  Questions were answered to the patient's satisfaction.     [name]

## 2022-08-14 NOTE — Op Note (Signed)
DATE OF OPERATION: 08/14/2022  LOCATION: [facility] Main operating Room  PREOPERATIVE DIAGNOSIS: Wound dehiscence of nipple necrosis left breast  POSTOPERATIVE DIAGNOSIS: Same  PROCEDURE: Wound debridement washout and placement of wound VAC  SURGEON: Jeanann Lewandowsky, MD  ASSISTANT: Krista Blue  EBL: 10 cc  CONDITION: Stable  COMPLICATIONS: None  Wound dimensions: 12 cm wide by 12 cm length by 5.5 cm deep  INDICATION: The patient, Gail Brewer, is a 47 y.o. female born on 09/10/1975, is here for treatment of the left breast.  Patient underwent a breast reduction December 22 and developed necrosis of the nipple and the pedicle.  She has undergone debridement in the past and has had wound care since that time.  She presented this morning to my office with continued necrosis on the distal portion of the pedicle.  She is taken to the operating room for additional debridement washout and placement of a wound VAC as part of the treatment for her wound dehiscence  PROCEDURE DETAILS:  The patient was seen prior to surgery and marked.   IV antibiotics were given. The patient was taken to the operating room and given a general anesthetic. A standard time out was performed and all information was confirmed by those in the room. SCDs were placed.   The obviously necrotic and ischemic tissue was debrided this was a portion of tissue approximately 5 x 5 x 5 cm.  A portion of tissue was taken and sent for wound culture.  There was a pocket at the distal portion of the superior medial pedicle that measured 5.5 cm in depth this was copiously irrigated with warm normal saline.  After I had removed all of the tissue that appeared to be nonviable a wound VAC was placed 1 sponge was placed between the distal portion of the pedicle and the lateral skin flap and a second sponge measuring 12 x 12 cm was placed over the entire exposed pedicle and breast tissue.  The wound VAC was placed to 125 mm of continuous suction. The  patient was allowed to wake up and taken to recovery room in stable condition at the end of the case. The family was notified at the end of the case.   The advanced practice practitioner (APP), Mr. Krista Blue, assisted throughout the case.  The APP was essential in retraction and counter traction when needed to make the case progress smoothly.  This retraction and assistance made it possible to see the tissue plans for the procedure.  The assistance was needed for blood control, tissue re-approximation and assisted with closure of the incision site.

## 2022-08-14 NOTE — Anesthesia Procedure Notes (Signed)
Procedure Name: LMA Insertion Date/Time: 08/14/2022 5:43 PM  Performed by: Barrington Ellison, CRNAPre-anesthesia Checklist: Patient identified, Emergency Drugs available, Suction available and Patient being monitored Patient Re-evaluated:Patient Re-evaluated prior to induction Oxygen Delivery Method: Circle System Utilized Preoxygenation: Pre-oxygenation with 100% oxygen Induction Type: IV induction Ventilation: Mask ventilation without difficulty LMA: LMA inserted LMA Size: 4.0 Number of attempts: 1 Placement Confirmation: positive ETCO2 Tube secured with: Tape Dental Injury: Teeth and Oropharynx as per pre-operative assessment

## 2022-08-14 NOTE — Anesthesia Preprocedure Evaluation (Addendum)
Anesthesia Evaluation  Patient identified by MRN, date of birth, ID band Patient awake    Reviewed: Allergy & Precautions, NPO status , Patient's Chart, lab work & pertinent test results  History of Anesthesia Complications Negative for: history of anesthetic complications  Airway Mallampati: II  TM Distance: >3 FB Neck ROM: Full    Dental no notable dental hx. (+) Dental Advisory Given, Teeth Intact   Pulmonary former smoker   Pulmonary exam normal breath sounds clear to auscultation       Cardiovascular negative cardio ROS Normal cardiovascular exam+ Valvular Problems/Murmurs (remote h/o murmur)  Rhythm:Regular Rate:Normal     Neuro/Psych  Headaches PSYCHIATRIC DISORDERS Anxiety Depression       GI/Hepatic negative GI ROS, Neg liver ROS,,,  Endo/Other    Morbid obesityObesity Open wound left breast S/P bilateral mammary reduction with flap necrosis  Renal/GU negative Renal ROS  negative genitourinary   Musculoskeletal negative musculoskeletal ROS (+)    Abdominal  (+) + obese  Peds  Hematology  (+) Blood dyscrasia (Hb 8.3), anemia   Anesthesia Other Findings   Reproductive/Obstetrics LMP 07/12/2022                              Anesthesia Physical Anesthesia Plan  ASA: 2  Anesthesia Plan: General   Post-op Pain Management: Dilaudid IV   Induction: Intravenous  PONV Risk Score and Plan: 3 and Ondansetron, Dexamethasone and Scopolamine patch - Pre-op  Airway Management Planned: LMA  Additional Equipment: None  Intra-op Plan:   Post-operative Plan: Extubation in OR  Informed Consent: I have reviewed the patients History and Physical, chart, labs and discussed the procedure including the risks, benefits and alternatives for the proposed anesthesia with the patient or authorized representative who has indicated his/her understanding and acceptance.     Dental advisory  given  Plan Discussed with: CRNA, Anesthesiologist and Surgeon  Anesthesia Plan Comments:         Anesthesia Quick Evaluation

## 2022-08-15 ENCOUNTER — Encounter (HOSPITAL_COMMUNITY): Payer: Self-pay | Admitting: Plastic Surgery

## 2022-08-15 ENCOUNTER — Telehealth: Payer: Self-pay | Admitting: *Deleted

## 2022-08-15 ENCOUNTER — Institutional Professional Consult (permissible substitution): Payer: No Typology Code available for payment source | Admitting: Plastic Surgery

## 2022-08-15 DIAGNOSIS — T8131XA Disruption of external operation (surgical) wound, not elsewhere classified, initial encounter: Secondary | ICD-10-CM | POA: Diagnosis not present

## 2022-08-15 LAB — HIV ANTIBODY (ROUTINE TESTING W REFLEX): HIV Screen 4th Generation wRfx: NONREACTIVE

## 2022-08-15 MED ORDER — ONDANSETRON 4 MG PO TBDP: 4.0000 mg | ORAL_TABLET | Freq: Three times a day (TID) | ORAL | 0 refills | Status: DC | PRN

## 2022-08-15 MED ORDER — OXYCODONE-ACETAMINOPHEN 5-325 MG PO TABS: 1.0000 | ORAL_TABLET | Freq: Three times a day (TID) | ORAL | 0 refills | Status: AC | PRN

## 2022-08-15 MED ORDER — OXYCODONE HCL 5 MG PO TABS
5.0000 mg | ORAL_TABLET | Freq: Once | ORAL | Status: AC
  Administered 2022-08-15: 5 mg via ORAL
  Filled 2022-08-15: qty 1

## 2022-08-15 NOTE — TOC Initial Note (Signed)
Transition of Care [facility]) - Initial/Assessment Note    Patient Details  Name: Gail Brewer MRN: [id number] Date of Birth: 05-06-1976  [facility]) CM/SW Contact:    Ninfa Meeker, RN Phone Number: 08/15/2022, 10:06 AM  Clinical Narrative:                  Patient is 47 yr old female s/p debridement and washout of left breast with application of wound vac. Case Manager delivered Vac to patient's room for home use. She will be following with Plastics for wound vac changes. No further needs identified.         Patient Goals and CMS Choice            Expected Discharge Plan and Services                                              Prior Living Arrangements/Services                       Activities of Daily Living Home Assistive Devices/Equipment: None ADL Screening (condition at time of admission) Patient's cognitive ability adequate to safely complete daily activities?: Yes Is the patient deaf or have difficulty hearing?: No Does the patient have difficulty seeing, even when wearing glasses/contacts?: No Does the patient have difficulty concentrating, remembering, or making decisions?: No Patient able to express need for assistance with ADLs?: Yes Does the patient have difficulty dressing or bathing?: No Independently performs ADLs?: Yes (appropriate for developmental age) Does the patient have difficulty walking or climbing stairs?: No Weakness of Legs: None Weakness of Arms/Hands: None  Permission Sought/Granted                  Emotional Assessment              Admission diagnosis:  S/P debridement [Z98.890] Patient Active Problem List   Diagnosis Date Noted   S/P debridement 08/14/2022   Necrosis of entire skin flap ([place]) 07/22/2022   PCP:  Lin Landsman, MD Pharmacy:   [facility], [place] - [address] [place]  [address] Phone: [phone] Fax: [phone]     Social Determinants of Health (SDOH) Social History: SDOH Screenings   Food Insecurity: No Food Insecurity (08/14/2022)  Housing: Low Risk  (08/14/2022)  Transportation Needs: No Transportation Needs (08/14/2022)  Utilities: Not At Risk (08/14/2022)  Depression (PHQ2-9): Low Risk  (09/09/2021)  Tobacco Use: Medium Risk (08/14/2022)   SDOH Interventions:     Readmission Risk Interventions     No data to display

## 2022-08-15 NOTE — Progress Notes (Addendum)
[facility] home wound VAC delivered to room, POD signed by patient and faxed back to [facility].

## 2022-08-15 NOTE — Discharge Summary (Signed)
Physician Discharge Summary  Patient ID: Gail Brewer MRN: [id number] DOB/AGE: [birth year] 47 y.o.  Admit date: 08/14/2022 Discharge date: 08/15/2022  Admission Diagnoses:  Discharge Diagnoses:  Principal Problem:   S/P debridement   Discharged Condition: Patient resting comfortably in bed eating breakfast on exam today.  She is accompanied by her son at bedside.  Eating and drinking without difficulty.  Voiding.  Ambulatory.  Wound VAC in possession.  She understands that she can call the clinic should she have any questions or concerns over the weekend.  Pain controlled with oral analgesics.  Hospital Course: Admitted for observation after debridement of left breast and placement of wound VAC given patient's postoperative complications from breast reduction surgery 06/2022.  Consults: None  Significant Diagnostic Studies: None  Treatments: surgery: Debridement of left breast with washout and placement of wound VAC.  Discharge Exam: Blood pressure 117/70, pulse 61, temperature 98.2 F (36.8 C), temperature source Oral, resp. rate 17, height 5\' 5"  (1.651 m), weight 104.3 kg, last menstrual period 07/12/2022, SpO2 99 %, unknown if currently breastfeeding. General appearance: alert, cooperative, and no distress Breasts: VAC in place with good suction and seal.  Minimal fluid in canister. Extremities: No lower extremity swelling or edema.  Disposition: Discharge home, son to assist with care and drive.  She understands plan to return to the OR 08/18/2022 for repeat VAC change and placement of tissue matrix.   Allergies as of 08/15/2022       Reactions   Flagyl [metronidazole] Other (See Comments)   Yeast infection        Medication List     TAKE these medications    Advil 200 MG tablet Generic drug: ibuprofen Take 600 mg by mouth every 6 (six) hours as needed for headache, mild pain or moderate pain.   cephALEXin 250 MG capsule Commonly known as: KEFLEX Take 250 mg  by mouth 4 (four) times daily. 7 day course.   ondansetron 4 MG disintegrating tablet Commonly known as: ZOFRAN-ODT Take 1 tablet (4 mg total) by mouth every 8 (eight) hours as needed for nausea or vomiting.   oxyCODONE 5 MG immediate release tablet Commonly known as: Roxicodone Take 1 tablet (5 mg total) by mouth every 4 (four) hours as needed for up to 10 doses for severe pain.   oxyCODONE-acetaminophen 5-325 MG tablet Commonly known as: Percocet Take 1 tablet by mouth every 8 (eight) hours as needed for up to 7 days for severe pain. What changed: when to take this        Follow-up Information     [facility] home wound VAC Follow up.   Why: If unit alarms or if experiencing issues contact the home health, wound  care clinic, or physician's office that is managing the V.A.C. Therapy.  If unsuccessful, call [facility] Technical Support at [phone], option 3. Contact information: General Questions: [phone] Order Supplies: [phone], option 2, then dial ext. [place] Technical Support: [phone], option 3.                [facility] [address] [place], [place] [address] [phone]  Signed: Krista Blue 08/15/2022, 11:33 AM

## 2022-08-15 NOTE — Telephone Encounter (Signed)
Spoke with patient, notified of sx 1/29 w/ arrival at [facility] at 2p. Also aware of appointment in office on 2/1 @ 9am

## 2022-08-15 NOTE — Progress Notes (Signed)
Spoke with pt for pre-op call. Pt just had surgery here yesterday and was admitted. Pt is waiting for her ride home. She was willing to do the phone call now. Pt denies HTN or diabetes. Pt states she had a heart murmur as a child.   Shower/bath instructions given to pt and she voiced understanding.

## 2022-08-18 ENCOUNTER — Observation Stay (HOSPITAL_COMMUNITY)
Admission: RE | Admit: 2022-08-18 | Discharge: 2022-08-19 | Disposition: A | Discharge status: Home / Self Care | Payer: Medicaid Other | Attending: Plastic Surgery | Admitting: Plastic Surgery

## 2022-08-18 ENCOUNTER — Ambulatory Visit (HOSPITAL_BASED_OUTPATIENT_CLINIC_OR_DEPARTMENT_OTHER): Payer: Medicaid Other | Admitting: Anesthesiology

## 2022-08-18 ENCOUNTER — Other Ambulatory Visit: Payer: Self-pay

## 2022-08-18 ENCOUNTER — Encounter (HOSPITAL_COMMUNITY): Payer: Self-pay | Admitting: Plastic Surgery

## 2022-08-18 ENCOUNTER — Ambulatory Visit (HOSPITAL_COMMUNITY): Payer: Medicaid Other | Admitting: Anesthesiology

## 2022-08-18 ENCOUNTER — Encounter: Payer: No Typology Code available for payment source | Admitting: Physician Assistant

## 2022-08-18 ENCOUNTER — Encounter (HOSPITAL_COMMUNITY)
Admission: RE | Disposition: A | Discharge status: Home / Self Care | Payer: Self-pay | Source: Home / Self Care | Attending: Plastic Surgery

## 2022-08-18 DIAGNOSIS — T8131XD Disruption of external operation (surgical) wound, not elsewhere classified, subsequent encounter: Secondary | ICD-10-CM | POA: Diagnosis not present

## 2022-08-18 DIAGNOSIS — Y828 Other medical devices associated with adverse incidents: Secondary | ICD-10-CM | POA: Diagnosis not present

## 2022-08-18 DIAGNOSIS — T8131XA Disruption of external operation (surgical) wound, not elsewhere classified, initial encounter: Secondary | ICD-10-CM | POA: Diagnosis present

## 2022-08-18 DIAGNOSIS — Z01818 Encounter for other preprocedural examination: Secondary | ICD-10-CM

## 2022-08-18 DIAGNOSIS — Z9889 Other specified postprocedural states: Secondary | ICD-10-CM | POA: Diagnosis not present

## 2022-08-18 DIAGNOSIS — Z87891 Personal history of nicotine dependence: Secondary | ICD-10-CM | POA: Insufficient documentation

## 2022-08-18 DIAGNOSIS — T8130XA Disruption of wound, unspecified, initial encounter: Secondary | ICD-10-CM | POA: Diagnosis not present

## 2022-08-18 HISTORY — PX: APPLICATION OF WOUND VAC: SHX5189

## 2022-08-18 LAB — POCT PREGNANCY, URINE: Preg Test, Ur: NEGATIVE

## 2022-08-18 SURGERY — APPLICATION, SKIN SUBSTITUTE
Anesthesia: General | Site: Breast | Laterality: Left

## 2022-08-18 MED ORDER — FENTANYL CITRATE (PF) 100 MCG/2ML IJ SOLN
INTRAMUSCULAR | Status: AC
  Filled 2022-08-18: qty 2

## 2022-08-18 MED ORDER — ORAL CARE MOUTH RINSE: 15.0000 mL | Freq: Once | OROMUCOSAL | Status: AC

## 2022-08-18 MED ORDER — LACTATED RINGERS IV SOLN: INTRAVENOUS | Status: DC | PRN

## 2022-08-18 MED ORDER — AMISULPRIDE (ANTIEMETIC) 5 MG/2ML IV SOLN: 10.0000 mg | Freq: Once | INTRAVENOUS | Status: DC | PRN

## 2022-08-18 MED ORDER — OXYCODONE HCL 5 MG PO TABS
5.0000 mg | ORAL_TABLET | ORAL | Status: DC | PRN
  Administered 2022-08-19 (×2): 10 mg via ORAL
  Filled 2022-08-18 (×2): qty 2

## 2022-08-18 MED ORDER — PHENYLEPHRINE 80 MCG/ML (10ML) SYRINGE FOR IV PUSH (FOR BLOOD PRESSURE SUPPORT)
PREFILLED_SYRINGE | INTRAVENOUS | Status: DC | PRN
  Administered 2022-08-18: 320 ug via INTRAVENOUS
  Administered 2022-08-18 (×4): 160 ug via INTRAVENOUS
  Administered 2022-08-18: 240 ug via INTRAVENOUS

## 2022-08-18 MED ORDER — EPHEDRINE SULFATE-NACL 50-0.9 MG/10ML-% IV SOSY
PREFILLED_SYRINGE | INTRAVENOUS | Status: DC | PRN
  Administered 2022-08-18 (×2): 5 mg via INTRAVENOUS

## 2022-08-18 MED ORDER — PROPOFOL 10 MG/ML IV BOLUS
INTRAVENOUS | Status: DC | PRN
  Administered 2022-08-18: 200 mg via INTRAVENOUS
  Administered 2022-08-18: 50 mg via INTRAVENOUS

## 2022-08-18 MED ORDER — IBUPROFEN 600 MG PO TABS
600.0000 mg | ORAL_TABLET | Freq: Four times a day (QID) | ORAL | Status: DC | PRN
  Administered 2022-08-18: 600 mg via ORAL
  Filled 2022-08-18: qty 1

## 2022-08-18 MED ORDER — ACETAMINOPHEN 500 MG PO TABS
500.0000 mg | ORAL_TABLET | Freq: Once | ORAL | Status: AC
  Administered 2022-08-18: 500 mg via ORAL

## 2022-08-18 MED ORDER — ONDANSETRON HCL 4 MG/2ML IJ SOLN
INTRAMUSCULAR | Status: DC | PRN
  Administered 2022-08-18: 4 mg via INTRAVENOUS

## 2022-08-18 MED ORDER — LACTATED RINGERS IV SOLN: INTRAVENOUS | Status: DC

## 2022-08-18 MED ORDER — ACETAMINOPHEN 500 MG PO TABS
1000.0000 mg | ORAL_TABLET | Freq: Four times a day (QID) | ORAL | Status: DC
  Administered 2022-08-19 (×2): 1000 mg via ORAL
  Filled 2022-08-18 (×3): qty 2

## 2022-08-18 MED ORDER — FENTANYL CITRATE (PF) 100 MCG/2ML IJ SOLN
25.0000 ug | INTRAMUSCULAR | Status: DC | PRN
  Administered 2022-08-18 (×2): 50 ug via INTRAVENOUS

## 2022-08-18 MED ORDER — PROPOFOL 10 MG/ML IV BOLUS
INTRAVENOUS | Status: AC
  Filled 2022-08-18: qty 20

## 2022-08-18 MED ORDER — FENTANYL CITRATE (PF) 250 MCG/5ML IJ SOLN
INTRAMUSCULAR | Status: AC
  Filled 2022-08-18: qty 5

## 2022-08-18 MED ORDER — MIDAZOLAM HCL 2 MG/2ML IJ SOLN
INTRAMUSCULAR | Status: DC | PRN
  Administered 2022-08-18: 2 mg via INTRAVENOUS

## 2022-08-18 MED ORDER — CHLORHEXIDINE GLUCONATE 0.12 % MT SOLN
15.0000 mL | Freq: Once | OROMUCOSAL | Status: AC
  Administered 2022-08-18: 15 mL via OROMUCOSAL
  Filled 2022-08-18: qty 15

## 2022-08-18 MED ORDER — POLYETHYLENE GLYCOL 3350 17 G PO PACK: 17.0000 g | PACK | Freq: Every day | ORAL | Status: DC | PRN

## 2022-08-18 MED ORDER — DEXAMETHASONE SODIUM PHOSPHATE 10 MG/ML IJ SOLN
INTRAMUSCULAR | Status: DC | PRN
  Administered 2022-08-18: 10 mg via INTRAVENOUS

## 2022-08-18 MED ORDER — LIDOCAINE-EPINEPHRINE 1 %-1:100000 IJ SOLN
INTRAMUSCULAR | Status: AC
  Filled 2022-08-18: qty 1

## 2022-08-18 MED ORDER — 0.9 % SODIUM CHLORIDE (POUR BTL) OPTIME
TOPICAL | Status: DC | PRN
  Administered 2022-08-18: 1000 mL

## 2022-08-18 MED ORDER — DIPHENHYDRAMINE HCL 50 MG/ML IJ SOLN: 25.0000 mg | Freq: Four times a day (QID) | INTRAMUSCULAR | Status: DC | PRN

## 2022-08-18 MED ORDER — ACETAMINOPHEN 500 MG PO TABS
1000.0000 mg | ORAL_TABLET | Freq: Once | ORAL | Status: DC
  Filled 2022-08-18: qty 2

## 2022-08-18 MED ORDER — DIPHENHYDRAMINE HCL 25 MG PO CAPS: 25.0000 mg | ORAL_CAPSULE | Freq: Four times a day (QID) | ORAL | Status: DC | PRN

## 2022-08-18 MED ORDER — CEFAZOLIN SODIUM-DEXTROSE 2-4 GM/100ML-% IV SOLN
2.0000 g | INTRAVENOUS | Status: AC
  Administered 2022-08-18: 2 g via INTRAVENOUS
  Filled 2022-08-18: qty 100

## 2022-08-18 MED ORDER — LIDOCAINE 2% (20 MG/ML) 5 ML SYRINGE
INTRAMUSCULAR | Status: DC | PRN
  Administered 2022-08-18: 40 mg via INTRAVENOUS

## 2022-08-18 MED ORDER — PROMETHAZINE HCL 25 MG/ML IJ SOLN: 6.2500 mg | INTRAMUSCULAR | Status: DC | PRN

## 2022-08-18 MED ORDER — MIDAZOLAM HCL 2 MG/2ML IJ SOLN
INTRAMUSCULAR | Status: AC
  Filled 2022-08-18: qty 2

## 2022-08-18 MED ORDER — FENTANYL CITRATE (PF) 250 MCG/5ML IJ SOLN
INTRAMUSCULAR | Status: DC | PRN
  Administered 2022-08-18 (×2): 50 ug via INTRAVENOUS

## 2022-08-18 SURGICAL SUPPLY — 61 items
APPLICATOR COTTON TIP 6 STRL (MISCELLANEOUS) IMPLANT
APPLICATOR COTTON TIP 6IN STRL (MISCELLANEOUS) IMPLANT
BAG COUNTER SPONGE SURGICOUNT (BAG) ×1 IMPLANT
BAG DECANTER FOR FLEXI CONT (MISCELLANEOUS) IMPLANT
BENZOIN TINCTURE PRP APPL 2/3 (GAUZE/BANDAGES/DRESSINGS) ×1 IMPLANT
CANISTER SUCT 3000ML PPV (MISCELLANEOUS) ×1 IMPLANT
CANISTER WOUND CARE 500ML ATS (WOUND CARE) IMPLANT
CNTNR URN SCR LID CUP LEK RST (MISCELLANEOUS) IMPLANT
CONT SPEC 4OZ STRL OR WHT (MISCELLANEOUS)
COVER SURGICAL LIGHT HANDLE (MISCELLANEOUS) ×1 IMPLANT
DRAIN CHANNEL 19F RND (DRAIN) IMPLANT
DRAIN JP 10F RND SILICONE (MISCELLANEOUS) IMPLANT
DRAPE DERMATAC (DRAPES) IMPLANT
DRAPE HALF SHEET 40X57 (DRAPES) IMPLANT
DRAPE IMP U-DRAPE 54X76 (DRAPES) ×1 IMPLANT
DRAPE INCISE IOBAN 66X45 STRL (DRAPES) IMPLANT
DRAPE LAPAROSCOPIC ABDOMINAL (DRAPES) IMPLANT
DRAPE LAPAROTOMY 100X72 PEDS (DRAPES) ×1 IMPLANT
DRSG ADAPTIC 3X8 NADH LF (GAUZE/BANDAGES/DRESSINGS) IMPLANT
DRSG CALCIUM ALGINATE 4X4 (GAUZE/BANDAGES/DRESSINGS) IMPLANT
DRSG CUTIMED SORBACT 7X9 (GAUZE/BANDAGES/DRESSINGS) IMPLANT
DRSG VAC ATS LRG SENSATRAC (GAUZE/BANDAGES/DRESSINGS) IMPLANT
DRSG VAC ATS MED SENSATRAC (GAUZE/BANDAGES/DRESSINGS) IMPLANT
DRSG VAC ATS SM SENSATRAC (GAUZE/BANDAGES/DRESSINGS) IMPLANT
DRSG VAC GRANUFOAM MED (GAUZE/BANDAGES/DRESSINGS) IMPLANT
ELECT CAUTERY BLADE 6.4 (BLADE) IMPLANT
ELECT REM PT RETURN 9FT ADLT (ELECTROSURGICAL) ×1
ELECTRODE REM PT RTRN 9FT ADLT (ELECTROSURGICAL) ×1 IMPLANT
GAUZE PAD ABD 8X10 STRL (GAUZE/BANDAGES/DRESSINGS) IMPLANT
GAUZE SPONGE 4X4 12PLY STRL (GAUZE/BANDAGES/DRESSINGS) IMPLANT
GLOVE BIO SURGEON STRL SZ7.5 (GLOVE) IMPLANT
GLOVE BIO SURGEON STRL SZ8 (GLOVE) ×1 IMPLANT
GLOVE BIOGEL PI IND STRL 7.5 (GLOVE) IMPLANT
GLOVE BIOGEL PI IND STRL 8 (GLOVE) ×1 IMPLANT
GOWN STRL REUS W/ TWL LRG LVL3 (GOWN DISPOSABLE) ×3 IMPLANT
GOWN STRL REUS W/TWL LRG LVL3 (GOWN DISPOSABLE) ×2
GOWN STRL REUS W/TWL XL LVL3 (GOWN DISPOSABLE) ×1 IMPLANT
GRAFT MYRIAD 3 LAYER 7X10 (Graft) IMPLANT
KIT BASIN OR (CUSTOM PROCEDURE TRAY) ×1 IMPLANT
KIT TURNOVER KIT B (KITS) ×1 IMPLANT
MARKER PEN SURG W/LABELS BLK (STERILIZATION PRODUCTS) IMPLANT
NDL HYPO 25GX1X1/2 BEV (NEEDLE) ×1 IMPLANT
NEEDLE HYPO 25GX1X1/2 BEV (NEEDLE) ×1 IMPLANT
NS IRRIG 1000ML POUR BTL (IV SOLUTION) ×1 IMPLANT
PACK GENERAL/GYN (CUSTOM PROCEDURE TRAY) ×1 IMPLANT
PACK UNIVERSAL I (CUSTOM PROCEDURE TRAY) ×1 IMPLANT
PAD ARMBOARD 7.5X6 YLW CONV (MISCELLANEOUS) ×2 IMPLANT
POWDER MYRIAD MORCELLS 500MG (Miscellaneous) IMPLANT
STAPLER VISISTAT 35W (STAPLE) ×1 IMPLANT
SURGILUBE 2OZ TUBE FLIPTOP (MISCELLANEOUS) IMPLANT
SUT MNCRL AB 3-0 PS2 27 (SUTURE) IMPLANT
SUT MNCRL AB 4-0 PS2 18 (SUTURE) IMPLANT
SUT MON AB 2-0 CT1 36 (SUTURE) IMPLANT
SUT MON AB 5-0 PS2 18 (SUTURE) IMPLANT
SUT VIC AB 5-0 PS2 18 (SUTURE) IMPLANT
SUT VICRYL 3 0 (SUTURE) IMPLANT
SWAB COLLECTION DEVICE MRSA (MISCELLANEOUS) IMPLANT
SWAB CULTURE ESWAB REG 1ML (MISCELLANEOUS) IMPLANT
SYR CONTROL 10ML LL (SYRINGE) ×1 IMPLANT
TOWEL GREEN STERILE (TOWEL DISPOSABLE) ×1 IMPLANT
UNDERPAD 30X36 HEAVY ABSORB (UNDERPADS AND DIAPERS) ×1 IMPLANT

## 2022-08-18 NOTE — Anesthesia Postprocedure Evaluation (Signed)
Anesthesia Post Note  Patient: Gail Brewer  Procedure(s) Performed: Washout and placement of wound vac (Left: Breast) APPLICATION OF WOUND VAC (Left: Breast)     Patient location during evaluation: PACU Anesthesia Type: General Level of consciousness: awake and alert Pain management: pain level controlled Vital Signs Assessment: post-procedure vital signs reviewed and stable Respiratory status: spontaneous breathing, nonlabored ventilation, respiratory function stable and patient connected to nasal cannula oxygen Cardiovascular status: blood pressure returned to baseline and stable Postop Assessment: no apparent nausea or vomiting Anesthetic complications: no   No notable events documented.  Last Vitals:  Vitals:   08/15/22 0318 08/15/22 0749  BP: 103/60 117/70  Pulse: 65 61  Resp: 20 17  Temp: (!) 36.3 C 36.8 C  SpO2: 100% 99%    Last Pain:  Vitals:   08/15/22 1600  TempSrc:   PainSc: 7                  Tiajuana Amass

## 2022-08-18 NOTE — Transfer of Care (Signed)
Immediate Anesthesia Transfer of Care Note  Patient: Gail Brewer  Procedure(s) Performed: Wound vac change and placement of Myriad (Left: Breast) APPLICATION OF WOUND VAC (Left: Breast)  Patient Location: PACU  Anesthesia Type:General  Level of Consciousness: awake, alert , and drowsy  Airway & Oxygen Therapy: Patient Spontanous Breathing  Post-op Assessment: Report given to RN, Post -op Vital signs reviewed and stable, and Patient moving all extremities X 4  Post vital signs: Reviewed and stable  Last Vitals:  Vitals Value Taken Time  BP    Temp    Pulse 89 08/18/22 1904  Resp 21 08/18/22 1904  SpO2 94 % 08/18/22 1904  Vitals shown include unvalidated device data.  Last Pain:  Vitals:   08/18/22 1441  PainSc: 5          Complications: No notable events documented.

## 2022-08-18 NOTE — Plan of Care (Signed)

## 2022-08-18 NOTE — Anesthesia Preprocedure Evaluation (Signed)
Anesthesia Evaluation  Patient identified by MRN, date of birth, ID band Patient awake    Reviewed: Allergy & Precautions, NPO status , Patient's Chart, lab work & pertinent test results  History of Anesthesia Complications Negative for: history of anesthetic complications  Airway Mallampati: II  TM Distance: >3 FB Neck ROM: Full    Dental no notable dental hx. (+) Dental Advisory Given, Teeth Intact   Pulmonary former smoker   Pulmonary exam normal breath sounds clear to auscultation       Cardiovascular negative cardio ROS Normal cardiovascular exam+ Valvular Problems/Murmurs (remote h/o murmur)  Rhythm:Regular Rate:Normal     Neuro/Psych  Headaches PSYCHIATRIC DISORDERS Anxiety Depression       GI/Hepatic negative GI ROS, Neg liver ROS,,,  Endo/Other    Morbid obesityObesity Open wound left breast S/P bilateral mammary reduction with flap necrosis  Renal/GU negative Renal ROS  negative genitourinary   Musculoskeletal negative musculoskeletal ROS (+)    Abdominal  (+) + obese  Peds  Hematology  (+) Blood dyscrasia (Hb 8.3), anemia   Anesthesia Other Findings   Reproductive/Obstetrics LMP 07/12/2022                              Anesthesia Physical Anesthesia Plan  ASA: 2  Anesthesia Plan: General   Post-op Pain Management: Dilaudid IV   Induction: Intravenous  PONV Risk Score and Plan: 3 and Ondansetron, Dexamethasone and Scopolamine patch - Pre-op  Airway Management Planned: LMA  Additional Equipment: None  Intra-op Plan:   Post-operative Plan: Extubation in OR  Informed Consent: I have reviewed the patients History and Physical, chart, labs and discussed the procedure including the risks, benefits and alternatives for the proposed anesthesia with the patient or authorized representative who has indicated his/her understanding and acceptance.     Dental advisory  given  Plan Discussed with: CRNA, Anesthesiologist and Surgeon  Anesthesia Plan Comments:         Anesthesia Quick Evaluation  

## 2022-08-18 NOTE — Interval H&P Note (Signed)
History and Physical Interval Note:  Pt met in the holding area, no change in exam or indication for surgery. Surgical site marked, will proceed with debridement of left breast at patients request.  08/18/2022 5:58 PM  Gail Brewer  has presented today for surgery, with the diagnosis of Dehiscence of surgical wound.  The various methods of treatment have been discussed with the patient and family. After consideration of risks, benefits and other options for treatment, the patient has consented to  Procedure(s): Wound vac change and placement of Myriad (Left) APPLICATION OF WOUND VAC (Left) as a surgical intervention.  The patient's history has been reviewed, patient examined, no change in status, stable for surgery.  I have reviewed the patient's chart and labs.  Questions were answered to the patient's satisfaction.     [name]

## 2022-08-18 NOTE — Op Note (Signed)
DATE OF OPERATION: 08/18/2022  LOCATION: [facility] operating Room  PREOPERATIVE DIAGNOSIS: Wound dehiscence left breast  POSTOPERATIVE DIAGNOSIS: Same  PROCEDURE: Wound VAC change irrigation of wound and placement of myriad powder and matrix  SURGEON: Jeanann Lewandowsky, MD  ASSISTANT: Matt Scheeler  EBL: 10 cc  CONDITION: Stable  COMPLICATIONS: None  INDICATION: The patient, Gail Brewer, is a 47 y.o. female born on 1975/07/26, is here for treatment of a wound dehiscence due to ischemia after breast reduction.   PROCEDURE DETAILS:  The patient was seen prior to surgery and marked.   IV antibiotics were given. The patient was taken to the operating room and given a general anesthetic. A standard time out was performed and all information was confirmed by those in the room. SCDs were placed.   The left breast was prepped and draped in usual sterile manner the wound VAC was removed and the wound was irrigated with saline.  All surfaces were noted to be clean with excellent granulation tissue and without evidence of ongoing ischemia.  The pocket measuring 5 cm x 10 cm was washed out and 500 g of myriad morsels were placed in the base of the wound and a 5 x 7 cm portion of 3 layer matrix was placed in the wound coapting the pedicle and the skin flap.  A portion of Sorbact was then placed over the remainder of the wound which measured 12 x 11 cm and sutured in place with 3-0 Monocryl suture.  The wound VAC sponge was cut to the size of the wound placed over the wound and the adherent dressings placed over the sponge.  The sponge was placed to continuous suction at 125 mmHg.  The wound VAC was noted to be working well at the conclusion of the case.  All instrument needle and sponge counts were reported as correct The patient was allowed to wake up and taken to recovery room in stable condition at the end of the case. The family was notified at the end of the case.   The advanced practice practitioner  (APP), Mr. Verdie Shire, assisted throughout the case.  The APP was essential in retraction and counter traction when needed to make the case progress smoothly.  This retraction and assistance made it possible to see the tissue plans for the procedure.  The assistance was needed for blood control, tissue re-approximation and assisted with closure of the incision site.

## 2022-08-18 NOTE — Anesthesia Procedure Notes (Addendum)
Procedure Name: LMA Insertion Date/Time: 08/18/2022 6:12 PM  Performed by: Suzy Bouchard, CRNAPre-anesthesia Checklist: Patient identified, Emergency Drugs available, Suction available, Patient being monitored and Timeout performed Patient Re-evaluated:Patient Re-evaluated prior to induction Oxygen Delivery Method: Circle system utilized Preoxygenation: Pre-oxygenation with 100% oxygen Induction Type: IV induction Ventilation: Mask ventilation without difficulty LMA: LMA inserted LMA Size: 4.0 Tube type: Oral Number of attempts: 1 Placement Confirmation: positive ETCO2 and breath sounds checked- equal and bilateral Tube secured with: Tape Dental Injury: Teeth and Oropharynx as per pre-operative assessment

## 2022-08-19 ENCOUNTER — Encounter (HOSPITAL_COMMUNITY): Payer: Self-pay | Admitting: Plastic Surgery

## 2022-08-19 DIAGNOSIS — T8131XA Disruption of external operation (surgical) wound, not elsewhere classified, initial encounter: Secondary | ICD-10-CM | POA: Diagnosis not present

## 2022-08-19 NOTE — TOC CM/SW Note (Signed)
Patient from home . Already has home wound VAC in room. Per patient plan to attempt to change VAC in MD office Friday 08/22/22

## 2022-08-19 NOTE — Discharge Summary (Signed)
Physician Discharge Summary  Patient ID: DEAJA RIZO MRN: [id number] DOB/AGE: [birth year] 45 y.o.  Admit date: 08/18/2022 Discharge date: 08/19/2022  Admission Diagnoses:  Discharge Diagnoses:  Principal Problem:   Dehiscence of surgical wound, subsequent encounter   Discharged Condition: Good  Hospital Course: This is a 47 year old female that is post bilateral breast reduction on 07/11/2022 by Dr. 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  She presented to [place] on 08/14/2022.  She was taken to the OR on 08/14/2022 for debridement.  She was taken back yesterday on 08/18/2022 by Dr. Lovena Le for debridement with application of myriad and a wound VAC.  The patient notes she is doing well this morning, she denies any fever, she notes some pain in the left breast.  The wound VAC has put out very minimal output.  Consults: None  Significant Diagnostic Studies: None   Discharge Exam: Blood pressure 133/77, pulse (!) 54, temperature 98.4 F (36.9 C), temperature source Oral, resp. rate 17, height 5\' 6"  (1.676 m), weight 102.1 kg, last menstrual period 08/16/2022, SpO2 99 %, unknown if currently breastfeeding. Chaperone present.  Patient resting comfortably in exam bed.  Left breast with wound VAC in place with good seal, no surrounding redness, very minimal serous in the wound VAC canister.  Remaining breast soft with no palpable fluid collections.  Mepilex border on the right lower breast  Disposition: Discharge disposition: 01-Home or Self Care       Overall the patient is doing well postop day 1.  She is stable for discharge home, she would like to go home today.  She notes she has pain medicine at home.  No antibiotics indicated at this time.  We have her scheduled on Friday for wound VAC change in our office.  She understands to reach out to [place] immediately if she develops any new or worsening signs or  symptoms.  She verbalized understanding and agreement to today's plan had no further questions or concerns.   Discharge Instructions     Call MD for:  difficulty breathing, headache or visual disturbances   Complete by: As directed    Call MD for:  extreme fatigue   Complete by: As directed    Call MD for:  hives   Complete by: As directed    Call MD for:  persistant dizziness or light-headedness   Complete by: As directed    Call MD for:  persistant nausea and vomiting   Complete by: As directed    Call MD for:  redness, tenderness, or signs of infection (pain, swelling, redness, odor or green/yellow discharge around incision site)   Complete by: As directed    Call MD for:  severe uncontrolled pain   Complete by: As directed    Call MD for:  temperature >100.4   Complete by: As directed    Diet - low sodium heart healthy   Complete by: As directed    Discharge wound care:   Complete by: As directed    Please leave wound vac in place, call with any issues or concerns   Increase activity slowly   Complete by: As directed       Allergies as of 08/19/2022       Reactions   Flagyl [metronidazole] Other (See Comments)   Yeast infection        Medication List     TAKE these medications    ibuprofen 800 MG tablet  Commonly known as: ADVIL Take 800 mg by mouth every 8 (eight) hours as needed (pain.).   ondansetron 4 MG disintegrating tablet Commonly known as: ZOFRAN-ODT Take 1 tablet (4 mg total) by mouth every 8 (eight) hours as needed for nausea or vomiting.   oxyCODONE 5 MG immediate release tablet Commonly known as: Roxicodone Take 1 tablet (5 mg total) by mouth every 4 (four) hours as needed for up to 10 doses for severe pain.   oxyCODONE-acetaminophen 5-325 MG tablet Commonly known as: Percocet Take 1 tablet by mouth every 8 (eight) hours as needed for up to 7 days for severe pain.   sulfamethoxazole-trimethoprim 800-160 MG tablet Commonly known as: BACTRIM  DS Take 1 tablet by mouth 2 (two) times daily.               Discharge Care Instructions  (From admission, onward)           Start     Ordered   08/19/22 0000  Discharge wound care:       Comments: Please leave wound vac in place, call with any issues or concerns   08/19/22 [facility] [place], [place] [address] [phone]  Signed: Stevie Kern Merryl Buckels 08/19/2022, 10:06 AM

## 2022-08-19 NOTE — Plan of Care (Incomplete)
Pt A&OX4, BP 133/77 (BP Location: Right Arm)   Pulse (!) 54   Temp 98.4 F (36.9 C) (Oral)   Resp 17   Ht 5\' 6"  (1.676 m)   Wt 102.1 kg   LMP 08/16/2022   SpO2 99%   BMI 36.32 kg/m  Pt IV removed. Discharge teaching reviewed , AVS reviewed with no further questions. Pt s/s of infection.

## 2022-08-19 NOTE — Anesthesia Postprocedure Evaluation (Signed)
Anesthesia Post Note  Patient: Gail Brewer  Procedure(s) Performed: Wound vac change and placement of Myriad (Left: Breast) APPLICATION OF WOUND VAC (Left: Breast)     Patient location during evaluation: PACU Anesthesia Type: General Level of consciousness: awake and alert Pain management: pain level controlled Vital Signs Assessment: post-procedure vital signs reviewed and stable Respiratory status: spontaneous breathing, nonlabored ventilation, respiratory function stable and patient connected to nasal cannula oxygen Cardiovascular status: blood pressure returned to baseline and stable Postop Assessment: no apparent nausea or vomiting Anesthetic complications: no  No notable events documented.  Last Vitals:  Vitals:   08/18/22 2248 08/19/22 0522  BP: 132/65 116/76  Pulse: 61 74  Resp: 17 16  Temp: 36.9 C 36.7 C  SpO2:  99%    Last Pain:  Vitals:   08/19/22 0522  TempSrc: Oral  PainSc:                  [name]

## 2022-08-21 ENCOUNTER — Ambulatory Visit: Payer: No Typology Code available for payment source | Admitting: Physician Assistant

## 2022-08-21 VITALS — BP 118/76 | HR 75

## 2022-08-21 DIAGNOSIS — T8131XS Disruption of external operation (surgical) wound, not elsewhere classified, sequela: Secondary | ICD-10-CM

## 2022-08-21 LAB — AEROBIC/ANAEROBIC CULTURE W GRAM STAIN (SURGICAL/DEEP WOUND): Gram Stain: NONE SEEN

## 2022-08-21 NOTE — Progress Notes (Signed)
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  She then presented to our office and was taken to the operating room 08/14/2022 by Dr. Lovena Le for left breast washout and placement of wound VAC with subsequent OR VAC change and myriad placement 08/18/2022.  She presents to clinic for postoperative follow-up.  Today, she is accompanied by son at bedside.  She took a pain medication prior to arrival in anticipation of today's VAC change.  She states that she has been okay, returned to work yesterday.  Headache overnight, states that she has not been eating well.  Denies any significant breast pain.  She has not had any issues with her wound VAC at home.  On exam, patient has a small 1 x 1 cm superficial wound at inferior T zone right breast.  Applied Vaseline followed by bordered Mepilex dressing.  On the left side, sponge and VAC tape was removed without complication or difficulty.  Sorbact remained firmly in place.  Appeared to have excellent underlying granulation tissue.  Left breast was soft.  Placed K-Y jelly over the Sorbact and then replaced sponge followed by DermaTac VAC tape.  Used regular VAC tape to help provide better seal at edges.  Wound VAC was hooked up and achieved good suction and seal.  Plan is for her to return in 6 days for VAC change.  At that time, we will remove Sorbact dressing.  Depending on how it looks, will either proceed with dressing changes or continue with wound VAC.  May also consider return to the OR for a partial primary closure.  She will call the clinic should she have any questions or concerns.  Picture(s) obtained of the patient and placed in the chart were with the patient's or guardian's permission.

## 2022-08-25 ENCOUNTER — Ambulatory Visit (INDEPENDENT_AMBULATORY_CARE_PROVIDER_SITE_OTHER): Payer: Medicaid Other | Admitting: Physician Assistant

## 2022-08-25 ENCOUNTER — Encounter: Payer: Self-pay | Admitting: Physician Assistant

## 2022-08-25 VITALS — BP 150/93 | HR 77

## 2022-08-25 DIAGNOSIS — T8131XS Disruption of external operation (surgical) wound, not elsewhere classified, sequela: Secondary | ICD-10-CM

## 2022-08-25 NOTE — Progress Notes (Deleted)
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  She then presented to our office and was taken to the operating room 08/14/2022 by Dr. Lovena Le for left breast washout and placement of wound VAC with subsequent OR VAC change and myriad placement 08/18/2022.  She presents to clinic for postoperative follow-up.   She was last seen here in clinic on 08/21/2022.  At that time, VAC change was performed.  Sorbact remained firmly in place.  There appeared to be excellent underlying granulation tissue.  Breast was soft.  On the right side, superficial 1 x 1 cm wound at inferior T zone.  Plan was for return in 6 days for VAC change, may proceed with dressing changes rather than replacing VAC.

## 2022-08-25 NOTE — Progress Notes (Signed)
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  She then presented to our office and was taken to the operating room 08/14/2022 by Dr. Lovena Le for left breast washout and placement of wound VAC with subsequent OR VAC change and myriad placement 08/18/2022.  She presents to clinic for postoperative follow-up.   She was last seen here in clinic on 08/21/2022.  At that time, VAC change was performed.  Sorbact remained firmly in place.  There appeared to be excellent underlying granulation tissue.  Breast was soft.  On the right side, superficial 1 x 1 cm wound at inferior T zone.  Plan was for return in 6 days for VAC change, may proceed with dressing changes rather than replacing VAC.  She reports that the day after her last wound VAC change here in office the battery to her wound VAC died.  Subsequently, after charging the unit, she noticed a bit of scant drainage followed by increased malodor and subsequent pain.  She has been having some left-sided breast discomfort for the past couple of days.  She denies any fevers or other symptoms.  Wound VAC removed here in clinic without complication or difficulty.  Sorbact removed, mild bleeding provoked.  Tolerated by patient, albeit with discomfort.  On exam, excellent granular tissue noted.  There is an area of tunneling over inferior lateral aspect of granulation site, probed with Q-tip to be at least 2 cm depth.  Cannot visualize on exam.  No significant drainage from the tunneling site.  Breast is still soft, no significant surrounding erythema or induration concerning for cellulitis.  Mild malodor appreciated.  Placed Adaptic followed by K-Y jelly, sponge, and derma tack.  Wound VAC with good seal and function.  The wound VAC was again on low battery, encouraged patient to return ASAP for charging, emphasized importance of keeping it charged at all times.  Will  prescribe additional oxycodone given patient's pain and especially to assist with her planned VAC changes.  No significant evidence on exam for infection, will hold off on antibiotics for now.  Discussed case with Dr. Lovena Le who is agreeable.  Will schedule return visit in 3 days.  If she is feeling perfectly fine and is without complaints, can extend it until following Monday.  Picture(s) obtained of the patient and placed in the chart were with the patient's or guardian's permission.

## 2022-08-26 ENCOUNTER — Telehealth: Payer: Self-pay | Admitting: Physician Assistant

## 2022-08-26 MED ORDER — OXYCODONE HCL 5 MG PO TABS: 5.0000 mg | ORAL_TABLET | Freq: Four times a day (QID) | ORAL | 0 refills | Status: AC | PRN

## 2022-08-26 NOTE — Telephone Encounter (Signed)
Done, thanks

## 2022-08-26 NOTE — Addendum Note (Signed)
Addended by: Krista Blue on: 08/26/2022 10:51 AM   Modules accepted: Orders

## 2022-08-26 NOTE — Telephone Encounter (Signed)
RX Requested: OXYCODONE   LAST Office Visit: 08/25/22 (yesterday)  PHARMACY: [facility]  (on file)  Pt phone [phone]

## 2022-08-27 ENCOUNTER — Encounter: Payer: Medicaid Other | Admitting: Physician Assistant

## 2022-08-28 ENCOUNTER — Ambulatory Visit (INDEPENDENT_AMBULATORY_CARE_PROVIDER_SITE_OTHER): Payer: No Typology Code available for payment source | Admitting: Physician Assistant

## 2022-08-28 VITALS — BP 133/75 | HR 73 | Temp 98.1°F

## 2022-08-28 DIAGNOSIS — T8131XS Disruption of external operation (surgical) wound, not elsewhere classified, sequela: Secondary | ICD-10-CM

## 2022-08-28 NOTE — Progress Notes (Addendum)
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  She then presented to our office and was taken to the operating room 08/14/2022 by Dr. Lovena Le for left breast washout and placement of wound VAC with subsequent OR VAC change and myriad placement 08/18/2022.  She presents to clinic for postoperative follow-up   Today, patient is accompanied by son at bedside.  She states that over the course of the past few days she has noticed continued malodor and has had occasional left-sided breast "aching".  She was concerned about ongoing tissue necrosis.  Denies any fevers or other symptoms.  VAC is removed.  Excellent granular tissue noted throughout.  Wound is 11 x 11 cm, approximately 0.25 cm depth.  The tunneling at inferior lateral aspect of wound can now only be probed approximately 0.5 cm depth with a Q-tip.  No drainage expressible with palpation of breast.  Remainder of breast is soft.  No areas of focal tenderness.  Patient was evaluated by Dr. Lovena Le at bedside.  No obvious evidence concerning for necrosis.  Instead, given the excellent granulation and progression of the tunneling at inferior lateral aspect of wound, plan is to discontinue the wound VAC and transition to dressing changes.  Recommend daily Adaptic with twice daily K-Y jelly application followed by bordered Mepilex dressing.  She can apply ABD pad over top for any drainage as well as to provide additional comfort.  Orders placed with [facility].  Follow-up in 10 days, sooner if needed.  Picture(s) obtained of the patient and placed in the chart were with the patient's or guardian's permission.

## 2022-08-29 ENCOUNTER — Telehealth: Payer: Self-pay | Admitting: Physician Assistant

## 2022-08-29 NOTE — Telephone Encounter (Signed)
Gail Brewer reach out to our office with questions regarding her breast.  She understood the plan at her visit but wanted to verify that we would continue with wound care.  I reviewed her images she does appear to have significant improvement in the wound has great granulation tissue and appears to be healing well.  She will continue the wound care device given at her previous office visit she will reach out to [place] immediately she develops any new or worsening signs or symptoms.  She had no further questions or concerns.

## 2022-09-01 ENCOUNTER — Telehealth: Payer: Self-pay

## 2022-09-01 NOTE — Telephone Encounter (Signed)
Per [facility]: "[facility] will be contacting the patient with pricing options. Their insurance plan does not cover the supplies ordered under their benefits."

## 2022-09-01 NOTE — Telephone Encounter (Signed)
Contacted [facility]- they stated they did not receive patient's [employer]. I refaxed that today with confirmation of receipt.

## 2022-09-03 NOTE — Progress Notes (Unsigned)
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  She then presented to our office and was taken to the operating room 08/14/2022 by Dr. Lovena Le for left breast washout and placement of wound VAC with subsequent OR VAC change and myriad placement 08/18/2022.  She presents to clinic for postoperative follow-up.   She was last seen here in clinic on 08/28/2022.  At that time, VAC was removed and there was excellent granular tissue noted throughout.  Wound measured 11 x 11 cm, approximately 0.25 cm depth.  Tunneling at inferior lateral aspect of wound can only be probed to a depth of 0.5 cm.  No expressible drainage from the area of tunneling.  Patient was evaluated by Dr. Lovena Le and decision was made to remove the wound VAC and transition to dressing changes.  Plan is for Adaptic followed by twice daily K-Y jelly and bordered Mepilex dressing.  Today,

## 2022-09-04 ENCOUNTER — Ambulatory Visit (INDEPENDENT_AMBULATORY_CARE_PROVIDER_SITE_OTHER): Payer: Medicaid Other | Admitting: Physician Assistant

## 2022-09-04 DIAGNOSIS — T8131XS Disruption of external operation (surgical) wound, not elsewhere classified, sequela: Secondary | ICD-10-CM

## 2022-09-08 ENCOUNTER — Telehealth (INDEPENDENT_AMBULATORY_CARE_PROVIDER_SITE_OTHER): Payer: Medicaid Other | Admitting: Physician Assistant

## 2022-09-08 DIAGNOSIS — Z9889 Other specified postprocedural states: Secondary | ICD-10-CM

## 2022-09-08 MED ORDER — KETOROLAC TROMETHAMINE 10 MG PO TABS: 10.0000 mg | ORAL_TABLET | Freq: Three times a day (TID) | ORAL | 0 refills | Status: DC | PRN

## 2022-09-08 NOTE — Telephone Encounter (Signed)
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  She then presented to our office and was taken to the operating room 08/14/2022 by Dr. Lovena Le for left breast washout and placement of wound VAC with subsequent OR VAC change and myriad placement 08/18/2022.  She presents to clinic for postoperative follow-up.    Patient calls into the office inquiring about pain management.  She continues to experience discomfort at her wound site since transitioning from wound VAC to standard dressing changes.  She has been taking ibuprofen without improvement.  She inquires about something that is a step up from ibuprofen and a stepdown from the previously prescribed oxycodone.  She tells me that she is agreeable with moving away from narcotics for ongoing pain management, but that she needs something to help.  Discussed prescription of Toradol 10 mg p.o. every 8 hours as needed for pain control.  Asked that she combine this with Tylenol 500 mg every 6 hours.  Hopefully between these medications along with frequent dressing changes (liberal amounts of K-Y jelly), her pain will improve.  She already has scheduled follow-up with our office next week.  Informed patient that while taking Toradol, she cannot combine with other NSAIDs such as ibuprofen.  She can only take Tylenol in addition for pain control.  If she needs another refill, will need to transition to Mobic given propensity for GI bleed if taken more chronically.

## 2022-09-16 NOTE — Progress Notes (Signed)
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  She then presented to our office and was taken to the operating room 08/14/2022 by Dr. Lovena Le for left breast washout and placement of wound VAC with subsequent OR VAC change and myriad placement 08/18/2022.  She presents to clinic for postoperative follow-up.    She was last seen here in clinic on 09/04/2022.  At that time, she had not yet secured her supplies with [facility].  Emphasized the importance of reaching out to them and answer any phone calls from them given that orders have been placed by our office.  On exam, left sided wound measuring 10 x 11 cm, approximately 0.25 cm depth.  Tunneling at inferior lateral aspect approximately 0.5 cm depth.  Plan for Adaptic followed by K-Y jelly, 4 x 4 gauze, and ultimately secured with 4 inch bordered Mepilex dressing.  Daily dressing changes, follow-up in 2 weeks.  At that time, likely can transition to collagen versus go to the OR.  She then called into the office on 09/08/2022 inquiring about pain management because ibuprofen was not helping.  Discussed avoidance of ongoing narcotic analgesics given how much she had already received in the past month and she was agreeable.  Prescribed 10 mg Toradol every 8 hours as needed for pain symptoms combined with Tylenol 500 mg every 6 hours.    Today,

## 2022-09-18 ENCOUNTER — Ambulatory Visit (INDEPENDENT_AMBULATORY_CARE_PROVIDER_SITE_OTHER): Payer: Medicaid Other | Admitting: Physician Assistant

## 2022-09-18 DIAGNOSIS — Z9889 Other specified postprocedural states: Secondary | ICD-10-CM

## 2022-09-23 ENCOUNTER — Telehealth: Payer: Self-pay | Admitting: Plastic Surgery

## 2022-09-23 NOTE — Telephone Encounter (Signed)
No  auth required [id number].  Clinicals uploaded into [employer] [facility] for [employer].

## 2022-09-24 ENCOUNTER — Telehealth: Payer: Self-pay | Admitting: *Deleted

## 2022-09-24 ENCOUNTER — Ambulatory Visit (INDEPENDENT_AMBULATORY_CARE_PROVIDER_SITE_OTHER): Payer: Medicaid Other | Admitting: Physician Assistant

## 2022-09-24 VITALS — BP 146/88 | HR 76

## 2022-09-24 DIAGNOSIS — T8131XS Disruption of external operation (surgical) wound, not elsewhere classified, sequela: Secondary | ICD-10-CM

## 2022-09-24 DIAGNOSIS — T8131XD Disruption of external operation (surgical) wound, not elsewhere classified, subsequent encounter: Secondary | ICD-10-CM

## 2022-09-24 MED ORDER — OXYCODONE HCL 5 MG PO TABS: 5.0000 mg | ORAL_TABLET | Freq: Four times a day (QID) | ORAL | 0 refills | Status: AC | PRN

## 2022-09-24 NOTE — H&P (View-Only) (Signed)
   Patient ID: Gail Brewer, female    DOB: 02/10/1976, 47 y.o.   MRN: [id number]  Chief Complaint  Patient presents with   Pre-op Exam      ICD-10-CM   1. Dehiscence of surgical wound, sequela  T81.31XS        History of Present Illness: Gail Brewer is a 47 y.o.  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  She presents for preoperative evaluation for upcoming procedure, elevating skin flaps left breast for reapproximation and primary closure, scheduled for 10/03/2022 with Dr.  Taylor .  The patient has not had problems with anesthesia.  She has required supplemental oxygen postoperatively via [place].  Previous endometrioma excised without complications.  Patient denies any history of keloiding and does not take any prescription medications aside from over-the-counter vitamins.  NKDA.  No personal history of CVA, MI, use of blood thinners, or cancer.  No personal or family history of blood clots or clotting disorder.  Summary of Previous Visit: She was last seen here in office on 09/18/2022.  At that time, excellent appearing granular tissue measuring 9 x 8.5 cm, 0.1 cm depth.  She was evaluated by Dr. Taylor and given her improvement, discussed lifting/undermining the skin edges so that they can be better approximated.  This will help expedite her healing and allow her to proceed with second phase of her reconstruction.  Will discuss fat grafting or implant placement after she has recovered postoperatively.  She voices understanding and would like to proceed with this stage of surgery rather than allowing it to heal via secondary intent.  Job: Credit specialist, predominantly computer-based position.  She states that she may require additional FMLA.  Will discuss further at postoperative encounter.   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   Past Medical History: Allergies: Allergies  Allergen Reactions   Flagyl [Metronidazole] Other (See Comments)    Yeast infection    Current Medications:  Current Outpatient Medications:    ibuprofen (ADVIL) 800 MG tablet, Take 800 mg by mouth every 8 (eight) hours as needed (pain.)., Disp: , Rfl:    ketorolac (TORADOL) 10 MG tablet, Take 1 tablet (10 mg total) by mouth every 8 (eight) hours as needed for moderate pain. Please take as directed. Do not combine with ibuprofen, naproxen, or other NSAIDS., Disp: 30 tablet, Rfl: 0   oxyCODONE (ROXICODONE) 5 MG immediate release tablet, Take 1 tablet (5 mg total) by mouth every 6 (six) hours as needed for up to 5 days for severe pain., Disp: 20 tablet, Rfl: 0  Past Medical Problems: Past Medical History:  Diagnosis Date   Anemia    Anxiety    COVID 07/2021   mild   Depression    Heart murmur    asymptomatic    Past Surgical History: Past Surgical History:  Procedure Laterality Date   APPLICATION OF WOUND VAC Left 08/14/2022   Procedure: APPLICATION OF WOUND VAC;  Surgeon: Taylor, Jackson B, MD;  Location: [facility];  Service: Plastics;  Laterality: Left;   APPLICATION OF WOUND VAC Left 08/18/2022   Procedure: APPLICATION OF WOUND VAC;  Surgeon: Taylor, Jackson B, MD;  Location: [facility];  Service: Plastics;  Laterality: Left;   BREAST IMPLANT REMOVAL Left 07/22/2022   Procedure: DEBRIDEMENT LEFT BREAST;    Surgeon: Thimmappa, Brinda, MD;  Location: [facility];  Service: Plastics;  Laterality: Left;   BREAST REDUCTION SURGERY Bilateral 07/11/2022   Procedure: MAMMARY REDUCTION  (BREAST);  Surgeon: Thimmappa, Brinda, MD;  Location: [facility];  Service: Plastics;  Laterality: Bilateral;   CESAREAN SECTION     x 2   CESAREAN SECTION  07/24/2011   Procedure: CESAREAN SECTION;  Surgeon: Eleanor E Greene, MD;   Location: [facility];  Service: Gynecology;  Laterality: N/A;  Repeat Cesarian Section   IRRIGATION AND DEBRIDEMENT OF WOUND WITH SPLIT THICKNESS SKIN GRAFT Left 08/14/2022   Procedure: Washout and placement of wound vac;  Surgeon: Taylor, Jackson B, MD;  Location: [facility];  Service: Plastics;  Laterality: Left;   RESECTION OF ABDOMINAL MASS Right 11/20/2021   Procedure: EXCISION RIGHT LOWER ABDOMINAL WALL MASS;  Surgeon: Blackman, Douglas, MD;  Location: [facility];  Service: General;  Laterality: Right;    Social History: Social History   Socioeconomic History   Marital status: Divorced    Spouse name: Not on file   Number of children: Not on file   Years of education: Not on file   Highest education level: Not on file  Occupational History   Not on file  Tobacco Use   Smoking status: Former    Types: Cigars   Smokeless tobacco: Never  Vaping Use   Vaping Use: Never used  Substance and Sexual Activity   Alcohol use: Yes    Alcohol/week: 1.0 standard drink of alcohol    Types: 1 Standard drinks or equivalent per week   Drug use: No   Sexual activity: Not Currently    Birth control/protection: None  Other Topics Concern   Not on file  Social History Narrative   Not on file   Social Determinants of Health   Financial Resource Strain: Not on file  Food Insecurity: No Food Insecurity (08/18/2022)   Hunger Vital Sign    Worried About Running Out of Food in the Last Year: Never true    Ran Out of Food in the Last Year: Never true  Transportation Needs: No Transportation Needs (08/18/2022)   PRAPARE - Transportation    Lack of Transportation (Medical): No    Lack of Transportation (Non-Medical): No  Physical Activity: Not on file  Stress: Not on file  Social Connections: Not on file  Intimate Partner Violence: Not At Risk (08/18/2022)   Humiliation, Afraid, Rape, and Kick questionnaire    Fear of Current or Ex-Partner: No    Emotionally Abused: No    Physically  Abused: No    Sexually Abused: No    Family History: Family History  Problem Relation Age of Onset   Kidney failure Mother    Breast cancer Paternal Grandmother     Review of Systems: ROS Endorses some ongoing discomfort at wound site, but denies any redness, swelling, fevers, chest pain, difficulty breathing, leg swelling.  Physical Exam: Vital Signs BP (!) 146/88 (BP Location: Left Arm, Patient Position: Sitting, Cuff Size: Large)   Pulse 76   SpO2 99%   Physical Exam Constitutional:      General: Not in acute distress.    Appearance: Normal appearance. Not ill-appearing.  HENT:     Head: Normocephalic and atraumatic.  Eyes:     Pupils: Pupils are equal, round. Cardiovascular:     Rate and Rhythm: Normal rate.    Pulses: Normal pulses.  Pulmonary:     Effort: No respiratory   distress or increased work of breathing.  Speaks in full sentences. Abdominal:     General: Abdomen is flat. No distension.   Musculoskeletal: Normal range of motion. No lower extremity swelling or edema. No varicosities. Skin:    General: Skin is warm and dry.     Findings: No erythema or rash.  Neurological:     Mental Status: Alert and oriented to person, place, and time.  Psychiatric:        Mood and Affect: Mood normal.        Behavior: Behavior normal.    Assessment/Plan: The patient is scheduled for elevating skin flaps left breast for reapproximation and primary closure with Dr.  Taylor .  Risks, benefits, and alternatives of procedure discussed, questions answered and consent obtained.    Smoking Status: Non-smoker. Last Mammogram: 07/2021; Results: BI-RADS Category 1: Negative.   Caprini Score: 4; Risk Factors include: Age, BMI greater than 25, and length of planned surgery. Recommendation for mechanical prophylaxis. Encourage early ambulation  Pictures obtained: 09/18/2022.  Post-op Rx sent to pharmacy: Oxycodone.  She states that she already has Zofran.  She understands to hold  off on taking any oxycodone for pain management until day of surgery.  Patient was provided with the General Surgical Risk consent document and Pain Medication Agreement prior to their appointment.  They had adequate time to read through the risk consent documents and Pain Medication Agreement. We also discussed them in person together during this preop appointment. All of their questions were answered to their satisfaction.  Recommended calling if they have any further questions.  Risk consent form and Pain Medication Agreement to be scanned into patient's chart.   Electronically signed by: Naliah Eddington, PA-C 09/24/2022 2:39 PM 

## 2022-09-24 NOTE — Telephone Encounter (Signed)
Spoke with patient to schedule sx and pre/post ops

## 2022-09-24 NOTE — Progress Notes (Signed)
Patient ID: Gail Brewer, female    DOB: 05/12/1976, 47 y.o.   MRN: [id number]  Chief Complaint  Patient presents with   Pre-op Exam      ICD-10-CM   1. Dehiscence of surgical wound, sequela  T81.31XS        History of Present Illness: Gail Brewer is a 47 y.o.  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  She presents for preoperative evaluation for upcoming procedure, elevating skin flaps left breast for reapproximation and primary closure, scheduled for 10/03/2022 with Dr.  Lovena Le .  The patient has not had problems with anesthesia.  She has required supplemental oxygen postoperatively via [place].  Previous endometrioma excised without complications.  Patient denies any history of keloiding and does not take any prescription medications aside from over-the-counter vitamins.  NKDA.  No personal history of CVA, MI, use of blood thinners, or cancer.  No personal or family history of blood clots or clotting disorder.  Summary of Previous Visit: She was last seen here in office on 09/18/2022.  At that time, excellent appearing granular tissue measuring 9 x 8.5 cm, 0.1 cm depth.  She was evaluated by Dr. Lovena Le and given her improvement, discussed lifting/undermining the skin edges so that they can be better approximated.  This will help expedite her healing and allow her to proceed with second phase of her reconstruction.  Will discuss fat grafting or implant placement after she has recovered postoperatively.  She voices understanding and would like to proceed with this stage of surgery rather than allowing it to heal via secondary intent.  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  She states that she may require additional FMLA.  Will discuss further at postoperative encounter.   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   Past Medical History: Allergies: Allergies  Allergen Reactions   Flagyl [Metronidazole] Other (See Comments)    Yeast infection    Current Medications:  Current Outpatient Medications:    ibuprofen (ADVIL) 800 MG tablet, Take 800 mg by mouth every 8 (eight) hours as needed (pain.)., Disp: , Rfl:    ketorolac (TORADOL) 10 MG tablet, Take 1 tablet (10 mg total) by mouth every 8 (eight) hours as needed for moderate pain. Please take as directed. Do not combine with ibuprofen, naproxen, or other NSAIDS., Disp: 30 tablet, Rfl: 0   oxyCODONE (ROXICODONE) 5 MG immediate release tablet, Take 1 tablet (5 mg total) by mouth every 6 (six) hours as needed for up to 5 days for severe pain., Disp: 20 tablet, Rfl: 0  Past Medical Problems: Past Medical History:  Diagnosis Date   Anemia    Anxiety    COVID 07/2021   mild   Depression    Heart murmur    asymptomatic    Past Surgical History: Past Surgical History:  Procedure Laterality Date   APPLICATION OF WOUND VAC Left 08/14/2022   Procedure: APPLICATION OF WOUND VAC;  Surgeon: [name], MD;  Location: [place];  Service: Plastics;  Laterality: Left;   APPLICATION OF WOUND VAC Left 08/18/2022   Procedure: APPLICATION OF WOUND VAC;  Surgeon: [name], MD;  Location: [place];  Service: Plastics;  Laterality: Left;   BREAST IMPLANT REMOVAL Left 07/22/2022   Procedure: DEBRIDEMENT LEFT BREAST;  Surgeon: Irene Limbo, MD;  Location: [place] [facility];  Service: Plastics;  Laterality: Left;   BREAST REDUCTION SURGERY Bilateral 07/11/2022   Procedure: MAMMARY REDUCTION  (BREAST);  Surgeon: Irene Limbo, MD;  Location: [place];  Service: Plastics;  Laterality: Bilateral;   CESAREAN SECTION     x 2   CESAREAN SECTION  07/24/2011   Procedure: CESAREAN SECTION;  Surgeon: Avel Sensor, MD;   Location: [facility];  Service: Gynecology;  Laterality: N/A;  Repeat Cesarian Section   IRRIGATION AND DEBRIDEMENT OF WOUND WITH SPLIT THICKNESS SKIN GRAFT Left 08/14/2022   Procedure: Washout and placement of wound vac;  Surgeon: [name], MD;  Location: [place];  Service: Plastics;  Laterality: Left;   RESECTION OF ABDOMINAL MASS Right 11/20/2021   Procedure: EXCISION RIGHT LOWER ABDOMINAL WALL MASS;  Surgeon: Coralie Keens, MD;  Location: [place];  Service: General;  Laterality: Right;    Social History: Social History   Socioeconomic History   Marital status: Divorced    Spouse name: Not on file   Number of children: Not on file   Years of education: Not on file   Highest education level: Not on file  Occupational History   Not on file  Tobacco Use   Smoking status: Former    Types: Cigars   Smokeless tobacco: Never  Vaping Use   Vaping Use: Never used  Substance and Sexual Activity   Alcohol use: Yes    Alcohol/week: 1.0 standard drink of alcohol    Types: 1 Standard drinks or equivalent per week   Drug use: No   Sexual activity: Not Currently    Birth control/protection: None  Other Topics Concern   Not on file  Social History Narrative   Not on file   Social Determinants of Health   Financial Resource Strain: Not on file  Food Insecurity: No Food Insecurity (08/18/2022)   Hunger Vital Sign    Worried About Running Out of Food in the Last Year: Never true    Ran Out of Food in the Last Year: Never true  Transportation Needs: No Transportation Needs (08/18/2022)   PRAPARE - Hydrologist (Medical): No    Lack of Transportation (Non-Medical): No  Physical Activity: Not on file  Stress: Not on file  Social Connections: Not on file  Intimate Partner Violence: Not At Risk (08/18/2022)   Humiliation, Afraid, Rape, and Kick questionnaire    Fear of Current or Ex-Partner: No    Emotionally Abused: No    Physically  Abused: No    Sexually Abused: No    Family History: Family History  Problem Relation Age of Onset   Kidney failure Mother    Breast cancer Paternal Grandmother     Review of Systems: ROS Endorses some ongoing discomfort at wound site, but denies any redness, swelling, fevers, chest pain, difficulty breathing, leg swelling.  Physical Exam: Vital Signs BP (!) 146/88 (BP Location: Left Arm, Patient Position: Sitting, Cuff Size: Large)   Pulse 76   SpO2 99%   Physical Exam Constitutional:      General: Not in acute distress.    Appearance: Normal appearance. Not ill-appearing.  HENT:     Head: Normocephalic and atraumatic.  Eyes:     Pupils: Pupils are equal, round. Cardiovascular:     Rate and Rhythm: Normal rate.    Pulses: Normal pulses.  Pulmonary:     Effort: No respiratory  distress or increased work of breathing.  Speaks in full sentences. Abdominal:     General: Abdomen is flat. No distension.   Musculoskeletal: Normal range of motion. No lower extremity swelling or edema. No varicosities. Skin:    General: Skin is warm and dry.     Findings: No erythema or rash.  Neurological:     Mental Status: Alert and oriented to person, place, and time.  Psychiatric:        Mood and Affect: Mood normal.        Behavior: Behavior normal.    Assessment/Plan: The patient is scheduled for elevating skin flaps left breast for reapproximation and primary closure with Dr.  Lovena Le .  Risks, benefits, and alternatives of procedure discussed, questions answered and consent obtained.    Smoking Status: Non-smoker. Last Mammogram: 07/2021; Results: BI-RADS Category 1: Negative.   Caprini Score: 4; Risk Factors include: Age, BMI greater than 25, and length of planned surgery. Recommendation for mechanical prophylaxis. Encourage early ambulation  Pictures obtained: 09/18/2022.  Post-op Rx sent to pharmacy: Oxycodone.  She states that she already has Zofran.  She understands to hold  off on taking any oxycodone for pain management until day of surgery.  Patient was provided with the General Surgical Risk consent document and Pain Medication Agreement prior to their appointment.  They had adequate time to read through the risk consent documents and Pain Medication Agreement. We also discussed them in person together during this preop appointment. All of their questions were answered to their satisfaction.  Recommended calling if they have any further questions.  Risk consent form and Pain Medication Agreement to be scanned into patient's chart.   Electronically signed by: Krista Blue, PA-C 09/24/2022 2:39 PM

## 2022-09-26 ENCOUNTER — Other Ambulatory Visit: Payer: Self-pay

## 2022-09-26 ENCOUNTER — Encounter (HOSPITAL_BASED_OUTPATIENT_CLINIC_OR_DEPARTMENT_OTHER): Payer: Self-pay | Admitting: Plastic Surgery

## 2022-10-03 ENCOUNTER — Ambulatory Visit (HOSPITAL_BASED_OUTPATIENT_CLINIC_OR_DEPARTMENT_OTHER)
Admission: RE | Admit: 2022-10-03 | Discharge: 2022-10-03 | Disposition: A | Discharge status: Home / Self Care | Payer: Medicaid Other | Attending: Plastic Surgery | Admitting: Plastic Surgery

## 2022-10-03 ENCOUNTER — Encounter (HOSPITAL_BASED_OUTPATIENT_CLINIC_OR_DEPARTMENT_OTHER)
Admission: RE | Disposition: A | Discharge status: Home / Self Care | Payer: Self-pay | Source: Home / Self Care | Attending: Plastic Surgery

## 2022-10-03 ENCOUNTER — Other Ambulatory Visit: Payer: Self-pay

## 2022-10-03 ENCOUNTER — Encounter (HOSPITAL_BASED_OUTPATIENT_CLINIC_OR_DEPARTMENT_OTHER): Payer: Self-pay | Admitting: Plastic Surgery

## 2022-10-03 ENCOUNTER — Ambulatory Visit (HOSPITAL_BASED_OUTPATIENT_CLINIC_OR_DEPARTMENT_OTHER): Payer: Medicaid Other | Admitting: Anesthesiology

## 2022-10-03 DIAGNOSIS — Z87891 Personal history of nicotine dependence: Secondary | ICD-10-CM | POA: Diagnosis not present

## 2022-10-03 DIAGNOSIS — Z6835 Body mass index (BMI) 35.0-35.9, adult: Secondary | ICD-10-CM | POA: Diagnosis not present

## 2022-10-03 DIAGNOSIS — E669 Obesity, unspecified: Secondary | ICD-10-CM | POA: Insufficient documentation

## 2022-10-03 DIAGNOSIS — T8131XS Disruption of external operation (surgical) wound, not elsewhere classified, sequela: Secondary | ICD-10-CM | POA: Diagnosis present

## 2022-10-03 DIAGNOSIS — T8131XD Disruption of external operation (surgical) wound, not elsewhere classified, subsequent encounter: Secondary | ICD-10-CM

## 2022-10-03 DIAGNOSIS — S21002A Unspecified open wound of left breast, initial encounter: Secondary | ICD-10-CM

## 2022-10-03 DIAGNOSIS — Z9889 Other specified postprocedural states: Secondary | ICD-10-CM

## 2022-10-03 DIAGNOSIS — X58XXXS Exposure to other specified factors, sequela: Secondary | ICD-10-CM | POA: Insufficient documentation

## 2022-10-03 DIAGNOSIS — Z01818 Encounter for other preprocedural examination: Secondary | ICD-10-CM

## 2022-10-03 HISTORY — PX: PRIMARY CLOSURE: SHX6224

## 2022-10-03 LAB — POCT PREGNANCY, URINE: Preg Test, Ur: NEGATIVE

## 2022-10-03 SURGERY — PRIMARY CLOSURE
Anesthesia: General | Site: Breast | Laterality: Left

## 2022-10-03 MED ORDER — CEFAZOLIN SODIUM-DEXTROSE 2-4 GM/100ML-% IV SOLN
2.0000 g | INTRAVENOUS | Status: AC
  Administered 2022-10-03: 2 g via INTRAVENOUS

## 2022-10-03 MED ORDER — DIPHENHYDRAMINE HCL 25 MG PO CAPS: 25.0000 mg | ORAL_CAPSULE | Freq: Four times a day (QID) | ORAL | Status: DC | PRN

## 2022-10-03 MED ORDER — KETOROLAC TROMETHAMINE 30 MG/ML IJ SOLN
INTRAMUSCULAR | Status: AC
  Filled 2022-10-03: qty 1

## 2022-10-03 MED ORDER — MORPHINE SULFATE (PF) 4 MG/ML IV SOLN: 4.0000 mg | INTRAVENOUS | Status: DC | PRN

## 2022-10-03 MED ORDER — FENTANYL CITRATE (PF) 100 MCG/2ML IJ SOLN: 25.0000 ug | INTRAMUSCULAR | Status: DC | PRN

## 2022-10-03 MED ORDER — SUCCINYLCHOLINE CHLORIDE 200 MG/10ML IV SOSY
PREFILLED_SYRINGE | INTRAVENOUS | Status: AC
  Filled 2022-10-03: qty 10

## 2022-10-03 MED ORDER — LIDOCAINE HCL (CARDIAC) PF 100 MG/5ML IV SOSY
PREFILLED_SYRINGE | INTRAVENOUS | Status: DC | PRN
  Administered 2022-10-03: 60 mg via INTRAVENOUS

## 2022-10-03 MED ORDER — MIDAZOLAM HCL 2 MG/2ML IJ SOLN
INTRAMUSCULAR | Status: AC
  Filled 2022-10-03: qty 2

## 2022-10-03 MED ORDER — LIDOCAINE 2% (20 MG/ML) 5 ML SYRINGE
INTRAMUSCULAR | Status: AC
  Filled 2022-10-03: qty 5

## 2022-10-03 MED ORDER — ACETAMINOPHEN 10 MG/ML IV SOLN
INTRAVENOUS | Status: DC | PRN
  Administered 2022-10-03: 1000 mg via INTRAVENOUS

## 2022-10-03 MED ORDER — KETOROLAC TROMETHAMINE 30 MG/ML IJ SOLN
INTRAMUSCULAR | Status: DC | PRN
  Administered 2022-10-03: 30 mg via INTRAVENOUS

## 2022-10-03 MED ORDER — OXYCODONE HCL 5 MG PO TABS: 5.0000 mg | ORAL_TABLET | ORAL | Status: DC | PRN

## 2022-10-03 MED ORDER — DEXAMETHASONE SODIUM PHOSPHATE 10 MG/ML IJ SOLN
INTRAMUSCULAR | Status: AC
  Filled 2022-10-03: qty 1

## 2022-10-03 MED ORDER — PROMETHAZINE HCL 25 MG/ML IJ SOLN: 6.2500 mg | INTRAMUSCULAR | Status: DC | PRN

## 2022-10-03 MED ORDER — DIPHENHYDRAMINE HCL 50 MG/ML IJ SOLN: 25.0000 mg | Freq: Four times a day (QID) | INTRAMUSCULAR | Status: DC | PRN

## 2022-10-03 MED ORDER — OXYCODONE HCL 5 MG PO TABS
ORAL_TABLET | ORAL | Status: AC
  Filled 2022-10-03: qty 1

## 2022-10-03 MED ORDER — CHLORHEXIDINE GLUCONATE CLOTH 2 % EX PADS: 6.0000 | MEDICATED_PAD | Freq: Once | CUTANEOUS | Status: DC

## 2022-10-03 MED ORDER — LACTATED RINGERS IV SOLN: INTRAVENOUS | Status: DC

## 2022-10-03 MED ORDER — PROPOFOL 500 MG/50ML IV EMUL
INTRAVENOUS | Status: DC | PRN
  Administered 2022-10-03: 200 mg via INTRAVENOUS

## 2022-10-03 MED ORDER — DEXAMETHASONE SODIUM PHOSPHATE 10 MG/ML IJ SOLN
INTRAMUSCULAR | Status: DC | PRN
  Administered 2022-10-03: 5 mg via INTRAVENOUS

## 2022-10-03 MED ORDER — FENTANYL CITRATE (PF) 100 MCG/2ML IJ SOLN
INTRAMUSCULAR | Status: AC
  Filled 2022-10-03: qty 2

## 2022-10-03 MED ORDER — CEFAZOLIN SODIUM-DEXTROSE 2-4 GM/100ML-% IV SOLN
INTRAVENOUS | Status: AC
  Filled 2022-10-03: qty 100

## 2022-10-03 MED ORDER — ONDANSETRON HCL 4 MG/2ML IJ SOLN
INTRAMUSCULAR | Status: DC | PRN
  Administered 2022-10-03: 4 mg via INTRAVENOUS

## 2022-10-03 MED ORDER — OXYCODONE HCL 5 MG PO TABS
5.0000 mg | ORAL_TABLET | Freq: Once | ORAL | Status: AC
  Administered 2022-10-03: 5 mg via ORAL

## 2022-10-03 MED ORDER — OXYCODONE HCL 5 MG PO TABS: 5.0000 mg | ORAL_TABLET | Freq: Three times a day (TID) | ORAL | 0 refills | Status: AC | PRN

## 2022-10-03 MED ORDER — ACETAMINOPHEN 10 MG/ML IV SOLN
INTRAVENOUS | Status: AC
  Filled 2022-10-03: qty 100

## 2022-10-03 MED ORDER — ONDANSETRON HCL 4 MG/2ML IJ SOLN: 4.0000 mg | Freq: Four times a day (QID) | INTRAMUSCULAR | Status: DC | PRN

## 2022-10-03 MED ORDER — MIDAZOLAM HCL 5 MG/5ML IJ SOLN
INTRAMUSCULAR | Status: DC | PRN
  Administered 2022-10-03: 2 mg via INTRAVENOUS

## 2022-10-03 MED ORDER — EPHEDRINE 5 MG/ML INJ
INTRAVENOUS | Status: AC
  Filled 2022-10-03: qty 5

## 2022-10-03 MED ORDER — PROPOFOL 10 MG/ML IV BOLUS
INTRAVENOUS | Status: DC | PRN
  Administered 2022-10-03: 200 ug/kg/min via INTRAVENOUS

## 2022-10-03 MED ORDER — ONDANSETRON HCL 4 MG/2ML IJ SOLN
INTRAMUSCULAR | Status: AC
  Filled 2022-10-03: qty 2

## 2022-10-03 MED ORDER — PHENYLEPHRINE 80 MCG/ML (10ML) SYRINGE FOR IV PUSH (FOR BLOOD PRESSURE SUPPORT)
PREFILLED_SYRINGE | INTRAVENOUS | Status: AC
  Filled 2022-10-03: qty 10

## 2022-10-03 MED ORDER — ONDANSETRON 4 MG PO TBDP: 4.0000 mg | ORAL_TABLET | Freq: Four times a day (QID) | ORAL | Status: DC | PRN

## 2022-10-03 MED ORDER — POLYETHYLENE GLYCOL 3350 17 G PO PACK: 17.0000 g | PACK | Freq: Every day | ORAL | Status: DC | PRN

## 2022-10-03 MED ORDER — FENTANYL CITRATE (PF) 100 MCG/2ML IJ SOLN
INTRAMUSCULAR | Status: DC | PRN
  Administered 2022-10-03: 50 ug via INTRAVENOUS
  Administered 2022-10-03: 100 ug via INTRAVENOUS

## 2022-10-03 SURGICAL SUPPLY — 71 items
ADH SKN CLS APL DERMABOND .7 (GAUZE/BANDAGES/DRESSINGS) ×2
APL PRP STRL LF DISP 70% ISPRP (MISCELLANEOUS) ×1
APL SKNCLS STERI-STRIP NONHPOA (GAUZE/BANDAGES/DRESSINGS) ×1
BAG DECANTER FOR FLEXI CONT (MISCELLANEOUS) IMPLANT
BENZOIN TINCTURE PRP APPL 2/3 (GAUZE/BANDAGES/DRESSINGS) ×1 IMPLANT
BIOPATCH RED 1 DISK 7.0 (GAUZE/BANDAGES/DRESSINGS) IMPLANT
BLADE HEX COATED 2.75 (ELECTRODE) IMPLANT
BLADE SURG 10 STRL SS (BLADE) IMPLANT
BLADE SURG 15 STRL LF DISP TIS (BLADE) ×2 IMPLANT
BLADE SURG 15 STRL SS (BLADE) ×2
BNDG CMPR MED 10X6 ELC LF (GAUZE/BANDAGES/DRESSINGS) ×1
BNDG ELASTIC 6X10 VLCR STRL LF (GAUZE/BANDAGES/DRESSINGS) ×1 IMPLANT
CANISTER SUCT 1200ML W/VALVE (MISCELLANEOUS) ×1 IMPLANT
CHLORAPREP W/TINT 26 (MISCELLANEOUS) ×1 IMPLANT
COVER BACK TABLE 60X90IN (DRAPES) ×1 IMPLANT
COVER MAYO STAND STRL (DRAPES) ×1 IMPLANT
DERMABOND ADVANCED .7 DNX12 (GAUZE/BANDAGES/DRESSINGS) IMPLANT
DRAIN CHANNEL 15F RND FF W/TCR (WOUND CARE) IMPLANT
DRAIN CHANNEL 19F RND (DRAIN) IMPLANT
DRAPE LAPAROSCOPIC ABDOMINAL (DRAPES) IMPLANT
DRAPE UTILITY XL STRL (DRAPES) ×1 IMPLANT
DRESSING MEPILEX FLEX 4X4 (GAUZE/BANDAGES/DRESSINGS) IMPLANT
DRSG EMULSION OIL 3X3 NADH (GAUZE/BANDAGES/DRESSINGS) IMPLANT
DRSG MEPILEX FLEX 4X4 (GAUZE/BANDAGES/DRESSINGS) ×1
DRSG MEPILEX POST OP 4X8 (GAUZE/BANDAGES/DRESSINGS) IMPLANT
DRSG TEGADERM 4X4.75 (GAUZE/BANDAGES/DRESSINGS) IMPLANT
ELECT BLADE 4.0 EZ CLEAN MEGAD (MISCELLANEOUS)
ELECT BLADE 6.5 EXT (BLADE) IMPLANT
ELECT REM PT RETURN 9FT ADLT (ELECTROSURGICAL) ×1
ELECTRODE BLDE 4.0 EZ CLN MEGD (MISCELLANEOUS) IMPLANT
ELECTRODE REM PT RTRN 9FT ADLT (ELECTROSURGICAL) ×1 IMPLANT
EVACUATOR SILICONE 100CC (DRAIN) IMPLANT
GAUZE PAD ABD 8X10 STRL (GAUZE/BANDAGES/DRESSINGS) ×2 IMPLANT
GLOVE BIO SURGEON STRL SZ8 (GLOVE) ×1 IMPLANT
GLOVE BIOGEL M STRL SZ7.5 (GLOVE) ×1 IMPLANT
GLOVE BIOGEL PI IND STRL 7.5 (GLOVE) ×1 IMPLANT
GLOVE SURG SS PI 7.5 STRL IVOR (GLOVE) IMPLANT
GOWN STRL REUS W/ TWL LRG LVL3 (GOWN DISPOSABLE) ×2 IMPLANT
GOWN STRL REUS W/TWL LRG LVL3 (GOWN DISPOSABLE) ×2
HEMOSTAT ARISTA ABSORB 3G PWDR (HEMOSTASIS) IMPLANT
NDL SPNL 18GX3.5 QUINCKE PK (NEEDLE) IMPLANT
NEEDLE SPNL 18GX3.5 QUINCKE PK (NEEDLE) IMPLANT
NS IRRIG 1000ML POUR BTL (IV SOLUTION) ×1 IMPLANT
PACK BASIN DAY SURGERY FS (CUSTOM PROCEDURE TRAY) ×1 IMPLANT
PACK UNIVERSAL I (CUSTOM PROCEDURE TRAY) ×1 IMPLANT
PENCIL SMOKE EVACUATOR (MISCELLANEOUS) ×1 IMPLANT
PIN SAFETY STERILE (MISCELLANEOUS) IMPLANT
SLEEVE SCD COMPRESS KNEE MED (STOCKING) ×1 IMPLANT
SPIKE FLUID TRANSFER (MISCELLANEOUS) IMPLANT
SPONGE T-LAP 18X18 ~~LOC~~+RFID (SPONGE) ×2 IMPLANT
STAPLER INSORB 30 2030 C-SECTI (MISCELLANEOUS) ×1 IMPLANT
STAPLER VISISTAT 35W (STAPLE) IMPLANT
STRIP CLOSURE SKIN 1/2X4 (GAUZE/BANDAGES/DRESSINGS) IMPLANT
SUT MNCRL AB 3-0 PS2 27 (SUTURE) IMPLANT
SUT MNCRL AB 4-0 PS2 18 (SUTURE) IMPLANT
SUT MON AB 2-0 CT1 36 (SUTURE) IMPLANT
SUT MON AB 3-0 SH 27 (SUTURE)
SUT MON AB 3-0 SH27 (SUTURE) IMPLANT
SUT MON AB 5-0 PS2 18 (SUTURE) IMPLANT
SUT SILK 2 0 SH (SUTURE) IMPLANT
SUT VIC AB 3-0 SH 27 (SUTURE)
SUT VIC AB 3-0 SH 27X BRD (SUTURE) IMPLANT
SUT VICRYL 4-0 PS2 18IN ABS (SUTURE) IMPLANT
SUT VICRYL AB 3 0 TIES (SUTURE) IMPLANT
SYR 50ML LL SCALE MARK (SYRINGE) IMPLANT
SYR BULB IRRIG 60ML STRL (SYRINGE) ×1 IMPLANT
SYR CONTROL 10ML LL (SYRINGE) ×1 IMPLANT
TOWEL GREEN STERILE FF (TOWEL DISPOSABLE) ×1 IMPLANT
TUBE CONNECTING 20X1/4 (TUBING) ×1 IMPLANT
UNDERPAD 30X36 HEAVY ABSORB (UNDERPADS AND DIAPERS) ×2 IMPLANT
YANKAUER SUCT BULB TIP NO VENT (SUCTIONS) ×1 IMPLANT

## 2022-10-03 NOTE — Interval H&P Note (Signed)
History and Physical Interval Note: Pt met in the pre op area, no change in physical exam or indication for surgery. Surgical site marked with her concurrence. Will proceed with breast wound revision at her request  10/03/2022 11:14 AM  Gail Brewer  has presented today for surgery, with the diagnosis of S/P bilateral breast reduction.  The various methods of treatment have been discussed with the patient and family. After consideration of risks, benefits and other options for treatment, the patient has consented to  Procedure(s): lifting skin flaps on left breast for reapproximation and primary closure (Left) as a surgical intervention.  The patient's history has been reviewed, patient examined, no change in status, stable for surgery.  I have reviewed the patient's chart and labs.  Questions were answered to the patient's satisfaction.     Gail Brewer

## 2022-10-03 NOTE — Anesthesia Procedure Notes (Signed)
Procedure Name: LMA Insertion Date/Time: 10/03/2022 11:46 AM  Performed by: Willa Frater, CRNAPre-anesthesia Checklist: Patient identified, Emergency Drugs available, Suction available and Patient being monitored Patient Re-evaluated:Patient Re-evaluated prior to induction Oxygen Delivery Method: Circle system utilized Preoxygenation: Pre-oxygenation with 100% oxygen Induction Type: IV induction Ventilation: Mask ventilation without difficulty LMA: LMA inserted LMA Size: 4.0 Number of attempts: 1 Airway Equipment and Method: Bite block Placement Confirmation: positive ETCO2 Tube secured with: Tape Dental Injury: Teeth and Oropharynx as per pre-operative assessment

## 2022-10-03 NOTE — Op Note (Signed)
DATE OF OPERATION: 10/03/2022  LOCATION: [facility] operating Room  PREOPERATIVE DIAGNOSIS: Left breast open wound  POSTOPERATIVE DIAGNOSIS: Same  PROCEDURE: Elevation of skin flaps, local tissue rearrangement, closure of wound  SURGEON: Jeanann Lewandowsky, MD  ASSISTANT: Krista Blue  EBL: 10 cc  CONDITION: Stable  COMPLICATIONS: None  INDICATION: The patient, Gail Brewer, is a 47 y.o. female born on [birth year], is here for treatment of an open wound she sustained after ischemia secondary to a breast reduction.  Patient lost the nipple and central portion of her pedicle.  This was debrided approximately a month ago and the wound was left open to begin granulating.  The wound was clean but was not decreasing in size so I elected to take the patient to the operating room and elevate the skin flaps to close over the top of the pedicle.Marland Kitchen   PROCEDURE DETAILS:  The patient was seen prior to surgery and marked.  IV antibiotics were given. The patient was taken to the operating room and given a general anesthetic. A standard time out was performed and all information was confirmed by those in the room. SCDs were placed.   The left breast was prepped and draped in usual sterile manner.  The open area and exposed pedicle was prepared by removing the top layer of granulation tissue.  This area measured approximately 9 x 9 cm.  The medial and lateral skin flaps were then mobilized with the electrocautery.  I elevated approximately 7 cm on each side to allow closure of the wound.  After elevation of the skin flaps meticulous hemostasis was achieved with the electrocautery and the wound was irrigated with saline.  Arista hemostatic agent was placed on the exposed subcutaneous tissue.  The skin edges were excised to provide from fresh tissue for closure.  The dermis was approximated with interrupted 3-0 Monocryl sutures.  The skin edges were closed with a running 4-0 Monocryl subcuticular stitch.  There  was a 1 cm x 1 cm portion of the wound that would not close due to tension and this was left open to granulate.  Prior to closing the skin edges a 15 French round drain was placed in the wound and brought out through a separate stab incision.  Dressings were applied.  All instrument needle and sponge counts were reported as correct there were no complications. The patient was allowed to wake up and taken to recovery room in stable condition at the end of the case. The family was notified at the end of the case.   The advanced practice practitioner (APP) assisted throughout the case.  The APP was essential in retraction and counter traction when needed to make the case progress smoothly.  This retraction and assistance made it possible to see the tissue plans for the procedure.  The assistance was needed for blood control, tissue re-approximation and assisted with closure of the incision site.

## 2022-10-03 NOTE — Discharge Instructions (Addendum)
Activity: Avoid strenuous activity.  No lifting, pushing, or pulling greater than 15 pounds.  Diet: No restrictions.  Try to optimize nutrition with plenty of proteins, fruits, and vegetables to improve healing.   Wound Care: The wound was closed almost in its entirety. A small, 1 x 1 cm wound remains where the vertical incision meets the inframammary incision.  Please leave the bandages in place until you are seen for follow-up next week.  We will want to see you Wednesday or Thursday next week.    If you have drains placed, be sure to record the daily output from each side. They will be removed at your appointment next week. Please make sure that the bulbs are charged.  If you experience any issues, please call the office.    Follow-Up: Next week.  Things to watch for:  Call the office if you experience fever, chills, intractable vomiting, or significant bleeding.  Mild wound drainage is common after breast reduction surgery and should not be cause for alarm.   Post Anesthesia Home Care Instructions  Activity: Get plenty of rest for the remainder of the day. A responsible individual must stay with you for 24 hours following the procedure.  For the next 24 hours, DO NOT: -Drive a car -Paediatric nurse -Drink alcoholic beverages -Take any medication unless instructed by your physician -Make any legal decisions or sign important papers.  Meals: Start with liquid foods such as gelatin or soup. Progress to regular foods as tolerated. Avoid greasy, spicy, heavy foods. If nausea and/or vomiting occur, drink only clear liquids until the nausea and/or vomiting subsides. Call your physician if vomiting continues.  Special Instructions/Symptoms: Your throat may feel dry or sore from the anesthesia or the breathing tube placed in your throat during surgery. If this causes discomfort, gargle with warm salt water. The discomfort should disappear within 24 hours.  If you had a scopolamine patch  placed behind your ear for the management of post- operative nausea and/or vomiting:  1. The medication in the patch is effective for 72 hours, after which it should be removed.  Wrap patch in a tissue and discard in the trash. Wash hands thoroughly with soap and water. 2. You may remove the patch earlier than 72 hours if you experience unpleasant side effects which may include dry mouth, dizziness or visual disturbances. 3. Avoid touching the patch. Wash your hands with soap and water after contact with the patch.

## 2022-10-03 NOTE — Anesthesia Preprocedure Evaluation (Signed)
Anesthesia Evaluation  Patient identified by MRN, date of birth, ID band Patient awake    Reviewed: Allergy & Precautions, NPO status , Patient's Chart, lab work & pertinent test results  Airway Mallampati: II  TM Distance: >3 FB Neck ROM: Full    Dental  (+) Teeth Intact, Dental Advisory Given   Pulmonary former smoker   Pulmonary exam normal breath sounds clear to auscultation       Cardiovascular negative cardio ROS Normal cardiovascular exam Rhythm:Regular Rate:Normal     Neuro/Psych  PSYCHIATRIC DISORDERS Anxiety Depression    negative neurological ROS     GI/Hepatic negative GI ROS, Neg liver ROS,,,  Endo/Other  Obesity   Renal/GU negative Renal ROS     Musculoskeletal negative musculoskeletal ROS (+)    Abdominal   Peds  Hematology  (+) Blood dyscrasia, anemia   Anesthesia Other Findings Day of surgery medications reviewed with the patient.  Reproductive/Obstetrics                              Anesthesia Physical Anesthesia Plan  ASA: 2  Anesthesia Plan: General   Post-op Pain Management: Tylenol PO (pre-op)* and Toradol IV (intra-op)*   Induction: Intravenous  PONV Risk Score and Plan: 3 and Midazolam, Dexamethasone, Ondansetron and TIVA  Airway Management Planned: LMA  Additional Equipment:   Intra-op Plan:   Post-operative Plan: Extubation in OR  Informed Consent: I have reviewed the patients History and Physical, chart, labs and discussed the procedure including the risks, benefits and alternatives for the proposed anesthesia with the patient or authorized representative who has indicated his/her understanding and acceptance.     Dental advisory given  Plan Discussed with: CRNA  Anesthesia Plan Comments:          Anesthesia Quick Evaluation

## 2022-10-03 NOTE — Anesthesia Postprocedure Evaluation (Signed)
Anesthesia Post Note  Patient: Gail Brewer  Procedure(s) Performed: lifting skin flaps on left breast for reapproximation and primary closure (Left: Breast)     Patient location during evaluation: PACU Anesthesia Type: General Level of consciousness: awake and alert Pain management: pain level controlled Vital Signs Assessment: post-procedure vital signs reviewed and stable Respiratory status: spontaneous breathing, nonlabored ventilation, respiratory function stable and patient connected to nasal cannula oxygen Cardiovascular status: blood pressure returned to baseline and stable Postop Assessment: no apparent nausea or vomiting Anesthetic complications: no   No notable events documented.  Last Vitals:  Vitals:   10/03/22 1330 10/03/22 1345  BP: 121/82 119/77  Pulse: 81 75  Resp: 18 13  Temp:    SpO2: 97% 97%    Last Pain:  Vitals:   10/03/22 1345  TempSrc:   PainSc: [place]

## 2022-10-03 NOTE — Transfer of Care (Signed)
Immediate Anesthesia Transfer of Care Note  Patient: AVONDA LUX  Procedure(s) Performed: lifting skin flaps on left breast for reapproximation and primary closure (Left: Breast)  Patient Location: PACU  Anesthesia Type:General  Level of Consciousness: awake, alert , oriented, drowsy, and patient cooperative  Airway & Oxygen Therapy: Patient Spontanous Breathing and Patient connected to face mask oxygen  Post-op Assessment: Report given to RN and Post -op Vital signs reviewed and stable  Post vital signs: Reviewed and stable  Last Vitals:  Vitals Value Taken Time  BP    Temp    Pulse 82 10/03/22 1325  Resp    SpO2 96 % 10/03/22 1325  Vitals shown include unvalidated device data.  Last Pain:  Vitals:   10/03/22 0923  TempSrc: Oral  PainSc: 9       Patients Stated Pain Goal: 5 ([id number])  Complications: No notable events documented.

## 2022-10-06 ENCOUNTER — Encounter (HOSPITAL_BASED_OUTPATIENT_CLINIC_OR_DEPARTMENT_OTHER): Payer: Self-pay | Admitting: Plastic Surgery

## 2022-10-09 ENCOUNTER — Ambulatory Visit (INDEPENDENT_AMBULATORY_CARE_PROVIDER_SITE_OTHER): Payer: Medicaid Other | Admitting: Plastic Surgery

## 2022-10-09 ENCOUNTER — Encounter: Payer: Self-pay | Admitting: Plastic Surgery

## 2022-10-09 DIAGNOSIS — T8131XS Disruption of external operation (surgical) wound, not elsewhere classified, sequela: Secondary | ICD-10-CM

## 2022-10-09 DIAGNOSIS — Z9889 Other specified postprocedural states: Secondary | ICD-10-CM

## 2022-10-09 NOTE — Progress Notes (Signed)
Ms. Zahradka returns today after revision and closure of the left breast.  She still has an area approximately 1 x 1 cm at the T-junction that is being allowed to granulate in.  The remainder of the vertical incision is closed and well-approximated.  She reports that she has only had 5 to 10 cc out of her drain over the past 48 hours.  On physical examination the breast is soft nonerythematous the incision is clean dry and intact.  The area at the T-junction that is granulating is pink and healthy.  The drain is removed today without difficulty.  The patient is instructed to begin washing the area with soap and water as tolerated.  She will keep a dressing with Aquaphor or Vaseline over the granulating tissue.  She will begin scar massage at 2 weeks postoperative.  We discussed briefly the expected timeframe for any additional procedures.  She has a follow-up appointment within the next 2 weeks and will have a second follow-up appointment approximately 4 weeks after that.  Once the area at the T-junction is completely healed we will plan for either implant placement or fat grafting to increase the volume on the left side approximately 6 months later.  She understands and agrees.

## 2022-10-10 ENCOUNTER — Telehealth (INDEPENDENT_AMBULATORY_CARE_PROVIDER_SITE_OTHER): Payer: Medicaid Other | Admitting: Physician Assistant

## 2022-10-10 ENCOUNTER — Telehealth: Payer: Self-pay | Admitting: Plastic Surgery

## 2022-10-10 DIAGNOSIS — Z9889 Other specified postprocedural states: Secondary | ICD-10-CM

## 2022-10-10 MED ORDER — MELOXICAM 7.5 MG PO TABS: 7.5000 mg | ORAL_TABLET | Freq: Two times a day (BID) | ORAL | 0 refills | Status: DC

## 2022-10-10 NOTE — Telephone Encounter (Signed)
See recent telephone encounter.  Patient is s/p primary closure in the OR after recovering from wound VAC placement for nipple necrosis secondary to bilateral breast reduction performed 07/11/2022. Prescribing Mobic to be taken in conjunction with Tylenol for pain control. Advised patient not to combine with other NSAIDs.

## 2022-10-10 NOTE — Telephone Encounter (Signed)
I called and spoke with patient.  I told her that I was not comfortable prescribing her any further narcotic medications.  She has received 172 pain pills in the past few months and I emphasized the importance of transitioning to over-the-counter medications for analgesia. Spoke with Dr. Lovena Le who told me that yesterday she had reported that her pain was dramatically improved since surgery and her exam was benign.   Will prescribe Mobic twice daily to take in conjunction with Tylenol.  Informed her that she cannot combine the Mobic with other NSAIDs. If she is still having intractable pain at her next post-operative visit, will consider referral to pain management.

## 2022-10-10 NOTE — Telephone Encounter (Signed)
Pt called this morning and asked for pain medication to be refilled.  Ibuprofen is not helping.

## 2022-10-14 ENCOUNTER — Other Ambulatory Visit: Payer: Self-pay

## 2022-10-14 ENCOUNTER — Emergency Department (HOSPITAL_COMMUNITY)
Admission: EM | Admit: 2022-10-14 | Discharge: 2022-10-14 | Disposition: A | Discharge status: Home / Self Care | Payer: No Typology Code available for payment source | Attending: Emergency Medicine | Admitting: Emergency Medicine

## 2022-10-14 ENCOUNTER — Encounter (HOSPITAL_COMMUNITY): Payer: Self-pay | Admitting: Emergency Medicine

## 2022-10-14 DIAGNOSIS — R519 Headache, unspecified: Secondary | ICD-10-CM | POA: Diagnosis present

## 2022-10-14 DIAGNOSIS — M62838 Other muscle spasm: Secondary | ICD-10-CM | POA: Diagnosis not present

## 2022-10-14 DIAGNOSIS — M62830 Muscle spasm of back: Secondary | ICD-10-CM | POA: Insufficient documentation

## 2022-10-14 DIAGNOSIS — Y92410 Unspecified street and highway as the place of occurrence of the external cause: Secondary | ICD-10-CM | POA: Insufficient documentation

## 2022-10-14 DIAGNOSIS — S00211A Abrasion of right eyelid and periocular area, initial encounter: Secondary | ICD-10-CM | POA: Insufficient documentation

## 2022-10-14 MED ORDER — CYCLOBENZAPRINE HCL 10 MG PO TABS: 10.0000 mg | ORAL_TABLET | Freq: Two times a day (BID) | ORAL | 0 refills | Status: DC | PRN

## 2022-10-14 MED ORDER — CYCLOBENZAPRINE HCL 10 MG PO TABS
10.0000 mg | ORAL_TABLET | Freq: Once | ORAL | Status: AC
  Administered 2022-10-14: 10 mg via ORAL
  Filled 2022-10-14: qty 1

## 2022-10-14 MED ORDER — ACETAMINOPHEN 500 MG PO TABS
1000.0000 mg | ORAL_TABLET | Freq: Once | ORAL | Status: AC
  Administered 2022-10-14: 1000 mg via ORAL
  Filled 2022-10-14: qty 2
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
## 2022-10-14 NOTE — ED Notes (Signed)
PT verbalized understanding of discharge instructions. Verbalized side effects of prescriptions. Stated pain being manageable and ambulated without assistance

## 2022-10-22 ENCOUNTER — Ambulatory Visit (INDEPENDENT_AMBULATORY_CARE_PROVIDER_SITE_OTHER): Payer: Medicaid Other | Admitting: Student

## 2022-10-22 ENCOUNTER — Other Ambulatory Visit: Payer: Self-pay | Admitting: Physician Assistant

## 2022-10-22 ENCOUNTER — Telehealth: Payer: Self-pay

## 2022-10-22 DIAGNOSIS — Z9889 Other specified postprocedural states: Secondary | ICD-10-CM

## 2022-10-22 NOTE — Progress Notes (Signed)
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  She most recently underwent elevation of the skin flaps, local tissue rearrangement and closure of the wound with Dr. Lovena Le on 10/03/2022.  Patient presents to the clinic today for postoperative follow-up.  Patient was most recently seen on 10/09/2022. At this visit, patient still had an area approximately 1 x 1 cm at the T-junction that was being allowed to granulate in. The remainder of the vertical incision was closed and well-approximated. Patient reported minimal drain output from her drains over 48 hours. On exam, the breast was soft and nonerythematous. The incision was intact and dry. The area to the T junction was noted to have granulation that was pink and healthy. The drain was removed at this visit without difficulty. Plan was for patient to keep dressing with Vaseline over the granulating tissue and begin scar massage at 2 weeks postop.  Plan was discussed with patient.  Once the area of the T-junction was completely healed, plan was for either implant placement or fat grafting to increase the volume on the left breast approximately 6 months afterwards.  Since patient's most recent visit, she called requesting more pain medication as ibuprofen was not helping.  It was discussed with the patient that she had received 172 pain pills in the past few months and the importance of transitioning to over-the-counter medications for analgesia was discussed with the patient.  Mobic twice daily was prescribed for the patient to take in conjunction with Tylenol.  It was discussed with the patient that if she is still having intractable pain at her next visit, a referral to pain management should be considered.  Today, patient reports she is doing well.  She states that she feels the wound has gotten a little bigger.  She denies any other issues or concerns.  She denies any  drainage, fevers or chills.  She states that she has been applying Vaseline to her wound daily.  Chaperone present on exam.  On exam, patient is sitting upright in no acute distress.  Left breast is soft.  There is no overlying erythema.  There is a 4 cm x 2 cm x 0.5 cm wound noted to the inferior T-zone.  There is healthy granulation tissue noted throughout the wound.  There is no drainage or malodor noted.  Incision is otherwise intact and healing well.  Donated myriad was placed on the wound and covered with Adaptic, K-Y jelly and Mepilex border dressing.  I discussed with the patient that I would like her to keep this dressing in place for the next 2 to 3 days.  I discussed with her that I would like her to apply K-Y jelly daily to the wound.  I discussed with her that after 2 to 3 days, she may take off her dressings, and apply Adaptic, K-Y jelly and another dressing over the top.  Patient expressed understanding.  [facility] order placed for Adaptic and dressings.  Patient to follow-up in 1 week.  I instructed the patient to call in the meantime if she has any questions or concerns.  Pictures were obtained of the patient and placed in the chart with the patient's or guardian's permission.   Objective findings and plan were discussed with Dr. Lovena Le.

## 2022-10-22 NOTE — Telephone Encounter (Signed)
Faxed wound care order, OV note, demographics, and insurance information to [facility] with confirmed receipt.

## 2022-10-23 ENCOUNTER — Telehealth: Payer: Self-pay | Admitting: *Deleted

## 2022-10-23 NOTE — Telephone Encounter (Signed)
Fax received "[facility] has provided service for the patient; no further action required"

## 2022-10-28 NOTE — Congregational Nurse Program (Signed)
  Dept: [phone]   Congregational Nurse Program Note  Date of Encounter: 10/28/2022  Past Medical History: Past Medical History:  Diagnosis Date   Anemia    Anxiety    COVID 07/2021   mild   Depression    Heart murmur    asymptomatic    Encounter Details:  CNP Questionnaire - 10/28/22 1641       Questionnaire   Ask client: Do you give verbal consent for me to treat you today? 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    Insurance/Financial Assistance Referral N/A    Medical Provider Yes    Screening Referrals Made N/A    Medical Referrals Made N/A    Medical Appointment Made N/A    Recently w/o PCP, now 1st time PCP visit completed due to CNs referral or appointment made N/A    Food Have Food Insecurities    Transportation N/A    Housing/Utilities No permanent housing    Interpersonal Safety N/A    Interventions N/A    Abnormal to Normal Screening Since Last CN Visit N/A    Screenings CN Performed N/A    Sent Client to Lab for: N/A    Did client attend any of the following based off CNs referral or appointments made? N/A    ED Visit Averted N/A    Life-Saving Intervention Made N/A             Spoke with client briefly. Says she is doing better and has a Nature conservation officer. Continues to receive care. No other complaints.  Joya Gaskins, RN, BSN

## 2022-10-29 ENCOUNTER — Ambulatory Visit (INDEPENDENT_AMBULATORY_CARE_PROVIDER_SITE_OTHER): Payer: Self-pay | Admitting: Student

## 2022-10-29 DIAGNOSIS — T8131XS Disruption of external operation (surgical) wound, not elsewhere classified, sequela: Secondary | ICD-10-CM

## 2022-10-29 DIAGNOSIS — Z9889 Other specified postprocedural states: Secondary | ICD-10-CM

## 2022-10-29 NOTE — Progress Notes (Signed)
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  She most recently underwent elevation of the skin flaps, local tissue rearrangement and closure of the wound with Dr. Ladona Ridgel on 10/03/2022.  Patient presents to the clinic today for postoperative follow-up.  Patient was last seen in the clinic on 10/22/2022.  At this visit, patient reported she was doing well.  On exam, there was a 4 cm x 2 cm x 0.5 cm wound to the left inferior T-zone.  There is healthy granulation tissue noted throughout the wound.  Donated myriad was placed over the wound and covered by Adaptic, K-Y jelly and a Mepilex border dressing.  Plan is for patient to apply Adaptic, K-Y jelly and a dressing over the top daily.  Patient was to follow-up in 1 week.  Today, patient reports she is doing well.  She states that she has been doing the dressing changes without issue.  She reports that she has some sutures that are poking out and bothering her.  She denies any other issues or concerns.  She denies any fevers or chills.  Chaperone present on exam.  On exam, patient is sitting upright in no acute distress.  Left breast is soft.  There is no overlying erythema.  To the inferior T-zone, there is a 3.5 cm x 2 cm wound.  There appears to be healthy appearing granulation tissue noted throughout the wound.  Wound appears to be slightly improved from last week.  There were several suture knots noted to the incision sites.  These were cut and removed.  Patient tolerated well.  There was a small area of superficial irritation from the suture to the vertical limb.  There is some firmness underneath the lateral incision that is consistent with scarring.  There are no signs of infection on exam.  I discussed with the patient that I would like her to continue to apply Adaptic, K-Y jelly and a dressing over the area daily.  Patient expressed understanding.  I discussed  with the patient I would like her to apply a little bit of Vaseline to where the suture was to the vertical limb incision.  I also discussed with the patient that I would like her to massage her incision laterally with a little bit of lotion to help with the scarring.  Patient expressed understanding.  I discussed with the patient that she can do light activity and that she should avoid lifting greater than 20 pounds.  Patient expressed understanding.  Patient to follow back up in 2 to 3 weeks.  I instructed the patient in the meantime to call back if she has any questions or concerns.  Pictures were obtained of the patient and placed in the chart with the patient's or guardian's permission.

## 2022-11-03 ENCOUNTER — Encounter: Payer: Medicaid Other | Admitting: Student

## 2022-11-12 NOTE — Progress Notes (Signed)
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  Patient most recently underwent elevation of the skin flaps, local tissue rearrangement and and closure of the wound with Dr. Ladona Ridgel on 10/03/2022.  Patient presents to the clinic today for postoperative follow-up.  Patient was last seen in the clinic on 10/29/2022.  At this visit, patient reported she was doing well.  On exam, the wound to the left inferior T-zone was approximately 3.5 cm x 2.0 cm.  There appeared to be healthy granulation tissue throughout the wound.  Several suture knots were also removed from the vertical limb incision.  Plan was for patient to apply Adaptic, K-Y jelly and a dressing over the area daily, as well as Vaseline to the vertical limb incision.  Plan is for patient to follow back up in 2 to 3 weeks.  Today, patient reports she is doing okay.  She states that she has noticed a little bit of drainage from the vertical limb of her left breast incision.  She states it is also little bit painful.  She denies any fevers or chills.  She states that she has been doing Adaptic and K-Y jelly dressings without issues.  Chaperone present on exam.  On exam, patient is sitting upright in no acute distress.  Left breast is soft.  To the vertical incision, there is a small pinpoint wound.  There was some purulent drainage expressed on exam.  This was cultured.  There was some mild surrounding erythema.  There was no obvious fluid collection or fluctuance noted on exam.  The wound to the inferior T-zone appears to be healing well and appears to be epithelializing.  There is no surrounding erythema or drainage to this area.  I discussed with the patient that we will send the drainage off for culture.  In the meantime, we will start her on antibiotics.  I discussed with the patient would like her to closely monitor the area.  I discussed with her that if it  becomes more red, if she develops any more drainage, any more pain, or has any worsening symptoms or any fevers or chills to give [place] a call.  Patient expressed understanding.  I discussed with the patient that she may transition to Vaseline to the inferior wound.  I discussed with her that I would like her to apply gauze daily or as needed over the small pinpoint wound for now to allow it to drain.  Patient expressed understanding.  Patient states that she is going on a trip to [place] Wednesday evening.  I discussed with her that I would like to do a telephone visit with her Wednesday to check in to see if her symptoms are improving.  Patient was in agreement with this plan.  Patient is to otherwise follow-up on Monday.  I instructed her to call us if she has any questions or concerns about anything.  Pictures were obtained of the patient and placed in the chart with the patient's or guardian's permission.

## 2022-11-17 ENCOUNTER — Other Ambulatory Visit (HOSPITAL_COMMUNITY)
Admission: RE | Admit: 2022-11-17 | Discharge: 2022-11-17 | Disposition: A | Discharge status: Home / Self Care | Payer: No Typology Code available for payment source | Source: Ambulatory Visit | Attending: Student | Admitting: Student

## 2022-11-17 ENCOUNTER — Encounter: Payer: Self-pay | Admitting: Student

## 2022-11-17 ENCOUNTER — Ambulatory Visit (INDEPENDENT_AMBULATORY_CARE_PROVIDER_SITE_OTHER): Payer: No Typology Code available for payment source | Admitting: Student

## 2022-11-17 DIAGNOSIS — L659 Nonscarring hair loss, unspecified: Secondary | ICD-10-CM | POA: Diagnosis not present

## 2022-11-17 DIAGNOSIS — T8131XS Disruption of external operation (surgical) wound, not elsewhere classified, sequela: Secondary | ICD-10-CM | POA: Insufficient documentation

## 2022-11-17 DIAGNOSIS — H5712 Ocular pain, left eye: Secondary | ICD-10-CM | POA: Diagnosis not present

## 2022-11-17 DIAGNOSIS — X58XXXS Exposure to other specified factors, sequela: Secondary | ICD-10-CM | POA: Diagnosis not present

## 2022-11-17 DIAGNOSIS — R102 Pelvic and perineal pain: Secondary | ICD-10-CM | POA: Diagnosis not present

## 2022-11-17 MED ORDER — DOXYCYCLINE HYCLATE 100 MG PO TABS: 100.0000 mg | ORAL_TABLET | Freq: Two times a day (BID) | ORAL | 0 refills | Status: AC

## 2022-11-19 ENCOUNTER — Telehealth: Payer: No Typology Code available for payment source | Admitting: Student

## 2022-11-19 ENCOUNTER — Ambulatory Visit (INDEPENDENT_AMBULATORY_CARE_PROVIDER_SITE_OTHER): Payer: No Typology Code available for payment source | Admitting: Student

## 2022-11-19 DIAGNOSIS — T8131XS Disruption of external operation (surgical) wound, not elsewhere classified, sequela: Secondary | ICD-10-CM

## 2022-11-19 DIAGNOSIS — T8131XA Disruption of external operation (surgical) wound, not elsewhere classified, initial encounter: Secondary | ICD-10-CM

## 2022-11-19 NOTE — Progress Notes (Deleted)
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 Patient most recently underwent elevation of the skin flaps, local tissue rearrangement and and closure of the wound with Dr. Ladona Ridgel on 10/03/2022.  Patient presents for telephone encounter today for follow-up from her appointment on Monday.  Patient was last seen in the clinic on 11/17/2022.  At this visit, patient was found to have a little bit of purulent drainage draining from a pinpoint wound to the vertical limb incision.  There is also some mild surrounding erythema.  The wound to the inferior T-zone was healing well.  Culture was sent of the drainage and patient was placed on doxycycline.  Today,

## 2022-11-19 NOTE — Progress Notes (Signed)
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 Patient most recently underwent elevation of the skin flaps, local tissue rearrangement and and closure of the wound with Dr. Ladona Ridgel on 10/03/2022.  Patient presents for telephone encounter today for follow-up from her appointment on Monday.  Patient was last seen in the clinic on 11/17/2022.  At this visit, patient was found to have a little bit of purulent drainage draining from a pinpoint wound to the vertical limb incision.  There is also some mild surrounding erythema.  The wound to the inferior T-zone was healing well.  Culture was sent of the drainage and patient was placed on doxycycline.  Culture has so far grown out Staphylococcus aureus.  It is sensitive to tetracyclines.  Today, patient reports she is doing okay.  She states that she still has some tenderness and a little bit of drainage from her vertical limb incision.  She denies any fevers or chills.  Patient states that she picked up the antibiotic and has started to take it.  I discussed the results of the culture with the patient.  Chaperone present on exam.  On exam, patient is sitting upright in no acute distress.  There is a pinpoint wound to the vertical limb incision.  I am able to express a tiny amount of purulent drainage, less than her previous exam.  There is some erythema noted to the skin medial of this wound.  There is a little bit of overlying irritation to the skin as well.  There is some mild fluctuance noted consistent with possibly a small amount of fluid underneath the skin.  There are some mild tenderness to palpation.  Wound to the inferior T-zone appears to be healing well.  There is a small 1 cm x 1 cm area that has not still not yet epithelialized.  There is no sign of infection in this area.  Given that there was a little bit of fluctuance noted to the area medial to her vertical  limb incision, I offered to drain this area to help facilitate getting any sort of purulent drainage out of the area.  Patient declined drainage of the area at this time.  I instructed the patient to continue her antibiotic as directed and to apply warm compresses to the area as well.  I instructed the patient to monitor the area closely.  I told her to call us if she has any worsening redness, pain, swelling, drainage, fevers or chills, or any concerning symptoms.  I also instructed her to apply gauze daily to the pinpoint wound and to change as needed.  Patient expressed understanding.  Given that patient is going out of town this week and, I did warn her that if this progresses or gets worse, there is a chance she may have to go to the emergency room or seek care there if she is having any major issues.  Patient expressed understanding.  Patient to follow back up on Monday.  She is to call in the meantime if she has any questions or concerns.

## 2022-11-22 LAB — AEROBIC/ANAEROBIC CULTURE W GRAM STAIN (SURGICAL/DEEP WOUND)

## 2022-11-24 ENCOUNTER — Ambulatory Visit (INDEPENDENT_AMBULATORY_CARE_PROVIDER_SITE_OTHER): Payer: No Typology Code available for payment source | Admitting: Student

## 2022-11-24 ENCOUNTER — Encounter: Payer: No Typology Code available for payment source | Admitting: Student

## 2022-11-24 DIAGNOSIS — Z9889 Other specified postprocedural states: Secondary | ICD-10-CM

## 2022-11-24 DIAGNOSIS — T8131XS Disruption of external operation (surgical) wound, not elsewhere classified, sequela: Secondary | ICD-10-CM

## 2022-11-24 NOTE — Progress Notes (Signed)
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 Patient most recently underwent elevation of the skin flaps, local tissue rearrangement and and closure of the wound with Dr. Ladona Ridgel on 10/03/2022.  Patient presents to the clinic today for follow-up.  Patient was last seen in the clinic on 11/19/2022.  At this visit, patient reported she was doing okay.  She still was complaining of some drainage and tenderness to her vertical limb incision.  The culture grew out Staphylococcus aureus which was sensitive to tetracyclines.  Patient had been taking doxycycline.  On exam, there is a pinpoint wound to the vertical limb incision and there is a small amount of purulent drainage expressed, less than the previous exam.  There was a little bit of fluctuance also noted to the area.  Wound to the inferior T-zone appear to be healing well, there is still a small 1 cm x 1 cm area that had not yet epithelialized.  Drainage of the area of fluctuance was offered to the patient, but she declined at that time.  Instructed patient to apply Vaseline to the 1 cm x 1 cm area of the wound to the inferior T-zone.  Today, patient reports she is doing okay.  She states that she is not having any more drainage.  She also reports the tenderness has been improving.  Patient reports she has been taking the antibiotics and has a few more doses left.  She denies any fevers or chills.  Patient also states that she has some concerns about the eventual outcome of her breasts.  She states that she feels her right breast is too small after her reduction and would like to be slightly bigger eventually.    Chaperone present on exam.  On exam, patient is sitting upright in no acute distress.  Left breast is soft.  There is no overlying erythema.  There is a pinpoint wound noted to the central aspect of the incision and a 0.5 cm x 0.5 cm wound to the inferior  aspect of the incision that both appear to have some scabbing over it.  There is no drainage from either of these wounds.  Just medial to the skin of the incision, there is a little bit of irritation where the fluctuance was noted at her previous visit.  There is no erythema or fluctuance noted today.  I discussed with the patient that the area appears to be improving and that there no longer signs of infection on exam.  I did discuss with the patient though that I would like her to finish her course of antibiotics as directed.  Patient expressed understanding.  I discussed with the patient would like her to apply a small amount of Vaseline or Aquaphor over the irritated skin, and the 2 wounds to the incision.  Patient expressed understanding.  I discussed with the patient that we will have her follow-up next week to make sure she is still healing, but then we will have her follow-up with Dr. Ladona Ridgel to discuss her concerns about her eventual outcome.  Patient understands that surgery will not be done for several months until she is completely healed.  Patient was in agreement with this plan.  I discussed with the patient that I would like her to call the clinic if she has any questions or concerns about anything.  Patient to follow-up next week.  Pictures were obtained of the patient and placed in the  chart with the patient's or guardian's permission.

## 2022-11-24 NOTE — Progress Notes (Deleted)
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 Patient most recently underwent elevation of the skin flaps, local tissue rearrangement and and closure of the wound with Dr. Ladona Ridgel on 10/03/2022.  Patient presents to the clinic today for follow-up.     Patient was last seen in the clinic on 11/19/2022.  At this visit, patient reported she was doing okay.  She stated that she was still having some tenderness and drainage from her vertical limb incision.  Culture had grown out Staphylococcus aureus, which was sensitive to tetracyclines.  Patient had been placed on doxycycline.  On exam, there was a pinpoint wound to the vertical limb incision with a small amount of purulent drainage expressed.  There was some irritation to the skin just medial to the wound and some possible amount of fluctuance noted underneath.  Drainage of this area was offered to the patient.  Patient declined.  Plan was for patient to continue her antibiotic and apply warm compresses to the area.  Today,

## 2022-12-01 ENCOUNTER — Ambulatory Visit (INDEPENDENT_AMBULATORY_CARE_PROVIDER_SITE_OTHER): Payer: No Typology Code available for payment source | Admitting: Physician Assistant

## 2022-12-01 DIAGNOSIS — Z719 Counseling, unspecified: Secondary | ICD-10-CM

## 2022-12-01 NOTE — Progress Notes (Signed)
Patient did not show for appointment.   

## 2022-12-09 ENCOUNTER — Other Ambulatory Visit: Payer: Self-pay

## 2022-12-09 ENCOUNTER — Encounter: Payer: Self-pay | Admitting: Obstetrics and Gynecology

## 2022-12-09 ENCOUNTER — Ambulatory Visit (INDEPENDENT_AMBULATORY_CARE_PROVIDER_SITE_OTHER): Payer: No Typology Code available for payment source | Admitting: Obstetrics and Gynecology

## 2022-12-09 VITALS — BP 112/74 | HR 67

## 2022-12-09 DIAGNOSIS — R1031 Right lower quadrant pain: Secondary | ICD-10-CM | POA: Diagnosis not present

## 2022-12-09 DIAGNOSIS — N80C19 Endometriosis of the anterior abdominal wall, unspecified depth: Secondary | ICD-10-CM

## 2022-12-09 DIAGNOSIS — Z13228 Encounter for screening for other metabolic disorders: Secondary | ICD-10-CM

## 2022-12-09 HISTORY — DX: Encounter for screening for other metabolic disorders: Z13.228

## 2022-12-09 MED ORDER — DROSPIRENONE-ETHINYL ESTRADIOL 3-0.02 MG PO TABS: 1.0000 | ORAL_TABLET | Freq: Every day | ORAL | 11 refills | Status: DC

## 2022-12-09 NOTE — Progress Notes (Signed)
GYNECOLOGY VISIT  Patient name: Gail Brewer MRN [id number]  Date of birth: 10-05-1975 Chief Complaint:   Follow-up  History:  Gail Brewer is a 47 y.o. [phone] being seen today for abdominal pain concern for endometriosis in abdominal wall.  Increasing abdominal mass - previously prescribed birth control but not able to take it. Previously had surgery in 2020 for removal of abdominal wall endometriosis. Feels the area has gotten bigger. Hurting the most around the time of her cycle, back to severe pain that keeps her from working out and working for days at a time.  Pain is sharp  Notes having bloating in between cycles  Decades ago she was on birth control.   Slynd may have prevoiusly not been covered but now may be but not sure  Notes a hx of depression, previously prescribed zoloft though not currently needed.Denies SI  Per op note in 2023: "The mass was in the subcutaneous tissue and I excised in its entirety peeling it right off of the underlying fascia and old C-section scar. The mass was then sent to pathology for evaluation. Again it measured 6 cm x 4 cm in size. The underlying fascia was intact. The fascia self was fairly scarred and firm across the entire length of the visible old C-section scar. "  FINAL MICROSCOPIC DIAGNOSIS:   A. SOFT TISSUE MASS, ABDOMINAL WALL, EXCISION:  - Endometriosis.  - Negative for malignancy.   Past Medical History:  Diagnosis Date   Anemia    Anxiety    COVID 07/2021   mild   Depression    Heart murmur    asymptomatic    Past Surgical History:  Procedure Laterality Date   APPLICATION OF WOUND VAC Left 08/14/2022   Procedure: APPLICATION OF WOUND VAC;  Surgeon: Santiago Glad, MD;  Location: [facility];  Service: Plastics;  Laterality: Left;   APPLICATION OF WOUND VAC Left 08/18/2022   Procedure: APPLICATION OF WOUND VAC;  Surgeon: Santiago Glad, MD;  Location: [facility];  Service: Plastics;  Laterality: Left;   BREAST IMPLANT  REMOVAL Left 07/22/2022   Procedure: DEBRIDEMENT LEFT BREAST;  Surgeon: Glenna Fellows, MD;  Location: [facility];  Service: Plastics;  Laterality: Left;   BREAST REDUCTION SURGERY Bilateral 07/11/2022   Procedure: MAMMARY REDUCTION  (BREAST);  Surgeon: Glenna Fellows, MD;  Location: [facility];  Service: Plastics;  Laterality: Bilateral;   CESAREAN SECTION     x 2   CESAREAN SECTION  07/24/2011   Procedure: CESAREAN SECTION;  Surgeon: Fortino Sic, MD;  Location: [facility];  Service: Gynecology;  Laterality: N/A;  Repeat Cesarian Section   IRRIGATION AND DEBRIDEMENT OF WOUND WITH SPLIT THICKNESS SKIN GRAFT Left 08/14/2022   Procedure: Washout and placement of wound vac;  Surgeon: Santiago Glad, MD;  Location: [facility];  Service: Plastics;  Laterality: Left;   PRIMARY CLOSURE Left 10/03/2022   Procedure: lifting skin flaps on left breast for reapproximation and primary closure;  Surgeon: Santiago Glad, MD;  Location:  SURGERY CENTER;  Service: Plastics;  Laterality: Left;   RESECTION OF ABDOMINAL MASS Right 11/20/2021   Procedure: EXCISION RIGHT LOWER ABDOMINAL WALL MASS;  Surgeon: Abigail Miyamoto, MD;  Location:  SURGERY CENTER;  Service: General;  Laterality: Right;    The following portions of the patient's history were reviewed and updated as appropriate: allergies, current medications, past family history, past medical history, past social history, past surgical history and  problem list.   Health Maintenance:   Last pap     Component Value Date/Time   DIAGPAP  05/08/2022 1201    - Negative for intraepithelial lesion or malignancy (NILM)   HPVHIGH Negative 05/08/2022 1201   ADEQPAP  05/08/2022 1201    Satisfactory for evaluation; transformation zone component ABSENT.    High Risk HPV: Positive  Adequacy:  Satisfactory for evaluation, transformation zone component PRESENT  Diagnosis:  Atypical squamous cells of undetermined  significance (ASC-US)    Review of Systems:  Pertinent items are noted in HPI. Comprehensive review of systems was otherwise negative.   Objective:  Physical Exam BP 112/74   Pulse 67    Physical Exam Vitals and nursing note reviewed.  Constitutional:      Appearance: Normal appearance.  HENT:     Head: Normocephalic and atraumatic.  Pulmonary:     Effort: Pulmonary effort is normal.  Abdominal:     Comments: Well healed low transverse incision Firmness palpated superior and inferior to right side of incision, no fluctuance and no overlying skin changes   Skin:    General: Skin is warm and dry.  Neurological:     General: No focal deficit present.     Mental Status: She is alert.  Psychiatric:        Mood and Affect: Mood normal.        Behavior: Behavior normal.        Thought Content: Thought content normal.        Judgment: Judgment normal.        Assessment & Plan:   1. Endometriosis of anterior abdominal wall Offered orilissa for menstrual suppression but declined due to concern for worsening depression on the medication. No CHC contraindications, will trial menstrual suppression with OCPs. Recommend continuous administration to achieve suppression. CT A/P to assess abdominal wall lesion/area and if there is compromise/involvement of the fascia, collection or scar tissue.   - drospirenone-ethinyl estradiol (YAZ) 3-0.02 MG tablet; Take 1 tablet by mouth daily.  Dispense: 28 tablet; Refill: 11 - Creatinine  2. Right lower quadrant abdominal pain - CT ABDOMEN PELVIS W CONTRAST; Future     Lorriane Shire, MD Minimally Invasive Gynecologic [facility] for [facility], [facility]

## 2022-12-10 LAB — CREATININE, SERUM
Creatinine, Ser: 0.76 mg/dL (ref 0.57–1.00)
eGFR: 99 mL/min/{1.73_m2} (ref >59)

## 2023-01-05 ENCOUNTER — Ambulatory Visit (HOSPITAL_COMMUNITY): Payer: No Typology Code available for payment source

## 2023-01-12 ENCOUNTER — Encounter: Payer: Self-pay | Admitting: Obstetrics and Gynecology

## 2023-01-13 ENCOUNTER — Other Ambulatory Visit: Payer: Self-pay | Admitting: Obstetrics and Gynecology

## 2023-01-13 DIAGNOSIS — R1031 Right lower quadrant pain: Secondary | ICD-10-CM

## 2023-01-13 MED ORDER — CLONIDINE HCL 0.1 MG PO TABS: 0.1000 mg | ORAL_TABLET | Freq: Two times a day (BID) | ORAL | 2 refills | Status: DC | PRN

## 2023-01-13 MED ORDER — CYCLOBENZAPRINE HCL 10 MG PO TABS: 10.0000 mg | ORAL_TABLET | Freq: Two times a day (BID) | ORAL | 2 refills | Status: DC | PRN

## 2023-02-02 ENCOUNTER — Ambulatory Visit (HOSPITAL_COMMUNITY): Payer: No Typology Code available for payment source

## 2023-02-16 ENCOUNTER — Ambulatory Visit (INDEPENDENT_AMBULATORY_CARE_PROVIDER_SITE_OTHER): Payer: No Typology Code available for payment source | Admitting: Plastic Surgery

## 2023-02-16 VITALS — BP 154/101 | HR 73

## 2023-02-16 DIAGNOSIS — N651 Disproportion of reconstructed breast: Secondary | ICD-10-CM

## 2023-02-16 DIAGNOSIS — Z9889 Other specified postprocedural states: Secondary | ICD-10-CM

## 2023-02-16 DIAGNOSIS — T8131XS Disruption of external operation (surgical) wound, not elsewhere classified, sequela: Secondary | ICD-10-CM

## 2023-02-16 NOTE — Progress Notes (Signed)
Gail Brewer returns today for evaluation and scheduling for fat grafting to the left breast.  She is overall doing well though she is unhappy with the size and shape of the unaffected breast as well as the breast with the nipple was lost.  I have told her that we will not be addressing her right breast at this time.  My goal for the time being is to achieve symmetry between the unaffected right breast and the left breast where the nipple and portion of the pedicle was lost.  Will plan the first round of fat grafting to the left breast.  I have shown her where the incisions will be for the liposuction to harvest the fat and the fact that there will be no incisions only needle puncture sites for the breast.  Will schedule at her request

## 2023-02-17 ENCOUNTER — Ambulatory Visit (INDEPENDENT_AMBULATORY_CARE_PROVIDER_SITE_OTHER): Payer: No Typology Code available for payment source | Admitting: Obstetrics and Gynecology

## 2023-02-17 ENCOUNTER — Other Ambulatory Visit: Payer: Self-pay

## 2023-02-17 ENCOUNTER — Other Ambulatory Visit (HOSPITAL_COMMUNITY)
Admission: RE | Admit: 2023-02-17 | Discharge: 2023-02-17 | Disposition: A | Discharge status: Home / Self Care | Payer: No Typology Code available for payment source | Source: Ambulatory Visit | Attending: Obstetrics and Gynecology | Admitting: Obstetrics and Gynecology

## 2023-02-17 VITALS — BP 125/83 | HR 93 | Wt 220.2 lb

## 2023-02-17 DIAGNOSIS — N80C19 Endometriosis of the anterior abdominal wall, unspecified depth: Secondary | ICD-10-CM

## 2023-02-17 DIAGNOSIS — Z113 Encounter for screening for infections with a predominantly sexual mode of transmission: Secondary | ICD-10-CM | POA: Insufficient documentation

## 2023-02-17 NOTE — Progress Notes (Signed)
GYNECOLOGY VISIT  Patient name: Gail Brewer MRN [id number]  Date of birth: February 20, 1976 Chief Complaint:   Follow-up   History:  Gail Brewer is a 47 y.o. [phone] being seen today for abdominal wall pain.  Clonidine knocks it down a level but continues to have significant pain and interested in surgical removal again. Reports increase in size of LLQ area with menses and regression after.   Wants handciap placard due to not being able to walk distance of regular parking spots from her job  Past Medical History:  Diagnosis Date   Anemia    Anxiety    COVID 07/2021   mild   Depression    Encounter for screening for other metabolic disorders 12/09/2022   Heart murmur    asymptomatic   Open wound of left breast 08/07/2022    Past Surgical History:  Procedure Laterality Date   APPLICATION OF WOUND VAC Left 08/14/2022   Procedure: APPLICATION OF WOUND VAC;  Surgeon: Santiago Glad, MD;  Location: [facility];  Service: Plastics;  Laterality: Left;   APPLICATION OF WOUND VAC Left 08/18/2022   Procedure: APPLICATION OF WOUND VAC;  Surgeon: Santiago Glad, MD;  Location: [facility];  Service: Plastics;  Laterality: Left;   BREAST IMPLANT REMOVAL Left 07/22/2022   Procedure: DEBRIDEMENT LEFT BREAST;  Surgeon: Glenna Fellows, MD;  Location: [facility];  Service: Plastics;  Laterality: Left;   BREAST REDUCTION SURGERY Bilateral 07/11/2022   Procedure: MAMMARY REDUCTION  (BREAST);  Surgeon: Glenna Fellows, MD;  Location: [facility];  Service: Plastics;  Laterality: Bilateral;   CESAREAN SECTION     x 2   CESAREAN SECTION  07/24/2011   Procedure: CESAREAN SECTION;  Surgeon: Fortino Sic, MD;  Location: [facility];  Service: Gynecology;  Laterality: N/A;  Repeat Cesarian Section   IRRIGATION AND DEBRIDEMENT OF WOUND WITH SPLIT THICKNESS SKIN GRAFT Left 08/14/2022   Procedure: Washout and placement of wound vac;  Surgeon: Santiago Glad, MD;  Location: [facility];  Service: Plastics;  Laterality: Left;   PRIMARY CLOSURE Left 10/03/2022   Procedure: lifting skin flaps on left breast for reapproximation and primary closure;  Surgeon: Santiago Glad, MD;  Location: [facility];  Service: Plastics;  Laterality: Left;   RESECTION OF ABDOMINAL MASS Right 11/20/2021   Procedure: EXCISION RIGHT LOWER ABDOMINAL WALL MASS;  Surgeon: Abigail Miyamoto, MD;  Location: [facility];  Service: General;  Laterality: Right;    The following portions of the patient's history were reviewed and updated as appropriate: allergies, current medications, past family history, past medical history, past social history, past surgical history and problem list.    Review of Systems:  Pertinent items are noted in HPI. Comprehensive review of systems was otherwise negative.   Objective:  Physical Exam BP 125/83   Pulse 93   Wt 220 lb 3.2 oz (99.9 kg)   BMI 35.54 kg/m    Physical Exam Vitals and nursing note reviewed.  Constitutional:      Appearance: Normal appearance.  HENT:     Head: Normocephalic and atraumatic.  Pulmonary:     Effort: Pulmonary effort is normal.  Abdominal:     Comments: Firmness deep to low transverse incision with swelling superior to incision, no overlying erythema and mildly tender to palpation   Skin:    General: Skin is warm and dry.  Neurological:     General: No focal deficit present.  Mental Status: She is alert.  Psychiatric:        Mood and Affect: Mood normal.        Behavior: Behavior normal.        Thought Content: Thought content normal.        Judgment: Judgment normal.         Assessment & Plan:   1. Endometriosis of anterior abdominal wall Suspect recurrence of abdominal wall endometriosis. Insurance denied coverage for CT A/P  - will scheduled abdominal wall [place]. Unclear if fascia involved in lesion - will additionally reach out to general surgeon who did original excision as there may  be need for mesh at next removal.   2. Routine screening for STI (sexually transmitted infection)  - Cervicovaginal ancillary only - RPR+HBsAg+HCVAb+...  Lorriane Shire, MD Minimally Invasive Gynecologic Surgery Center for [facility], [facility]

## 2023-02-19 ENCOUNTER — Ambulatory Visit (HOSPITAL_COMMUNITY): Payer: No Typology Code available for payment source | Attending: Obstetrics and Gynecology

## 2023-02-23 ENCOUNTER — Telehealth: Payer: Self-pay | Admitting: General Practice

## 2023-02-23 NOTE — Telephone Encounter (Signed)
-----   Message from Lorriane Shire sent at 02/23/2023  7:57 AM EDT ----- Regarding: reschedule [place] Hello,  Patient did not show for [place] - please contact to help re-schedule. Her general surgeon has been contacted and would also like the [place] completed for surgical planning.  Best,  Briscoe Deutscher

## 2023-02-23 NOTE — Telephone Encounter (Signed)
Called patient and assisted her with rescheduling the ultrasound to 8/12 @ 10am. Patient missed previous appt because she was never notified about it. Patient had no questions.

## 2023-03-02 ENCOUNTER — Ambulatory Visit (HOSPITAL_COMMUNITY)
Admission: RE | Admit: 2023-03-02 | Discharge: 2023-03-02 | Disposition: A | Discharge status: Home / Self Care | Payer: No Typology Code available for payment source | Source: Ambulatory Visit | Attending: Obstetrics and Gynecology | Admitting: Obstetrics and Gynecology

## 2023-03-02 DIAGNOSIS — R1900 Intra-abdominal and pelvic swelling, mass and lump, unspecified site: Secondary | ICD-10-CM | POA: Diagnosis not present

## 2023-03-02 DIAGNOSIS — N80C19 Endometriosis of the anterior abdominal wall, unspecified depth: Secondary | ICD-10-CM | POA: Insufficient documentation

## 2023-03-02 DIAGNOSIS — Z9889 Other specified postprocedural states: Secondary | ICD-10-CM | POA: Diagnosis not present

## 2023-03-25 ENCOUNTER — Telehealth: Payer: Self-pay | Admitting: Plastic Surgery

## 2023-03-25 NOTE — Telephone Encounter (Signed)
Monia Pouch called to authorize these codes for patient - 19380, 15771, and 56213. The authorization number is [id number] and is approved from 03/19/23-09/17/22.

## 2023-04-11 ENCOUNTER — Other Ambulatory Visit: Payer: Self-pay | Admitting: Obstetrics and Gynecology

## 2023-04-11 DIAGNOSIS — R1031 Right lower quadrant pain: Secondary | ICD-10-CM

## 2023-04-27 ENCOUNTER — Ambulatory Visit (INDEPENDENT_AMBULATORY_CARE_PROVIDER_SITE_OTHER): Payer: No Typology Code available for payment source | Admitting: Plastic Surgery

## 2023-04-27 DIAGNOSIS — Z9889 Other specified postprocedural states: Secondary | ICD-10-CM

## 2023-04-27 DIAGNOSIS — N651 Disproportion of reconstructed breast: Secondary | ICD-10-CM | POA: Diagnosis not present

## 2023-04-27 DIAGNOSIS — T8131XS Disruption of external operation (surgical) wound, not elsewhere classified, sequela: Secondary | ICD-10-CM

## 2023-04-27 NOTE — Progress Notes (Signed)
Gail Brewer is status post management of a left breast wound dehiscence and nipple ischemia.  She returns today with several questions regarding future surgery.  She was interested in how the fat will be harvested for fat grafting to the breast.  She was also interested in whether this will be symmetric and what type of defect will be left.  We discussed the procedure at length.  I told her that I will use which ever portion of her abdomen she would like but I will harvest fat symmetrically either across the front of her abdomen or from the flanks.  She prefers the fat to be harvested from the flanks.  We discussed the placement of the fat in the breast.  She understands that this may require 2 or 3 rounds of fat grafting as the fat does not always completely agree vascularize prior to it being absorbed.  As far as the skin envelope I anticipate that the skin envelope will stretch with time and that she will have good symmetry from side-to-side.  Postoperatively I anticipate little to no discomfort.  Any discomfort she experiences will likely be in the flanks where the fat is harvested.  She will need to wear compressive garment to prevent seroma formation.  All questions were answered to her satisfaction and she has requested that I proceed with scheduling of surgery.

## 2023-06-03 ENCOUNTER — Ambulatory Visit: Payer: No Typology Code available for payment source | Admitting: Student

## 2023-06-03 VITALS — BP 164/88 | HR 71

## 2023-06-03 DIAGNOSIS — Z9889 Other specified postprocedural states: Secondary | ICD-10-CM

## 2023-06-03 MED ORDER — OXYCODONE HCL 5 MG PO TABS: 5.0000 mg | ORAL_TABLET | Freq: Four times a day (QID) | ORAL | 0 refills | Status: DC | PRN

## 2023-06-03 MED ORDER — ONDANSETRON HCL 4 MG PO TABS: 4.0000 mg | ORAL_TABLET | Freq: Three times a day (TID) | ORAL | 0 refills | Status: DC | PRN

## 2023-06-03 NOTE — Progress Notes (Signed)
Patient ID: GURTHA LINDWALL, female    DOB: 1976-05-08, 47 y.o.   MRN: [id number]  Chief Complaint  Patient presents with   Pre-op Exam      ICD-10-CM   1. S/P bilateral breast reduction  Z98.890        History of Present Illness: Gail Brewer is a 47 y.o.  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  She presents for preoperative evaluation for upcoming procedure, fat grafting to the left breast, scheduled for 06/23/2023 with Dr. Ladona Ridgel.  The patient has not had problems with anesthesia.  Patient reports her last mammogram was in January 2023.  She states she has not gotten an updated mammogram since then due to complications she has been having after breast surgery.  She denies any personal family history of breast cancer.  She denies any history of cardiac disease.  She denies taking any blood thinners.  Patient reports she is not a smoker.  Patient reports she is not taking birth control or hormone replacement at this time.  She reports history of 1 miscarriage.  She denies any personal family history of blood clots or clotting diseases.  She denies any recent surgeries, traumas, infections.  She denies any history of stroke or heart attack.  She denies any history of Crohn's disease or ulcerative colitis.  She denies any history of COPD or asthma.  Patient denies any history of cancer.  She denies any varicosities to her lower extremities.  She denies any recent fevers, chills or changes in her health.  Summary of Previous Visit: Patient was last seen in the clinic by Dr. Ladona Ridgel on 04/27/2023.  At this visit, patient and Dr. Ladona Ridgel discussed surgery in length and what would be involved.  Job: Not working at this time  PMH Significant for: Macromastia status post bilateral breast reduction, necrosis of entire skin flap, nipple areolar necrosis, endometriosis  Patient's blood pressure is mildly elevated at  clinic today.  She states that her blood pressure can be high when she is having pain from her endometriosis, but is not having pain today.  She denies any headaches, changes in her vision, chest pain or shortness of breath.  I recommended that she follow-up with her PCP given high blood pressure.  Patient expressed understanding.   Past Medical History: Allergies: Allergies  Allergen Reactions   Flagyl [Metronidazole] Other (See Comments)    Yeast infection    Current Medications:  Current Outpatient Medications:    cloNIDine (CATAPRES) 0.1 MG tablet, TAKE 1 TABLET(0.1 MG) BY MOUTH TWICE DAILY AS NEEDED (Patient not taking: Reported on 06/03/2023), Disp: 60 tablet, Rfl: 2  Past Medical Problems: Past Medical History:  Diagnosis Date   Anemia    Anxiety    COVID 07/2021   mild   Depression    Encounter for screening for other metabolic disorders 12/09/2022   Heart murmur    asymptomatic   Open wound of left breast 08/07/2022    Past Surgical History: Past Surgical History:  Procedure Laterality Date   APPLICATION OF WOUND VAC Left 08/14/2022   Procedure: APPLICATION OF WOUND VAC;  Surgeon: Santiago Glad, MD;  Location: [facility];  Service: Plastics;  Laterality: Left;   APPLICATION OF WOUND VAC Left 08/18/2022   Procedure: APPLICATION OF WOUND VAC;  Surgeon: Santiago Glad, MD;  Location: [facility];  Service: Plastics;  Laterality: Left;   BREAST IMPLANT  REMOVAL Left 07/22/2022   Procedure: DEBRIDEMENT LEFT BREAST;  Surgeon: Glenna Fellows, MD;  Location: [facility];  Service: Plastics;  Laterality: Left;   BREAST REDUCTION SURGERY Bilateral 07/11/2022   Procedure: MAMMARY REDUCTION  (BREAST);  Surgeon: Glenna Fellows, MD;  Location: [facility];  Service: Plastics;  Laterality: Bilateral;   CESAREAN SECTION     x 2   CESAREAN SECTION  07/24/2011   Procedure: CESAREAN SECTION;  Surgeon: Fortino Sic, MD;  Location: [facility];  Service:  Gynecology;  Laterality: N/A;  Repeat Cesarian Section   IRRIGATION AND DEBRIDEMENT OF WOUND WITH SPLIT THICKNESS SKIN GRAFT Left 08/14/2022   Procedure: Washout and placement of wound vac;  Surgeon: Santiago Glad, MD;  Location: [facility];  Service: Plastics;  Laterality: Left;   PRIMARY CLOSURE Left 10/03/2022   Procedure: lifting skin flaps on left breast for reapproximation and primary closure;  Surgeon: Santiago Glad, MD;  Location: [facility];  Service: Plastics;  Laterality: Left;   RESECTION OF ABDOMINAL MASS Right 11/20/2021   Procedure: EXCISION RIGHT LOWER ABDOMINAL WALL MASS;  Surgeon: Abigail Miyamoto, MD;  Location: [facility];  Service: General;  Laterality: Right;    Social History: Social History   Socioeconomic History   Marital status: Divorced    Spouse name: Not on file   Number of children: Not on file   Years of education: Not on file   Highest education level: Not on file  Occupational History   Not on file  Tobacco Use   Smoking status: Former    Types: Cigars   Smokeless tobacco: Never  Vaping Use   Vaping status: Never Used  Substance and Sexual Activity   Alcohol use: Yes    Alcohol/week: 1.0 standard drink of alcohol    Types: 1 Standard drinks or equivalent per week   Drug use: No   Sexual activity: Not Currently    Birth control/protection: None  Other Topics Concern   Not on file  Social History Narrative   Not on file   Social Determinants of Health   Financial Resource Strain: Not on file  Food Insecurity: No Food Insecurity (08/18/2022)   Hunger Vital Sign    Worried About Running Out of Food in the Last Year: Never true    Ran Out of Food in the Last Year: Never true  Transportation Needs: No Transportation Needs (08/18/2022)   PRAPARE - Administrator, Civil Service (Medical): No    Lack of Transportation (Non-Medical): No  Physical Activity: Not on file  Stress: Not on file  Social  Connections: Not on file  Intimate Partner Violence: Not At Risk (08/18/2022)   Humiliation, Afraid, Rape, and Kick questionnaire    Fear of Current or Ex-Partner: No    Emotionally Abused: No    Physically Abused: No    Sexually Abused: No    Family History: Family History  Problem Relation Age of Onset   Kidney failure Mother    Breast cancer Paternal Grandmother     Review of Systems: Denies any fevers or chills  Physical Exam: Vital Signs BP (!) 164/88 (BP Location: Left Arm, Patient Position: Sitting, Cuff Size: Large)   Pulse 71   SpO2 97%   Physical Exam  Constitutional:      General: Not in acute distress.    Appearance: Normal appearance. Not ill-appearing.  HENT:     Head: Normocephalic and atraumatic.  Neck:  Musculoskeletal: Normal range of motion.  Cardiovascular:     Rate and Rhythm: Normal rate Pulmonary:     Effort: Pulmonary effort is normal. No respiratory distress.  Musculoskeletal: Normal range of motion.  Skin:    General: Skin is warm and dry.     Findings: No erythema or rash.  Neurological:     Mental Status: Alert and oriented to person, place, and time. Mental status is at baseline.  Psychiatric:        Mood and Affect: Mood normal.        Behavior: Behavior normal.    Assessment/Plan: The patient is scheduled for fat grafting to the left breast with Dr. Ladona Ridgel.  Risks, benefits, and alternatives of procedure discussed, questions answered and consent obtained.    Smoking Status: Non-smoker; Counseling Given?  N/A Last Mammogram: Last mammogram was back in January 2023; Results: BI-RADS Category 1 negative - will place order for new mammogram to be done prior to surgery  Caprini Score: 4; Risk Factors include: Age, BMI > 25, and length of planned surgery. Recommendation for mechanical prophylaxis. Encourage early ambulation.   Pictures obtained: @consult , updated photos taken today with patient's permission  Post-op Rx sent to  pharmacy: Oxycodone, Zofran  Instructed patient to hold ibuprofen and vitamin/supplements at least 1 week prior to surgery.  Patient expressed understanding.  Patient was provided with the General Surgical Risk consent document and Pain Medication Agreement prior to their appointment.  They had adequate time to read through the risk consent documents and Pain Medication Agreement. We also discussed them in person together during this preop appointment. All of their questions were answered to their satisfaction.  Recommended calling if they have any further questions.  Risk consent form and Pain Medication Agreement to be scanned into patient's chart.  The consent was obtained with risks and complications reviewed which included bleeding, pain, scar, infection and the risk of anesthesia.  The patients questions were answered to the patients expressed satisfaction.    Electronically signed by: Laurena Spies, PA-C 06/03/2023 10:28 AM

## 2023-06-04 ENCOUNTER — Ambulatory Visit
Admission: RE | Admit: 2023-06-04 | Discharge: 2023-06-04 | Disposition: A | Discharge status: Home / Self Care | Payer: No Typology Code available for payment source | Source: Ambulatory Visit | Attending: Student | Admitting: Student

## 2023-06-04 DIAGNOSIS — Z9889 Other specified postprocedural states: Secondary | ICD-10-CM

## 2023-06-10 ENCOUNTER — Encounter (HOSPITAL_BASED_OUTPATIENT_CLINIC_OR_DEPARTMENT_OTHER): Payer: Self-pay | Admitting: Plastic Surgery

## 2023-06-10 ENCOUNTER — Other Ambulatory Visit: Payer: Self-pay

## 2023-06-23 ENCOUNTER — Ambulatory Visit (HOSPITAL_BASED_OUTPATIENT_CLINIC_OR_DEPARTMENT_OTHER): Admission: RE | Admit: 2023-06-23 | Payer: Medicaid Other | Source: Home / Self Care | Admitting: Plastic Surgery

## 2023-06-23 DIAGNOSIS — Z01818 Encounter for other preprocedural examination: Secondary | ICD-10-CM

## 2023-06-23 SURGERY — LIPOSUCTION, WITH FAT TRANSFER
Anesthesia: Choice

## 2023-07-02 ENCOUNTER — Encounter: Payer: No Typology Code available for payment source | Admitting: Plastic Surgery

## 2023-07-08 ENCOUNTER — Other Ambulatory Visit: Payer: Self-pay

## 2023-07-08 ENCOUNTER — Ambulatory Visit: Payer: Medicaid Other | Admitting: *Deleted

## 2023-07-08 ENCOUNTER — Other Ambulatory Visit (HOSPITAL_COMMUNITY)
Admission: RE | Admit: 2023-07-08 | Discharge: 2023-07-08 | Disposition: A | Discharge status: Home / Self Care | Payer: Medicaid Other | Source: Ambulatory Visit | Attending: Family Medicine | Admitting: Family Medicine

## 2023-07-08 VITALS — BP 123/62 | HR 66 | Ht 65.0 in | Wt 223.5 lb

## 2023-07-08 DIAGNOSIS — Z9189 Other specified personal risk factors, not elsewhere classified: Secondary | ICD-10-CM | POA: Insufficient documentation

## 2023-07-08 NOTE — Progress Notes (Signed)
Pt presents with request for STI testing. She denies vaginal sx however she recently experienced a disturbing and suspicious encounter. Friends were @ her home, were drinking and invited themselves to stay overnight. She stated that everything was fine one minute and the next thing she recalls is waking up and she was undressed. Self swab obtained and pt advised she will be notified of results via Mychart. Pt was informed of [facility] services and counseling available in office. She may schedule appt if desired. She voiced understanding of all information and instructions given.

## 2023-07-09 LAB — HIV ANTIBODY (ROUTINE TESTING W REFLEX): HIV Screen 4th Generation wRfx: NONREACTIVE

## 2023-07-09 LAB — RPR: RPR Ser Ql: NONREACTIVE

## 2023-07-10 LAB — CERVICOVAGINAL ANCILLARY ONLY
Bacterial Vaginitis (gardnerella): POSITIVE — AB
Candida Glabrata: NEGATIVE
Candida Vaginitis: NEGATIVE
Chlamydia: NEGATIVE
Comment: NEGATIVE
Comment: NEGATIVE
Comment: NEGATIVE
Comment: NEGATIVE
Comment: NEGATIVE
Comment: NORMAL
Neisseria Gonorrhea: NEGATIVE
Trichomonas: NEGATIVE

## 2023-07-13 ENCOUNTER — Other Ambulatory Visit: Payer: Self-pay | Admitting: Obstetrics & Gynecology

## 2023-07-13 DIAGNOSIS — N760 Acute vaginitis: Secondary | ICD-10-CM

## 2023-07-13 MED ORDER — METRONIDAZOLE 500 MG PO TABS: 500.0000 mg | ORAL_TABLET | Freq: Two times a day (BID) | ORAL | 0 refills | Status: AC

## 2023-07-13 MED ORDER — FLUCONAZOLE 150 MG PO TABS: 150.0000 mg | ORAL_TABLET | Freq: Once | ORAL | 0 refills | Status: AC

## 2023-07-16 ENCOUNTER — Encounter: Payer: No Typology Code available for payment source | Admitting: Physician Assistant

## 2023-08-12 ENCOUNTER — Telehealth: Payer: Self-pay | Admitting: *Deleted

## 2023-08-12 NOTE — Telephone Encounter (Signed)
Pt left message stating that she is calling with a second message that her prescription from 12/18 is incorrect - is not adequate medication. Per chart review, pt had nurse visit on that Gail Brewer and no prescriptions were sent from this office during or immediately following the visit.

## 2023-08-18 MED ORDER — CLINDAMYCIN HCL 300 MG PO CAPS: 300.0000 mg | ORAL_CAPSULE | Freq: Two times a day (BID) | ORAL | 0 refills | Status: AC

## 2023-08-18 NOTE — Telephone Encounter (Signed)
Called pt and pt reported that the Flagyl that was prescribed she is allergic to.  I asked pt what she has used in the in the past and she reported capsules.   I asked if clindamycin was something she has used in the past.  Pt agreed to clindamycin 300 mg po bid x 7 days.    Leonette Nutting  08/18/23

## 2023-09-04 ENCOUNTER — Emergency Department (HOSPITAL_COMMUNITY): Payer: Medicaid Other

## 2023-09-04 ENCOUNTER — Emergency Department (HOSPITAL_COMMUNITY)
Admission: EM | Admit: 2023-09-04 | Discharge: 2023-09-04 | Disposition: A | Discharge status: Home / Self Care | Payer: Medicaid Other | Attending: Emergency Medicine | Admitting: Emergency Medicine

## 2023-09-04 ENCOUNTER — Other Ambulatory Visit: Payer: Self-pay

## 2023-09-04 DIAGNOSIS — M25562 Pain in left knee: Secondary | ICD-10-CM | POA: Insufficient documentation

## 2023-09-04 DIAGNOSIS — M25561 Pain in right knee: Secondary | ICD-10-CM | POA: Insufficient documentation

## 2023-09-04 NOTE — Discharge Instructions (Signed)
Your pain is likely secondary to tendinitis, please take Tylenol, ibuprofen for your pain control.  Also use Ace wrap, and ice, to help with the pain.  Return to the ER if you have any fevers, chills, redness, or worsening swelling

## 2023-09-04 NOTE — ED Triage Notes (Signed)
Pt states her knees have been getting "worse" over the last 2 week & not working as well. Reports left knee swelling, bilateral knee weakness & pain. Denies any injury.

## 2023-09-04 NOTE — ED Provider Notes (Signed)
[place] EMERGENCY DEPARTMENT AT [facility] Provider Note   CSN: [id number] Arrival date & time: 09/04/23  7829     History  Chief Complaint  Patient presents with   Knee Pain    Gail Brewer is a 48 y.o. female, history of heart murmur, who presents to the ED secondary to bilateral knee pain, has been on for the last 2 to 3 days.  She states that she has had knee pain, after standing for an extended period of time, at her job, and want to be checked out.  Reports a little bit of swelling to the left knee, denies any trauma.  No IV drug use, fevers, chills.  Reports her knees hurt more, when she is walking, denies any alleviating symptoms.  Has been trying to take ibuprofen without relief    Home Medications Prior to Admission medications   Medication Sig Start Date End Date Taking? Authorizing Provider  cloNIDine (CATAPRES) 0.1 MG tablet TAKE 1 TABLET(0.1 MG) BY MOUTH TWICE DAILY AS NEEDED Patient not taking: Reported on 07/08/2023 04/14/23   Lorriane Shire, MD  ibuprofen (ADVIL) 200 MG tablet Take 200 mg by mouth every 6 (six) hours as needed.    [provider]  Multiple Vitamin (MULTIVITAMIN) capsule Take 1 capsule by mouth daily.    [provider]  ondansetron (ZOFRAN) 4 MG tablet Take 1 tablet (4 mg total) by mouth every 8 (eight) hours as needed for up to 20 doses for nausea or vomiting. Patient not taking: Reported on 07/08/2023 06/03/23   Laurena Spies, PA-C  oxyCODONE (ROXICODONE) 5 MG immediate release tablet Take 1 tablet (5 mg total) by mouth every 6 (six) hours as needed for up to 20 doses for severe pain (pain score 7-10). Patient not taking: Reported on 07/08/2023 06/03/23   Laurena Spies, PA-C      Allergies    Flagyl [metronidazole]    Review of Systems   Review of Systems  Musculoskeletal:  Positive for arthralgias.  Skin:  Negative for rash.    Physical Exam Updated Vital Signs BP (!) 154/87 (BP Location:  Left Arm)   Pulse 77   Temp 98.6 F (37 C) (Oral)   Resp 18   Ht 5\' 5"  (1.651 m)   Wt 99.8 kg   LMP 08/07/2023   SpO2 100%   BMI 36.61 kg/m  Physical Exam Vitals and nursing note reviewed.  Constitutional:      General: She is not in acute distress.    Appearance: She is well-developed.  HENT:     Head: Normocephalic and atraumatic.  Eyes:     General:        Right eye: No discharge.        Left eye: No discharge.     Conjunctiva/sclera: Conjunctivae normal.  Pulmonary:     Effort: No respiratory distress.  Musculoskeletal:     Comments: Right Knee: Tenderness to palpation of infrapatellar region. An effusion is not present.  Negative anterior and posterior drawer. Negative Mcmurray's. +Patellar stability. Negative valgus and varus stress test.. Extension and flexion intact, pain worse with extension of knee. No sensory deficits.    Left Knee: Tenderness to palpation of infrapatellar region. An effusion is not present.  Negative anterior and posterior drawer. Negative Mcmurray's. +Patellar stability. Negative valgus and varus stress test.. Extension and flexion intact, pain worse with extension. No sensory deficits.    Neurological:     Mental Status: She is alert.  Comments: Clear speech.   Psychiatric:        Behavior: Behavior normal.        Thought Content: Thought content normal.     ED Results / Procedures / Treatments   Labs (all labs ordered are listed, but only abnormal results are displayed) Labs Reviewed - No data to display  EKG None  Radiology DG Knee Complete 4 Views Right Result Date: 09/04/2023 CLINICAL DATA:  Pain, no injury EXAM: RIGHT KNEE - COMPLETE 4+ VIEW COMPARISON:  None Available. FINDINGS: No evidence of fracture, dislocation, or joint effusion. No evidence of arthropathy or other focal bone abnormality. Soft tissues are unremarkable. IMPRESSION: Negative. Electronically Signed   By: Charlett Nose M.D.   On: 09/04/2023 01:24   DG Knee  Complete 4 Views Left Result Date: 09/04/2023 CLINICAL DATA:  Pain, no injury EXAM: LEFT KNEE - COMPLETE 4+ VIEW COMPARISON:  None Available. FINDINGS: No evidence of fracture, dislocation, or joint effusion. No evidence of arthropathy or other focal bone abnormality. Soft tissues are unremarkable. IMPRESSION: Negative. Electronically Signed   By: Charlett Nose M.D.   On: 09/04/2023 01:23    Procedures Procedures    Medications Ordered in ED Medications - No data to display  ED Course/ Medical Decision Making/ A&P                                 Medical Decision Making Patient is a 48 year old female, here for knee pain, this been going on for the last 2 to 3 days.  This is occurred after standing for long periods of time.  Worse with range of motion.  She states she has slight swelling to left knee.  No edema noted on my exam, she does have tenderness to palpation to the infrapatellar area.  Worse with range of motion such as extension.  Will obtain x-rays for further evaluation.  She does not use IV drugs, not having any fevers or chills, thus reassuring.  Amount and/or Complexity of Data Reviewed Radiology: ordered.    Details: X-rays unremarkable Discussion of management or test interpretation with external provider(s): Discussed with patient, x-rays are unremarkable, recommend Tylenol, ibuprofen, ice, and rest to help with this as well as knee braces.  She became very agitated, and was aggressive with provider.  Patient discharged home, with strict return precautions.  Believed to be likely secondary to tendinitis as she has no red flag symptoms at this time, and there is no overlying erythema, edema, or warmth   Final Clinical Impression(s) / ED Diagnoses Final diagnoses:  Acute pain of both knees    Rx / DC Orders ED Discharge Orders     None         Seva Chancy, Harley [place], PA 09/04/23 0309    Dione Booze, MD 09/04/23 [phone]

## 2023-09-04 NOTE — ED Notes (Signed)
Discharge instructions reviewed.   Encouraged to follow up with specialist as directed.   Opportunity for questions and concerns provided.   Alert, oriented and escorted out via wheelchair.   Declined discharge vital signs.

## 2023-11-26 ENCOUNTER — Ambulatory Visit: Admitting: Student

## 2023-12-16 ENCOUNTER — Ambulatory Visit

## 2023-12-16 ENCOUNTER — Other Ambulatory Visit: Payer: Self-pay

## 2023-12-16 ENCOUNTER — Other Ambulatory Visit (HOSPITAL_COMMUNITY)
Admission: RE | Admit: 2023-12-16 | Discharge: 2023-12-16 | Disposition: A | Discharge status: Home / Self Care | Source: Ambulatory Visit | Attending: Family Medicine | Admitting: Family Medicine

## 2023-12-16 VITALS — BP 136/96 | HR 61 | Ht 65.0 in | Wt 219.5 lb

## 2023-12-16 DIAGNOSIS — Z113 Encounter for screening for infections with a predominantly sexual mode of transmission: Secondary | ICD-10-CM | POA: Diagnosis present

## 2023-12-16 DIAGNOSIS — N898 Other specified noninflammatory disorders of vagina: Secondary | ICD-10-CM | POA: Diagnosis present

## 2023-12-16 NOTE — Progress Notes (Signed)
 Pt presents with report of vaginal discharge and odor which began 2 weeks ago. She also desires testing for STI's. Self swab obtained.  Pt advised she will be notified of test results as well as any treatment indicated via Mychart.  Pt stated that she has intolerance to Metronidazole  and prefers Clindamycin  if BV is present and requires treatment. Pt has elevated BP today. She denies H/A or visual disturbances and stated she has upcoming appointment with PCP for BP evaluation.  She voiced understanding of all information and instructions given.

## 2023-12-18 LAB — CERVICOVAGINAL ANCILLARY ONLY
Bacterial Vaginitis (gardnerella): POSITIVE — AB
Candida Glabrata: NEGATIVE
Candida Vaginitis: NEGATIVE
Chlamydia: NEGATIVE
Comment: NEGATIVE
Comment: NEGATIVE
Comment: NEGATIVE
Comment: NEGATIVE
Comment: NEGATIVE
Comment: NORMAL
Neisseria Gonorrhea: NEGATIVE
Trichomonas: NEGATIVE

## 2023-12-21 ENCOUNTER — Ambulatory Visit (HOSPITAL_COMMUNITY): Payer: Self-pay | Admitting: Physician Assistant

## 2023-12-21 DIAGNOSIS — B9689 Other specified bacterial agents as the cause of diseases classified elsewhere: Secondary | ICD-10-CM

## 2023-12-22 NOTE — Telephone Encounter (Addendum)
-----   Message from Luevenia Saha sent at 12/21/2023  8:39 AM EDT ----- Hi team, could we please send Seda treatment for BV? She is allergic to flagyl .   Thank you,  [name]   ---------------  Called patient to review treatment options. She requests oral clindamycin  treatment. Explained this is not on our protocol so I will need to review with provider. Message sent to [facility], [place].

## 2023-12-23 MED ORDER — CLINDAMYCIN HCL 300 MG PO CAPS: 300.0000 mg | ORAL_CAPSULE | Freq: Two times a day (BID) | ORAL | 0 refills | Status: DC

## 2023-12-23 NOTE — Addendum Note (Signed)
 Addended by: Ida Mains E on: 12/23/2023 03:11 PM   Modules accepted: Orders

## 2023-12-23 NOTE — Telephone Encounter (Signed)
 Gail Brewer, if she would like the oral clinda, we can do it, but she should be counseled that it increases risk of C.Diff-associated diarrhea, and so it is not our first-line therapy. My recommendation is clindamycin  cream 2% vaginally daily for seven days.   [name]   -------------  Called patient to review risk of C. Diff. Explained [name] would recommend clindamycin  vaginal ointment. Patient would prefer oral clindamycin . Will monitor for any diarrhea. Prescription sent.

## 2024-01-07 NOTE — Progress Notes (Signed)
 Patient ID: Gail Brewer, female    DOB: 1975/09/11, 48 y.o.   MRN: [id number]  No chief complaint on file.   No diagnosis found.   History of Present Illness: Gail Brewer is a 48 y.o.  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 .  She presents for preoperative evaluation for upcoming procedure, liposuction with fat grafting to the left breast, scheduled for 02/12/2024 with Dr. Carolynne Citron.  The patient {HAS HAS [id number]} had problems with anesthesia.  *** Patient denies any personal or family history of breast cancer.  She denies any history of cardiac disease.  She reports she does not take any blood thinners.  Patient reports she is not a smoker.  ***Patient denies taking any birth control or hormone replacement.  She does have history of 1 miscarriage.  She denies any personal or family history of blood clots or clotting diseases.  She does not report any recent surgeries, traumas or infections.  She does not have any history of Crohn's disease, ulcerative colitis, COPD or asthma.  She does not have history of cancer.  Summary of Previous Visit: ***  Job: ***  PMH Significant for: Status post bilateral breast reduction, necrosis of entire skin flap, nipple areolar necrosis, endometriosis   Past Medical History: Allergies: Allergies  Allergen Reactions   Flagyl  [Metronidazole ] Other (See Comments)    Yeast infection    Current Medications:  Current Outpatient Medications:    clindamycin  (CLEOCIN ) 300 MG capsule, Take 1 capsule (300 mg total) by mouth 2 (two) times daily., Disp: 14 capsule, Rfl: 0   cloNIDine  (CATAPRES ) 0.1 MG tablet, TAKE 1 TABLET(0.1 MG) BY MOUTH TWICE DAILY AS NEEDED (Patient not taking: Reported on 12/16/2023), Disp: 60 tablet, Rfl: 2   ibuprofen  (ADVIL ) 200 MG tablet, Take 200 mg by mouth every 6 (six) hours as needed., Disp: , Rfl:    Multiple Vitamin (MULTIVITAMIN) capsule,  Take 1 capsule by mouth daily., Disp: , Rfl:   Past Medical Problems: Past Medical History:  Diagnosis Date   Anemia    Anxiety    COVID 07/2021   mild   Depression    Encounter for screening for other metabolic disorders 12/09/2022   Heart murmur    asymptomatic   Open wound of left breast 08/07/2022    Past Surgical History: Past Surgical History:  Procedure Laterality Date   APPLICATION OF WOUND VAC Left 08/14/2022   Procedure: APPLICATION OF WOUND VAC;  Surgeon: Teretha Ferguson, MD;  Location: [facility] OR;  Service: Plastics;  Laterality: Left;   APPLICATION OF WOUND VAC Left 08/18/2022   Procedure: APPLICATION OF WOUND VAC;  Surgeon: Teretha Ferguson, MD;  Location: [facility] OR;  Service: Plastics;  Laterality: Left;   BREAST IMPLANT REMOVAL Left 07/22/2022   Procedure: DEBRIDEMENT LEFT BREAST;  Surgeon: Alger Infield, MD;  Location: [facility];  Service: Plastics;  Laterality: Left;   BREAST REDUCTION SURGERY Bilateral 07/11/2022   Procedure: MAMMARY REDUCTION  (BREAST);  Surgeon: Alger Infield, MD;  Location: [facility];  Service: Plastics;  Laterality: Bilateral;   CESAREAN SECTION     x 2   CESAREAN SECTION  07/24/2011   Procedure: CESAREAN SECTION;  Surgeon: Milford Allegra, MD;  Location: [facility];  Service: Gynecology;  Laterality: N/A;  Repeat Cesarian Section   IRRIGATION AND DEBRIDEMENT OF WOUND WITH SPLIT THICKNESS SKIN GRAFT Left 08/14/2022   Procedure:  Washout and placement of wound vac;  Surgeon: Teretha Ferguson, MD;  Location: [facility];  Service: Plastics;  Laterality: Left;   PRIMARY CLOSURE Left 10/03/2022   Procedure: lifting skin flaps on left breast for reapproximation and primary closure;  Surgeon: Teretha Ferguson, MD;  Location: [facility];  Service: Plastics;  Laterality: Left;   REDUCTION MAMMAPLASTY     RESECTION OF ABDOMINAL MASS Right 11/20/2021   Procedure: EXCISION RIGHT LOWER ABDOMINAL WALL MASS;   Surgeon: Oza Blumenthal, MD;  Location: [facility];  Service: General;  Laterality: Right;    Social History: Social History   Socioeconomic History   Marital status: Divorced    Spouse name: Not on file   Number of children: Not on file   Years of education: Not on file   Highest education level: Not on file  Occupational History   Not on file  Tobacco Use   Smoking status: Former    Types: Cigars   Smokeless tobacco: Never  Vaping Use   Vaping status: Never Used  Substance and Sexual Activity   Alcohol use: Yes    Alcohol/week: 1.0 standard drink of alcohol    Types: 1 Standard drinks or equivalent per week   Drug use: No   Sexual activity: Not Currently    Birth control/protection: None  Other Topics Concern   Not on file  Social History Narrative   Not on file   Social Drivers of Health   Financial Resource Strain: Not on file  Food Insecurity: No Food Insecurity (08/18/2022)   Hunger Vital Sign    Worried About Running Out of Food in the Last Year: Never true    Ran Out of Food in the Last Year: Never true  Transportation Needs: No Transportation Needs (08/18/2022)   PRAPARE - Administrator, Civil Service (Medical): No    Lack of Transportation (Non-Medical): No  Physical Activity: Not on file  Stress: Not on file  Social Connections: Not on file  Intimate Partner Violence: Unknown (08/18/2022)   Humiliation, Afraid, Rape, and Kick questionnaire    Fear of Current or Ex-Partner: No    Emotionally Abused: No    Physically Abused: No    Sexually Abused: Not on file    Family History: Family History  Problem Relation Age of Onset   Kidney failure Mother    Breast cancer Paternal Grandmother     Review of Systems: ROS  Physical Exam: Vital Signs There were no vitals taken for this visit.  Physical Exam  Constitutional:      General: Not in acute distress.    Appearance: Normal appearance. Not ill-appearing.  HENT:      Head: Normocephalic and atraumatic.  Neck:     Musculoskeletal: Normal range of motion.  Cardiovascular:     Rate and Rhythm: Normal rate Pulmonary:     Effort: Pulmonary effort is normal. No respiratory distress.  Abdominal:     General: Abdomen is flat. There is no distension.  Musculoskeletal: Normal range of motion.  Skin:    General: Skin is warm and dry.     Findings: No erythema or rash.  Neurological:     Mental Status: Alert and oriented to person, place, and time. Mental status is at baseline.  Psychiatric:        Mood and Affect: Mood normal.        Behavior: Behavior normal.    Assessment/Plan: The patient is scheduled for  liposuction with fat grafting to the left breast with Dr. Carolynne Citron.  Risks, benefits, and alternatives of procedure discussed, questions answered and consent obtained.    Smoking Status: ***; Counseling Given? *** Last Mammogram: 06/04/2023; Results: BI-RADS Category 1 negative  Caprini Score: ***; Risk Factors include: ***, BMI *** 25, and length of planned surgery. Recommendation for mechanical *** prophylaxis. Encourage early ambulation.   Pictures obtained: @consult ***  Post-op Rx sent to pharmacy: {Blank:[id number]::Oxycodone , Zofran , Keflex,Oxycodone , Zofran }  Patient was provided with the *** General Surgical Risk consent document and Pain Medication Agreement prior to their appointment.  They had adequate time to read through the risk consent documents and Pain Medication Agreement. We also discussed them in person together during this preop appointment. All of their questions were answered to their satisfaction.  Recommended calling if they have any further questions.  Risk consent form and Pain Medication Agreement to be scanned into patient's chart.  The consent was obtained with risks and complications reviewed which included bleeding, pain, scar, infection and the risk of anesthesia.  The patients questions were answered to the patients  expressed satisfaction.    Electronically signed by: Harden Leyden, PA-C 01/07/2024 10:18 AM

## 2024-01-08 ENCOUNTER — Ambulatory Visit (INDEPENDENT_AMBULATORY_CARE_PROVIDER_SITE_OTHER): Admitting: Student

## 2024-01-08 VITALS — BP 119/81 | HR 76 | Wt 218.0 lb

## 2024-01-08 DIAGNOSIS — Z9889 Other specified postprocedural states: Secondary | ICD-10-CM

## 2024-01-08 DIAGNOSIS — T8131XS Disruption of external operation (surgical) wound, not elsewhere classified, sequela: Secondary | ICD-10-CM

## 2024-01-15 ENCOUNTER — Telehealth: Payer: Self-pay | Admitting: Plastic Surgery

## 2024-01-15 NOTE — Telephone Encounter (Signed)
 Patient would like to reschedule to either the 22nd or the 29th, if not available she would like to keep current, please let her know

## 2024-02-05 ENCOUNTER — Ambulatory Visit: Admitting: Student

## 2024-02-11 ENCOUNTER — Other Ambulatory Visit: Payer: Self-pay

## 2024-02-11 ENCOUNTER — Encounter (HOSPITAL_BASED_OUTPATIENT_CLINIC_OR_DEPARTMENT_OTHER): Payer: Self-pay | Admitting: Plastic Surgery

## 2024-02-11 NOTE — Anesthesia Preprocedure Evaluation (Signed)
 Anesthesia Evaluation  Patient identified by MRN, date of birth, ID band Patient awake    Reviewed: Allergy & Precautions, NPO status , Patient's Chart, lab work & pertinent test results  History of Anesthesia Complications Negative for: history of anesthetic complications  Airway Mallampati: II  TM Distance: >3 FB Neck ROM: Full   Comment: Previous grade I view with MAC 3, easy mask Dental  (+) Dental Advisory Given   Pulmonary neg shortness of breath, neg sleep apnea, neg COPD, neg recent URI, former smoker   Pulmonary exam normal breath sounds clear to auscultation       Cardiovascular (-) hypertension(-) angina (-) Past MI, (-) Cardiac Stents and (-) CABG (-) dysrhythmias + Valvular Problems/Murmurs  Rhythm:Regular Rate:Normal     Neuro/Psych  PSYCHIATRIC DISORDERS Anxiety Depression    negative neurological ROS     GI/Hepatic negative GI ROS, Neg liver ROS,,,  Endo/Other  negative endocrine ROS    Renal/GU negative Renal ROS     Musculoskeletal   Abdominal  (+) + obese  Peds  Hematology  (+) Blood dyscrasia, anemia   Anesthesia Other Findings   Reproductive/Obstetrics                              Anesthesia Physical Anesthesia Plan  ASA: 2  Anesthesia Plan: General   Post-op Pain Management: Tylenol  PO (pre-op)*   Induction: Intravenous  PONV Risk Score and Plan: 3 and Ondansetron , Dexamethasone , Midazolam  and Treatment may vary due to age or medical condition  Airway Management Planned: Oral ETT  Additional Equipment:   Intra-op Plan:   Post-operative Plan: Extubation in OR  Informed Consent: I have reviewed the patients History and Physical, chart, labs and discussed the procedure including the risks, benefits and alternatives for the proposed anesthesia with the patient or authorized representative who has indicated his/her understanding and acceptance.     Dental  advisory given  Plan Discussed with: CRNA and Anesthesiologist  Anesthesia Plan Comments: (Risks of general anesthesia discussed including, but not limited to, sore throat, hoarse voice, chipped/damaged teeth, injury to vocal cords, nausea and vomiting, allergic reactions, lung infection, heart attack, stroke, and death. All questions answered. )         Anesthesia Quick Evaluation

## 2024-02-12 ENCOUNTER — Ambulatory Visit (HOSPITAL_BASED_OUTPATIENT_CLINIC_OR_DEPARTMENT_OTHER): Payer: Self-pay | Admitting: Anesthesiology

## 2024-02-12 ENCOUNTER — Other Ambulatory Visit: Payer: Self-pay | Admitting: Surgical

## 2024-02-12 ENCOUNTER — Ambulatory Visit (HOSPITAL_BASED_OUTPATIENT_CLINIC_OR_DEPARTMENT_OTHER)
Admission: RE | Admit: 2024-02-12 | Discharge: 2024-02-12 | Disposition: A | Discharge status: Home / Self Care | Attending: Plastic Surgery | Admitting: Plastic Surgery

## 2024-02-12 ENCOUNTER — Encounter (HOSPITAL_BASED_OUTPATIENT_CLINIC_OR_DEPARTMENT_OTHER): Payer: Self-pay | Admitting: Plastic Surgery

## 2024-02-12 ENCOUNTER — Other Ambulatory Visit: Payer: Self-pay

## 2024-02-12 ENCOUNTER — Encounter (HOSPITAL_BASED_OUTPATIENT_CLINIC_OR_DEPARTMENT_OTHER)
Admission: RE | Disposition: A | Discharge status: Home / Self Care | Payer: Self-pay | Source: Home / Self Care | Attending: Plastic Surgery

## 2024-02-12 DIAGNOSIS — Z01818 Encounter for other preprocedural examination: Secondary | ICD-10-CM

## 2024-02-12 DIAGNOSIS — N651 Disproportion of reconstructed breast: Secondary | ICD-10-CM

## 2024-02-12 DIAGNOSIS — N6489 Other specified disorders of breast: Secondary | ICD-10-CM | POA: Diagnosis present

## 2024-02-12 HISTORY — PX: LIPOSUCTION WITH LIPOFILLING: SHX6436

## 2024-02-12 LAB — POCT PREGNANCY, URINE: Preg Test, Ur: NEGATIVE

## 2024-02-12 SURGERY — LIPOSUCTION, WITH FAT TRANSFER
Anesthesia: General | Site: Breast | Laterality: Left

## 2024-02-12 MED ORDER — LIDOCAINE 2% (20 MG/ML) 5 ML SYRINGE
INTRAMUSCULAR | Status: DC | PRN
  Administered 2024-02-12: 100 mg via INTRAVENOUS

## 2024-02-12 MED ORDER — PROPOFOL 10 MG/ML IV BOLUS
INTRAVENOUS | Status: AC
Start: 2024-02-12 — End: 2024-02-12
  Filled 2024-02-12: qty 20

## 2024-02-12 MED ORDER — OXYCODONE HCL 5 MG PO TABS: 5.0000 mg | ORAL_TABLET | Freq: Once | ORAL | Status: DC | PRN

## 2024-02-12 MED ORDER — DEXAMETHASONE SODIUM PHOSPHATE 4 MG/ML IJ SOLN
INTRAMUSCULAR | Status: DC | PRN
  Administered 2024-02-12: 5 mg via INTRAVENOUS

## 2024-02-12 MED ORDER — MIDAZOLAM HCL 2 MG/2ML IJ SOLN
INTRAMUSCULAR | Status: AC
  Filled 2024-02-12: qty 2

## 2024-02-12 MED ORDER — LIDOCAINE HCL 1 % IJ SOLN
INTRAVENOUS | Status: DC | PRN
  Administered 2024-02-12: 600 mL

## 2024-02-12 MED ORDER — CEFAZOLIN SODIUM-DEXTROSE 2-4 GM/100ML-% IV SOLN
2.0000 g | INTRAVENOUS | Status: AC
Start: 2024-02-12 — End: 2024-02-12
  Administered 2024-02-12: 2 g via INTRAVENOUS

## 2024-02-12 MED ORDER — ACETAMINOPHEN 500 MG PO TABS
ORAL_TABLET | ORAL | Status: AC
  Filled 2024-02-12: qty 2

## 2024-02-12 MED ORDER — ONDANSETRON HCL 4 MG PO TABS: 4.0000 mg | ORAL_TABLET | Freq: Three times a day (TID) | ORAL | 0 refills | Status: DC | PRN

## 2024-02-12 MED ORDER — DEXAMETHASONE SODIUM PHOSPHATE 10 MG/ML IJ SOLN
INTRAMUSCULAR | Status: AC
Start: 2024-02-12 — End: 2024-02-12
  Filled 2024-02-12: qty 1

## 2024-02-12 MED ORDER — PHENYLEPHRINE 80 MCG/ML (10ML) SYRINGE FOR IV PUSH (FOR BLOOD PRESSURE SUPPORT)
PREFILLED_SYRINGE | INTRAVENOUS | Status: AC
  Filled 2024-02-12: qty 10

## 2024-02-12 MED ORDER — ROCURONIUM BROMIDE 10 MG/ML (PF) SYRINGE
PREFILLED_SYRINGE | INTRAVENOUS | Status: AC
  Filled 2024-02-12: qty 10

## 2024-02-12 MED ORDER — PROPOFOL 10 MG/ML IV BOLUS
INTRAVENOUS | Status: DC | PRN
  Administered 2024-02-12: 200 mg via INTRAVENOUS
  Administered 2024-02-12: 100 mg via INTRAVENOUS
  Administered 2024-02-12: 50 mg via INTRAVENOUS
  Administered 2024-02-12: 200 mg via INTRAVENOUS

## 2024-02-12 MED ORDER — FENTANYL CITRATE (PF) 100 MCG/2ML IJ SOLN: 25.0000 ug | INTRAMUSCULAR | Status: DC | PRN

## 2024-02-12 MED ORDER — ACETAMINOPHEN 500 MG PO TABS
1000.0000 mg | ORAL_TABLET | Freq: Once | ORAL | Status: AC
  Administered 2024-02-12: 1000 mg via ORAL

## 2024-02-12 MED ORDER — LACTATED RINGERS IV SOLN: INTRAVENOUS | Status: DC

## 2024-02-12 MED ORDER — ONDANSETRON HCL 4 MG/2ML IJ SOLN
INTRAMUSCULAR | Status: AC
  Filled 2024-02-12: qty 2

## 2024-02-12 MED ORDER — LIDOCAINE HCL (PF) 1 % IJ SOLN
INTRAMUSCULAR | Status: AC
Start: 2024-02-12 — End: 2024-02-12
  Filled 2024-02-12: qty 60

## 2024-02-12 MED ORDER — EPINEPHRINE PF 1 MG/ML IJ SOLN
INTRAMUSCULAR | Status: AC
  Filled 2024-02-12: qty 1

## 2024-02-12 MED ORDER — LIDOCAINE 2% (20 MG/ML) 5 ML SYRINGE
INTRAMUSCULAR | Status: AC
  Filled 2024-02-12: qty 5

## 2024-02-12 MED ORDER — CEFAZOLIN SODIUM-DEXTROSE 2-4 GM/100ML-% IV SOLN
INTRAVENOUS | Status: AC
Start: 2024-02-12 — End: 2024-02-12
  Filled 2024-02-12: qty 100

## 2024-02-12 MED ORDER — CHLORHEXIDINE GLUCONATE CLOTH 2 % EX PADS: 6.0000 | MEDICATED_PAD | Freq: Once | CUTANEOUS | Status: DC

## 2024-02-12 MED ORDER — OXYCODONE HCL 5 MG PO CAPS: 5.0000 mg | ORAL_CAPSULE | Freq: Four times a day (QID) | ORAL | 0 refills | Status: DC | PRN

## 2024-02-12 MED ORDER — MIDAZOLAM HCL 5 MG/5ML IJ SOLN
INTRAMUSCULAR | Status: DC | PRN
  Administered 2024-02-12: 2 mg via INTRAVENOUS

## 2024-02-12 MED ORDER — OXYCODONE HCL 5 MG PO TABS: 5.0000 mg | ORAL_TABLET | Freq: Four times a day (QID) | ORAL | 0 refills | Status: AC | PRN

## 2024-02-12 MED ORDER — GLYCOPYRROLATE PF 0.2 MG/ML IJ SOSY
PREFILLED_SYRINGE | INTRAMUSCULAR | Status: AC
  Filled 2024-02-12: qty 1

## 2024-02-12 MED ORDER — ONDANSETRON HCL 4 MG/2ML IJ SOLN
INTRAMUSCULAR | Status: DC | PRN
  Administered 2024-02-12: 4 mg via INTRAVENOUS

## 2024-02-12 MED ORDER — GLYCOPYRROLATE 0.2 MG/ML IJ SOLN
INTRAMUSCULAR | Status: DC | PRN
  Administered 2024-02-12: .2 mg via INTRAVENOUS

## 2024-02-12 MED ORDER — FENTANYL CITRATE (PF) 100 MCG/2ML IJ SOLN
INTRAMUSCULAR | Status: AC
  Filled 2024-02-12: qty 2

## 2024-02-12 MED ORDER — FENTANYL CITRATE (PF) 100 MCG/2ML IJ SOLN
INTRAMUSCULAR | Status: AC
Start: 2024-02-12 — End: 2024-02-12
  Filled 2024-02-12: qty 2

## 2024-02-12 MED ORDER — EPHEDRINE 5 MG/ML INJ
INTRAVENOUS | Status: AC
Start: 2024-02-12 — End: 2024-02-12
  Filled 2024-02-12: qty 5

## 2024-02-12 MED ORDER — AMISULPRIDE (ANTIEMETIC) 5 MG/2ML IV SOLN: 10.0000 mg | Freq: Once | INTRAVENOUS | Status: DC | PRN

## 2024-02-12 MED ORDER — OXYCODONE HCL 5 MG/5ML PO SOLN: 5.0000 mg | Freq: Once | ORAL | Status: DC | PRN

## 2024-02-12 MED ORDER — PHENYLEPHRINE HCL (PRESSORS) 10 MG/ML IV SOLN
INTRAVENOUS | Status: DC | PRN
  Administered 2024-02-12 (×9): 80 ug via INTRAVENOUS

## 2024-02-12 MED ORDER — FENTANYL CITRATE (PF) 100 MCG/2ML IJ SOLN
INTRAMUSCULAR | Status: DC | PRN
  Administered 2024-02-12: 50 ug via INTRAVENOUS
  Administered 2024-02-12: 100 ug via INTRAVENOUS
  Administered 2024-02-12: 50 ug via INTRAVENOUS

## 2024-02-12 SURGICAL SUPPLY — 56 items
BINDER ABDOMINAL 10 UNV 27-48 (MISCELLANEOUS) IMPLANT
BINDER ABDOMINAL 12 SM 30-45 (SOFTGOODS) IMPLANT
BINDER BREAST LRG (GAUZE/BANDAGES/DRESSINGS) IMPLANT
BINDER BREAST XLRG (GAUZE/BANDAGES/DRESSINGS) IMPLANT
BINDER BREAST XXLRG (GAUZE/BANDAGES/DRESSINGS) IMPLANT
BLADE SURG 10 STRL SS (BLADE) IMPLANT
BLADE SURG 11 STRL SS (BLADE) ×1 IMPLANT
BLADE SURG 15 STRL LF DISP TIS (BLADE) IMPLANT
BNDG ELASTIC 6X10 VLCR STRL LF (GAUZE/BANDAGES/DRESSINGS) IMPLANT
CANISTER SUCT 1200ML W/VALVE (MISCELLANEOUS) IMPLANT
DERMABOND ADVANCED .7 DNX12 (GAUZE/BANDAGES/DRESSINGS) IMPLANT
DRAPE UTILITY XL STRL (DRAPES) ×1 IMPLANT
DRSG MEPILEX POST OP 4X8 (GAUZE/BANDAGES/DRESSINGS) IMPLANT
ELECT COATED BLADE 2.86 ST (ELECTRODE) IMPLANT
ELECTRODE REM PT RTRN 9FT ADLT (ELECTROSURGICAL) ×1 IMPLANT
EXTRACTOR CANIST REVOLVE STRL (CANNISTER) ×1 IMPLANT
GAUZE PAD ABD 8X10 STRL (GAUZE/BANDAGES/DRESSINGS) ×2 IMPLANT
GLOVE BIO SURGEON STRL SZ8 (GLOVE) ×2 IMPLANT
GLOVE BIOGEL M STRL SZ7.5 (GLOVE) ×1 IMPLANT
GLOVE BIOGEL PI IND STRL 7.0 (GLOVE) IMPLANT
GLOVE BIOGEL PI IND STRL 7.5 (GLOVE) ×1 IMPLANT
GLOVE BIOGEL PI IND STRL 8 (GLOVE) ×2 IMPLANT
GLOVE SURG SS PI 6.5 STRL IVOR (GLOVE) IMPLANT
GOWN STRL REUS W/ TWL LRG LVL3 (GOWN DISPOSABLE) ×2 IMPLANT
GOWN STRL REUS W/ TWL XL LVL3 (GOWN DISPOSABLE) ×1 IMPLANT
GOWN STRL REUS W/TWL XL LVL3 (GOWN DISPOSABLE) ×1 IMPLANT
HIBICLENS CHG 4% 4OZ BTL (MISCELLANEOUS) ×1 IMPLANT
LINER CANISTER 1000CC FLEX (MISCELLANEOUS) ×1 IMPLANT
NDL SAFETY ECLIPSE 18X1.5 (NEEDLE) ×1 IMPLANT
NS IRRIG 1000ML POUR BTL (IV SOLUTION) IMPLANT
PACK BASIN DAY SURGERY FS (CUSTOM PROCEDURE TRAY) ×1 IMPLANT
PACK UNIVERSAL I (CUSTOM PROCEDURE TRAY) ×1 IMPLANT
PAD ALCOHOL SWAB (MISCELLANEOUS) ×1 IMPLANT
PAD FOAM SILICONE BACKED (GAUZE/BANDAGES/DRESSINGS) IMPLANT
PENCIL SMOKE EVACUATOR (MISCELLANEOUS) IMPLANT
SHEET MEDIUM DRAPE 40X70 STRL (DRAPES) IMPLANT
SLEEVE SCD COMPRESS KNEE MED (STOCKING) ×1 IMPLANT
SPIKE FLUID TRANSFER (MISCELLANEOUS) IMPLANT
SPONGE T-LAP 18X18 ~~LOC~~+RFID (SPONGE) ×1 IMPLANT
STAPLER SKIN PROX WIDE 3.9 (STAPLE) IMPLANT
SUT CHROMIC 5 0 RB 1 27 (SUTURE) ×1 IMPLANT
SUT MNCRL AB 4-0 PS2 18 (SUTURE) IMPLANT
SUT VIC AB 3-0 SH 27X BRD (SUTURE) IMPLANT
SYR 10ML LL (SYRINGE) ×3 IMPLANT
SYR 50ML LL SCALE MARK (SYRINGE) IMPLANT
SYR BULB IRRIG 60ML STRL (SYRINGE) IMPLANT
SYR CONTROL 10ML LL (SYRINGE) IMPLANT
SYR TB 1ML LL NO SAFETY (SYRINGE) ×1 IMPLANT
SYRINGE TOOMEY IRRIG 70ML (MISCELLANEOUS) IMPLANT
TOWEL GREEN STERILE FF (TOWEL DISPOSABLE) ×2 IMPLANT
TRAY DSU PREP LF (CUSTOM PROCEDURE TRAY) ×1 IMPLANT
TUBE CONNECTING 20X1/4 (TUBING) IMPLANT
TUBING INFILTRATION IT-10001 (TUBING) ×1 IMPLANT
TUBING SET GRADUATE ASPIR 12FT (MISCELLANEOUS) ×1 IMPLANT
UNDERPAD 30X36 HEAVY ABSORB (UNDERPADS AND DIAPERS) ×2 IMPLANT
YANKAUER SUCT BULB TIP NO VENT (SUCTIONS) IMPLANT

## 2024-02-12 NOTE — Anesthesia Postprocedure Evaluation (Signed)
 Anesthesia Post Note  Patient: Gail Brewer  Procedure(s) Performed: LIPOSUCTION WITH FAT TRANSFER TO LEFT BREAST (Left: Breast)     Patient location during evaluation: PACU Anesthesia Type: General Level of consciousness: awake and alert, oriented and patient cooperative Pain management: pain level controlled Vital Signs Assessment: post-procedure vital signs reviewed and stable Respiratory status: spontaneous breathing, nonlabored ventilation and respiratory function stable Cardiovascular status: blood pressure returned to baseline and stable Postop Assessment: no apparent nausea or vomiting and adequate PO intake Anesthetic complications: no   No notable events documented.  Last Vitals:  Vitals:   02/12/24 1206 02/12/24 1215  BP:  (!) 150/95  Pulse: 62 67  Resp: 13 17  Temp:    SpO2: 100% 100%    Last Pain:  Vitals:   02/12/24 1215  TempSrc:   PainSc: 0-No pain                 Romanita Fager,E. Verlon Pischke

## 2024-02-12 NOTE — Discharge Instructions (Addendum)
 INSTRUCTIONS FOR AFTER BREAST SURGERY   You will likely have some questions about what to expect following your operation.  The following information will help you and your family understand what to expect when you are discharged from the hospital.  It is important to follow these guidelines to help ensure a smooth recovery and reduce complication.  Postoperative instructions include information on: diet, wound care, medications and physical activity.  AFTER SURGERY Expect to go home after the procedure.  DIET Breast surgery does not require a specific diet.  However, the healthier you eat the better your body will heal. It is important to increasing your protein intake.  This means limiting the foods with sugar and carbohydrates.  Focus on vegetables and some meat.  If you have liposuction during your procedure be sure to drink water.  If your urine is bright yellow, then it is concentrated, and you need to drink more water.  As a general rule after surgery, you should have 8 ounces of water every hour while awake.  If you find you are persistently nauseated or unable to take in liquids let us  know.  NO TOBACCO USE or EXPOSURE.  This will slow your healing process and lead to a wound.  WOUND CARE Leave the ABDOMINAL binder on at all times except when showering . Use fragrance free soap like Dial, Dove or [place].   NO BREAST BINDER OR SPORTS BRA recommended this weekend. After 24 hours you can remove the binder to shower. Once dry apply binder or sports bra. No baths, pools or hot tubs for four weeks. We close your incision to leave the smallest and best-looking scar. No ointment or creams on your incisions for four weeks.  No Neosporin (Too many skin reactions).  A few weeks after surgery you can use Mederma and start massaging the scar. We ask you to wear your binder or sports bra for the first 6 weeks around the clock, including while sleeping. This provides added comfort and helps reduce the  fluid accumulation at the surgery site. NO Ice or heating pads to the operative site.  You have a very high risk of a BURN before you feel the temperature change. Continue to empty, recharge, & record drainage from drains 2-3 times a day, as needed.   ACTIVITY No heavy lifting until cleared by the doctor.  This usually means no more than a half-gallon of milk.  It is OK to walk and climb stairs. Moving your legs is very important to decrease your risk of a blood clot.  It will also help keep you from getting deconditioned.  Every 1 to 2 hours get up and walk for 5 minutes. This will help with a quicker recovery back to normal.  Let pain be your guide so you don't do too much.  This time is for you to recover.  You will be more comfortable if you sleep and rest with your head elevated either with a few pillows under you or in a recliner.  No stomach sleeping for a three months.  WORK Everyone returns to work at different times. As a rough guide, most people take at least 1 - 2 weeks off prior to returning to work. If you need documentation for your job, give the forms to the front staff at the clinic.  DRIVING Arrange for someone to bring you home from the hospital after your surgery.  You may be able to drive a few days after surgery but not while taking  any narcotics or valium.  BOWEL MOVEMENTS Constipation can occur after anesthesia and while taking pain medication.  It is important to stay ahead for your comfort.  We recommend taking Milk of Magnesia (2 tablespoons; twice a day) while taking the pain pills.  MEDICATIONS You may be prescribed should start after surgery At your preoperative visit for you history and physical you may have been given the following medications: Zofran  4 mg:  This is to treat nausea and vomiting.  You can take this every 6 hours as needed and only if needed. Oxycodone  5 mg:  This is only to be used after you have taken the Motrin  or the Tylenol . Every 8 hours as  needed.   Over the counter Medication to take: Ibuprofen  (Motrin ) 600 mg:  Take this every 6 hours.  If you have additional pain then take 500 mg of the Tylenol  every 8 hours.  Only take the Norco after you have tried these two. No Tylenol  until after 2:00pm today, if needed.  MiraLAX  or Milk of Magnesia: Take this according to the bottle if you take the Norco.  WHEN TO CALL Call your surgeon's office if any of the following occur: Fever 101 degrees F or greater Excessive bleeding or fluid from the incision site. Pain that increases over time without aid from the medications Redness, warmth, or pus draining from incision sites Persistent nausea or inability to take in liquids Severe misshapen area that underwent the operation.  Here are some resources for breast cancer patients:  Plastic surgery website: https://www.plasticsurgery.org/for-medical-professionals/education-and-resources/publications/breast-reconstruction-magazine Breast Reconstruction Awareness Campaign:  ChessContest.fr Plastic surgery Implant information:  https://www.plasticsurgery.org/patient-safety/breast-implant-safety   Post Anesthesia Home Care Instructions  Activity: Get plenty of rest for the remainder of the day. A responsible individual must stay with you for 24 hours following the procedure.  For the next 24 hours, DO NOT: -Drive a car -Advertising copywriter -Drink alcoholic beverages -Take any medication unless instructed by your physician -Make any legal decisions or sign important papers.  Meals: Start with liquid foods such as gelatin or soup. Progress to regular foods as tolerated. Avoid greasy, spicy, heavy foods. If nausea and/or vomiting occur, drink only clear liquids until the nausea and/or vomiting subsides. Call your physician if vomiting continues.  Special Instructions/Symptoms: Your throat may feel dry or sore from the anesthesia or the breathing tube placed in your throat  during surgery. If this causes discomfort, gargle with warm salt water. The discomfort should disappear within 24 hours.  If you had a scopolamine  patch placed behind your ear for the management of post- operative nausea and/or vomiting:  1. The medication in the patch is effective for 72 hours, after which it should be removed.  Wrap patch in a tissue and discard in the trash. Wash hands thoroughly with soap and water. 2. You may remove the patch earlier than 72 hours if you experience unpleasant side effects which may include dry mouth, dizziness or visual disturbances. 3. Avoid touching the patch. Wash your hands with soap and water after contact with the patch.     PT had tylenol  at 0806 am today. No tylenol  products till after 2:06pm today

## 2024-02-12 NOTE — Op Note (Signed)
 DATE OF OPERATION: 02/12/2024  LOCATION: [facility] operating Room  PREOPERATIVE DIAGNOSIS: Volume loss after breast reduction  POSTOPERATIVE DIAGNOSIS: Same  PROCEDURE: Fat grafting to left breast  SURGEON: Marinell Birmingham, MD  ASSISTANT: Matt Scheeler  EBL: 20 cc  CONDITION: Stable  COMPLICATIONS: None  INDICATION: The patient, Gail Brewer, is a 48 y.o. female born on [birth year], is here for treatment of breast asymmetry after nipple loss following a breast reduction.Gail Brewer   PROCEDURE DETAILS:  The patient was seen prior to surgery and marked.  The IV antibiotics were given. The patient was taken to the operating room and given a general anesthetic. A standard time out was performed and all information was confirmed by those in the room. SCDs were placed.   The abdomen and chest were prepped and draped in usual sterile manner.  The lower portion of the abdomen was infiltrated with 500 mL of tumescent fluid.  The tumescent fluid was allowed to dwell for 7 minutes and a liposuction cannula was introduced through stab incisions.  Approximately 175 mL of lipo aspirate were removed.  The lipo aspirate was captured in the revolve system.  After preparation the fat was then placed into 10 cc syringes and introduced into the lower pole of the left breast.  105 mL of purified fat were injected.  The fat was massaged to smooth the appearance of the lower pole.  All incisions were then closed with 4-0 Monocryl and the stab incisions on the breast were sealed with Dermabond.  The patient was placed in a compressive abdominal binder and Band-Aids were placed on the breast.  She was awakened from anesthesia without incident transferred to the recovery room in good condition.  All instrument needle and sponge counts reported as correct.  No complications were appreciated during the procedure. The patient was allowed to wake up and taken to recovery room in stable condition at the end of the case. The  family was notified at the end of the case.   The advanced practice practitioner (APP) assisted throughout the case.  The APP was essential in retraction and counter traction when needed to make the case progress smoothly.  This retraction and assistance made it possible to see the tissue plans for the procedure.  The assistance was needed for blood control, tissue re-approximation and assisted with closure of the incision site.

## 2024-02-12 NOTE — H&P (Signed)
 @LOGO @    Patient ID: Gail Brewer, female    DOB: [birth year], 48 y.o.   MRN: [id number]  No chief complaint on file.     ICD-10-CM   1. Pre-op testing  Z01.818 POCT Pregnancy, Urine per protocol    POCT Pregnancy, Urine per protocol       History of Present Illness: Gail Brewer is a 48 y.o.  female  with a history of nipple loss and wound dehiscence of the left breast after breast reduction.  She presents for preoperative evaluation for upcoming procedure, fat grafting to the left breast, scheduled for February 12, 2024 with Dr. Waddell.  The patient has not had problems with anesthesia.   Summary of Previous Visit: Patient underwent a breast reduction last year.  She had loss of the nipple and wound dehiscence due to ischemia.  She has been managed with wound care and is now healed.  She is scheduled for fat grafting to the left breast to improve the appearance and replace volume.    Past Medical History: Allergies: Allergies  Allergen Reactions   Flagyl  [Metronidazole ] Other (See Comments)    Yeast infection    Current Medications:  Current Facility-Administered Medications:    ceFAZolin  (ANCEF ) IVPB 2g/100 mL premix, 2 g, Intravenous, On Call to OR, Waddell Leonce NOVAK, MD   6 CHG cloth bath night before surgery, , , Once **AND** [START ON 02/13/2024] 6 CHG cloth bath AM of surgery, , , Once **AND** Chlorhexidine  Gluconate Cloth 2 % PADS 6 each, 6 each, Topical, Once **AND** Chlorhexidine  Gluconate Cloth 2 % PADS 6 each, 6 each, Topical, Once, Waddell Leonce NOVAK, MD   lactated ringers  infusion, , Intravenous, Continuous, Ellender, Bernardino SQUIBB, MD, Last Rate: 10 mL/hr at 02/12/24 0803, New Bag at 02/12/24 0803  Past Medical Problems: Past Medical History:  Diagnosis Date   Anemia    Anxiety    COVID 07/2021   mild   Depression    Encounter for screening for other metabolic disorders 12/09/2022   Heart murmur    asymptomatic   Open wound of left breast 08/07/2022    Past  Surgical History: Past Surgical History:  Procedure Laterality Date   APPLICATION OF WOUND VAC Left 08/14/2022   Procedure: APPLICATION OF WOUND VAC;  Surgeon: Waddell Leonce NOVAK, MD;  Location: [facility];  Service: Plastics;  Laterality: Left;   APPLICATION OF WOUND VAC Left 08/18/2022   Procedure: APPLICATION OF WOUND VAC;  Surgeon: Waddell Leonce NOVAK, MD;  Location: [facility];  Service: Plastics;  Laterality: Left;   BREAST IMPLANT REMOVAL Left 07/22/2022   Procedure: DEBRIDEMENT LEFT BREAST;  Surgeon: Arelia Filippo, MD;  Location: [facility];  Service: Plastics;  Laterality: Left;   BREAST REDUCTION SURGERY Bilateral 07/11/2022   Procedure: MAMMARY REDUCTION  (BREAST);  Surgeon: Arelia Filippo, MD;  Location: [facility];  Service: Plastics;  Laterality: Bilateral;   CESAREAN SECTION     x 2   CESAREAN SECTION  07/24/2011   Procedure: CESAREAN SECTION;  Surgeon: Dickey FORBES Pines, MD;  Location: [facility];  Service: Gynecology;  Laterality: N/A;  Repeat Cesarian Section   IRRIGATION AND DEBRIDEMENT OF WOUND WITH SPLIT THICKNESS SKIN GRAFT Left 08/14/2022   Procedure: Washout and placement of wound vac;  Surgeon: Waddell Leonce NOVAK, MD;  Location: [facility];  Service: Plastics;  Laterality: Left;   PRIMARY CLOSURE Left 10/03/2022   Procedure: lifting skin flaps on left breast for reapproximation and primary closure;  Surgeon: Waddell Leonce NOVAK, MD;  Location: [facility];  Service: Plastics;  Laterality: Left;   REDUCTION MAMMAPLASTY     RESECTION OF ABDOMINAL MASS Right 11/20/2021   Procedure: EXCISION RIGHT LOWER ABDOMINAL WALL MASS;  Surgeon: Vernetta Berg, MD;  Location: [facility];  Service: General;  Laterality: Right;    Social History: Social History   Socioeconomic History   Marital status: Divorced    Spouse name: Not on file   Number of children: Not on file   Years of education: Not on file   Highest education level: Not  on file  Occupational History   Not on file  Tobacco Use   Smoking status: Former    Types: Cigars   Smokeless tobacco: Never  Vaping Use   Vaping status: Never Used  Substance and Sexual Activity   Alcohol use: Yes    Alcohol/week: 1.0 standard drink of alcohol    Types: 1 Standard drinks or equivalent per week   Drug use: No   Sexual activity: Not Currently    Birth control/protection: None  Other Topics Concern   Not on file  Social History Narrative   Not on file   Social Drivers of Health   Financial Resource Strain: Not on file  Food Insecurity: No Food Insecurity (08/18/2022)   Hunger Vital Sign    Worried About Running Out of Food in the Last Year: Never true    Ran Out of Food in the Last Year: Never true  Transportation Needs: No Transportation Needs (08/18/2022)   PRAPARE - Administrator, Civil Service (Medical): No    Lack of Transportation (Non-Medical): No  Physical Activity: Not on file  Stress: Not on file  Social Connections: Not on file  Intimate Partner Violence: Unknown (08/18/2022)   Humiliation, Afraid, Rape, and Kick questionnaire    Fear of Current or Ex-Partner: No    Emotionally Abused: No    Physically Abused: No    Sexually Abused: Not on file    Family History: Family History  Problem Relation Age of Onset   Kidney failure Mother    Breast cancer Paternal Grandmother     Review of Systems: ROS  Physical Exam: Vital Signs BP (!) 133/91   Pulse 64   Temp 98 F (36.7 C) (Temporal)   Resp 20   Ht 5' 5 (1.651 m)   Wt 95.8 kg   LMP 02/04/2024 (Exact Date)   SpO2 100%   BMI 35.15 kg/m   Physical Exam  Constitutional:      General: Not in acute distress.    Appearance: Normal appearance. Not ill-appearing.  HENT:     Head: Normocephalic and atraumatic.  Eyes:     Pupils: Pupils are equal, round. Cardiovascular:     Rate and Rhythm: Normal rate.    Pulses: Normal pulses.  Pulmonary:     Effort: No respiratory  distress or increased work of breathing.  Speaks in full sentences. Abdominal:     General: Abdomen is flat. No distension.   Musculoskeletal: Normal range of motion. No lower extremity swelling or edema. No varicosities.  Breast: Well-healed wounds and incision on the left breast.  Significant breast asymmetry. Skin:    General: Skin is warm and dry.     Findings: No erythema or rash.  Neurological:     Mental Status: Alert and oriented to person, place, and time.  Psychiatric:        Mood and  Affect: Mood normal.        Behavior: Behavior normal.    Assessment/Plan: The patient is scheduled for Gail Brewer with Dr. Waddell.  Risks, benefits, and alternatives of procedure discussed, questions answered and consent obtained.    Smoking Status: Non-smoker Last Mammogram: November 2024; Results: BI-RADS 1  Caprini Score: 4; Risk Factors include: , BMI greater than 25, and length of planned surgery. Recommendation for mechanical prophylaxis. Encourage early ambulation.   Pictures obtained: Yes  Post-op Rx sent to pharmacy: Yes  Patient was provided with the  General Surgical Risk consent document and Pain Medication Agreement prior to their appointment.  They had adequate time to read through the risk consent documents and Pain Medication Agreement. We also discussed them in person together during this preop appointment. All of their questions were answered to their satisfaction.  Recommended calling if they have any further questions.  Risk consent form and Pain Medication Agreement to be scanned into patient's chart.  Patient is scheduled for fat grafting to the left breast.  She has no questions this morning.  The surgical site was marked with her concurrence.  All questions were answered to her satisfaction.  Will proceed at her request.   Electronically signed by: Leonce KATHEE Waddell, MD 02/12/2024 9:09 AM

## 2024-02-12 NOTE — Anesthesia Procedure Notes (Addendum)
 Procedure Name: LMA Insertion Date/Time: 02/12/2024 10:11 AM  Performed by: Pam Macario BROCKS, CRNAPre-anesthesia Checklist: Patient identified, Emergency Drugs available, Suction available, Patient being monitored and Timeout performed Patient Re-evaluated:Patient Re-evaluated prior to induction Oxygen Delivery Method: Circle system utilized Preoxygenation: Pre-oxygenation with 100% oxygen Induction Type: IV induction Ventilation: Mask ventilation without difficulty LMA: LMA inserted LMA Size: 4.0 Number of attempts: 1 Airway Equipment and Method: Bite block Placement Confirmation: positive ETCO2, breath sounds checked- equal and bilateral and CO2 detector Tube secured with: Tape Dental Injury: Teeth and Oropharynx as per pre-operative assessment

## 2024-02-12 NOTE — Transfer of Care (Signed)
 Immediate Anesthesia Transfer of Care Note  Patient: Gail Brewer  Procedure(s) Performed: LIPOSUCTION WITH FAT TRANSFER TO LEFT BREAST (Left: Breast)  Patient Location: PACU  Anesthesia Type:General  Level of Consciousness: awake and alert   Airway & Oxygen Therapy: Patient connected to nasal cannula oxygen  Post-op Assessment: Report given to RN  Post vital signs: Reviewed and stable  Last Vitals:  Vitals Value Taken Time  BP 137/106 02/12/24 11:45  Temp    Pulse 85 02/12/24 11:46  Resp 15 02/12/24 11:46  SpO2 100 % 02/12/24 11:46  Vitals shown include unfiled device data.  Last Pain:  Vitals:   02/12/24 0755  TempSrc: Temporal  PainSc: 0-No pain      Patients Stated Pain Goal: 3 (02/12/24 0755)  Complications: No notable events documented.

## 2024-02-12 NOTE — Progress Notes (Signed)
 Postop pain meds, pt called said pharmacy did not have capsule, only tablets.  Called and spoke with pharmacist, confirmed receipt. Confirmed cancellation of previous placed oxy capsule order.

## 2024-02-13 ENCOUNTER — Encounter (HOSPITAL_BASED_OUTPATIENT_CLINIC_OR_DEPARTMENT_OTHER): Payer: Self-pay | Admitting: Plastic Surgery

## 2024-02-18 ENCOUNTER — Encounter: Payer: Self-pay | Admitting: Plastic Surgery

## 2024-02-18 ENCOUNTER — Ambulatory Visit: Admitting: Plastic Surgery

## 2024-02-18 VITALS — BP 124/79 | HR 79 | Ht 65.0 in | Wt 211.0 lb

## 2024-02-18 DIAGNOSIS — Z9889 Other specified postprocedural states: Secondary | ICD-10-CM

## 2024-02-18 NOTE — Progress Notes (Signed)
 Gail Brewer returns today approximately 1 week after fat grafting to the left breast.  She was a little disappointed with the volume in the breast but I explained to her that I used all the fat that I could harvest from her stomach.  I personally am quite happy with the appearance of her breast.  All incisions are healing nicely.  Her anterior abdominal wall which I used for fat harvest is soft and nontender no evidence of seroma.  She is encouraged to keep her compressive garment on.  I have asked her to refrain from abdominal exercises for the next 2 to 3 weeks.  She may return to other exercises as desired.  Will follow-up in 1 month we will plan for next round of fat grafting in approximately 6 months.

## 2024-02-29 ENCOUNTER — Telehealth: Payer: Self-pay | Admitting: Plastic Surgery

## 2024-02-29 NOTE — Telephone Encounter (Signed)
 Pt can return to work, letter can say no lifting greater than 20 lbs until 03/21/2024

## 2024-02-29 NOTE — Telephone Encounter (Signed)
 She is returning to work today and says that she needs a note okaying it, but I need to know what it she say and if she has restrictions, please advise

## 2024-03-01 ENCOUNTER — Encounter: Payer: Self-pay | Admitting: Plastic Surgery

## 2024-03-08 ENCOUNTER — Telehealth: Payer: Self-pay | Admitting: Plastic Surgery

## 2024-03-08 NOTE — Telephone Encounter (Signed)
 Melissa NEEDS MORE INFORMATION [employer] document came in , but I have no referral to tie it to, please advise

## 2024-03-23 ENCOUNTER — Ambulatory Visit (INDEPENDENT_AMBULATORY_CARE_PROVIDER_SITE_OTHER): Admitting: Plastic Surgery

## 2024-03-23 ENCOUNTER — Encounter: Payer: Self-pay | Admitting: Plastic Surgery

## 2024-03-23 VITALS — BP 161/99 | HR 79 | Ht 65.0 in | Wt 215.2 lb

## 2024-03-23 DIAGNOSIS — N6489 Other specified disorders of breast: Secondary | ICD-10-CM

## 2024-03-23 DIAGNOSIS — Z9889 Other specified postprocedural states: Secondary | ICD-10-CM

## 2024-03-23 DIAGNOSIS — Z719 Counseling, unspecified: Secondary | ICD-10-CM

## 2024-03-23 NOTE — Progress Notes (Signed)
 Ms. Laplante returns today for follow-up after surgery.  She underwent fat grafting to the left breast to increase the size.  The liposuction of the anterior abdominal wall yielded less fat and I had anticipated.  She is still interested in increasing the size and will need to undergo another round of fat grafting.  Will use the flanks this time for harvesting fat.  The similar risks as last time still exist.  Bleeding infection seroma formation.  She may indeed require more fat grafting to achieve volume that she is interested in.  All questions were answered to her satisfaction.  She will need photographs at her preop appointment.  Will submit her for surgical consideration at her request.  Patient's blood pressure was elevated on this visit.  She was informed that the blood pressure remained elevated on the second check.  She was encouraged to follow-up with her primary care provider.

## 2024-06-06 ENCOUNTER — Encounter: Payer: Self-pay | Admitting: Physician Assistant

## 2024-06-06 ENCOUNTER — Ambulatory Visit (INDEPENDENT_AMBULATORY_CARE_PROVIDER_SITE_OTHER): Admitting: Physician Assistant

## 2024-06-06 VITALS — HR 69 | Ht 65.0 in | Wt 220.6 lb

## 2024-06-06 DIAGNOSIS — N651 Disproportion of reconstructed breast: Secondary | ICD-10-CM

## 2024-06-06 DIAGNOSIS — N6489 Other specified disorders of breast: Secondary | ICD-10-CM

## 2024-06-06 DIAGNOSIS — Z9889 Other specified postprocedural states: Secondary | ICD-10-CM

## 2024-06-06 MED ORDER — OXYCODONE HCL 5 MG PO TABS: 5.0000 mg | ORAL_TABLET | Freq: Three times a day (TID) | ORAL | 0 refills | Status: AC | PRN

## 2024-06-06 MED ORDER — ONDANSETRON 4 MG PO TBDP: 4.0000 mg | ORAL_TABLET | Freq: Three times a day (TID) | ORAL | 0 refills | Status: AC | PRN

## 2024-06-06 NOTE — H&P (View-Only) (Signed)
 Patient ID: Gail Brewer, female    DOB: Oct 29, 1975, 48 y.o.   MRN: [id number]  Chief Complaint  Patient presents with   Pre-op Exam           ICD-10-CM   1. Breast asymmetry  N64.89     2. S/P bilateral breast reduction  Z98.890     3. S/P breast reconstruction, left  Z98.890        History of Present Illness: Gail Brewer is a 48 y.o.  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  She presents for preoperative evaluation for upcoming procedure, liposuction of bilateral flanks with fat grafting to left breast for improved symmetry, scheduled for 06/24/2024 with Dr. Waddell.  The patient has not had problems with anesthesia.  She denies any personal or family history of blood clots or clotting disorder.  She denies any personal history of cancer, cardiac or pulmonary disease, nicotine use, or varicosities.  She states that she was recently started on hydrochlorothiazide for high blood pressure, but admits that she does not take it regularly.  She will hold any vitamins and supplements 1 week prior to surgery.  She will also hold any NSAIDs 3 days prior to surgery.  She understands that she will be due for a screening mammogram at the end of this month and will call to try and have it scheduled last week in November, prior to surgery.  If this is not obtained in advance of surgery, may still proceed per Dr. Waddell.  However, she would need to hold off on moving forward with mammogram for at least 3 months from time of surgery.  Summary of Previous Visit: Patient was seen by Dr. Waddell postoperatively after her most recent surgery 02/12/2024.  At that time, the fat grafting did not yield the results that were anticipated.  Discussed additional liposuction and fat grafting.  Discussed risks of surgery including but not limited to bleeding, infection, seroma, and persistent asymmetry.  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   Past Medical History: Allergies: Allergies  Allergen Reactions   Flagyl  [Metronidazole ] Other (See Comments)    Yeast infection    Current Medications:  Current Outpatient Medications:    ibuprofen  (ADVIL ) 200 MG tablet, Take 200 mg by mouth every 6 (six) hours as needed., Disp: , Rfl:    Multiple Vitamin (MULTIVITAMIN) capsule, Take 1 capsule by mouth daily., Disp: , Rfl:    ondansetron  (ZOFRAN -ODT) 4 MG disintegrating tablet, Take 1 tablet (4 mg total) by mouth every 8 (eight) hours as needed for nausea or vomiting., Disp: 20 tablet, Rfl: 0   oxyCODONE  (ROXICODONE ) 5 MG immediate release tablet, Take 1 tablet (5 mg total) by mouth every 8 (eight) hours as needed for up to 5 days for severe pain (pain score 7-10)., Disp: 15 tablet, Rfl: 0   cloNIDine  (CATAPRES ) 0.1 MG tablet, TAKE 1 TABLET(0.1 MG) BY MOUTH TWICE DAILY AS NEEDED (Patient not taking: Reported on 06/06/2024), Disp: 60 tablet, Rfl: 2  Past Medical Problems: Past Medical History:  Diagnosis Date   Anemia    Anxiety    COVID 07/2021   mild   Depression    Encounter for screening for other metabolic disorders 12/09/2022   Heart murmur    asymptomatic   Open wound of left breast 08/07/2022    Past  Surgical History: Past Surgical History:  Procedure Laterality Date   APPLICATION OF WOUND VAC Left 08/14/2022   Procedure: APPLICATION OF WOUND VAC;  Surgeon: Waddell Leonce NOVAK, MD;  Location: [facility];  Service: Plastics;  Laterality: Left;   APPLICATION OF WOUND VAC Left 08/18/2022   Procedure: APPLICATION OF WOUND VAC;  Surgeon: Waddell Leonce NOVAK, MD;  Location: [facility];  Service: Plastics;  Laterality: Left;   BREAST IMPLANT REMOVAL Left 07/22/2022   Procedure: DEBRIDEMENT LEFT BREAST;  Surgeon: Arelia Filippo, MD;  Location: [facility];  Service: Plastics;  Laterality: Left;    BREAST REDUCTION SURGERY Bilateral 07/11/2022   Procedure: MAMMARY REDUCTION  (BREAST);  Surgeon: Arelia Filippo, MD;  Location: [facility];  Service: Plastics;  Laterality: Bilateral;   CESAREAN SECTION     x 2   CESAREAN SECTION  07/24/2011   Procedure: CESAREAN SECTION;  Surgeon: Dickey FORBES Pines, MD;  Location: [facility];  Service: Gynecology;  Laterality: N/A;  Repeat Cesarian Section   IRRIGATION AND DEBRIDEMENT OF WOUND WITH SPLIT THICKNESS SKIN GRAFT Left 08/14/2022   Procedure: Washout and placement of wound vac;  Surgeon: Waddell Leonce NOVAK, MD;  Location: [facility];  Service: Plastics;  Laterality: Left;   LIPOSUCTION WITH LIPOFILLING Left 02/12/2024   Procedure: LIPOSUCTION WITH FAT TRANSFER TO LEFT BREAST;  Surgeon: Waddell Leonce NOVAK, MD;  Location: [facility];  Service: Plastics;  Laterality: Left;  Fat grafting to the left breast   PRIMARY CLOSURE Left 10/03/2022   Procedure: lifting skin flaps on left breast for reapproximation and primary closure;  Surgeon: Waddell Leonce NOVAK, MD;  Location: [facility];  Service: Plastics;  Laterality: Left;   REDUCTION MAMMAPLASTY     RESECTION OF ABDOMINAL MASS Right 11/20/2021   Procedure: EXCISION RIGHT LOWER ABDOMINAL WALL MASS;  Surgeon: Vernetta Berg, MD;  Location: [facility];  Service: General;  Laterality: Right;    Social History: Social History   Socioeconomic History   Marital status: Divorced    Spouse name: Not on file   Number of children: Not on file   Years of education: Not on file   Highest education level: Not on file  Occupational History   Not on file  Tobacco Use   Smoking status: Former    Types: Cigars   Smokeless tobacco: Never  Vaping Use   Vaping status: Never Used  Substance and Sexual Activity   Alcohol use: Yes    Alcohol/week: 1.0 standard drink of alcohol    Types: 1 Standard drinks or equivalent per week   Drug use: No   Sexual  activity: Not Currently    Birth control/protection: None  Other Topics Concern   Not on file  Social History Narrative   Not on file   Social Drivers of Health   Financial Resource Strain: Not on file  Food Insecurity: No Food Insecurity (08/18/2022)   Hunger Vital Sign    Worried About Running Out of Food in the Last Year: Never true    Ran Out of Food in the Last Year: Never true  Transportation Needs: No Transportation Needs (08/18/2022)   PRAPARE - Administrator, Civil Service (Medical): No    Lack of Transportation (Non-Medical): No  Physical Activity: Not on file  Stress: Not on file  Social Connections: Not on file  Intimate Partner Violence: Unknown (08/18/2022)   Humiliation, Afraid, Rape, and Kick questionnaire    Fear of  Current or Ex-Partner: No    Emotionally Abused: No    Physically Abused: No    Sexually Abused: Not on file    Family History: Family History  Problem Relation Age of Onset   Kidney failure Mother    Breast cancer Paternal Grandmother     Review of Systems: ROS Denies any recent chest pain, difficulty breathing, leg swelling, fevers.  Physical Exam: Vital Signs Pulse 69   Ht 5' 5 (1.651 m)   Wt 220 lb 9.6 oz (100.1 kg)   SpO2 100%   BMI 36.71 kg/m   Physical Exam Constitutional:      General: Not in acute distress.    Appearance: Normal appearance. Not ill-appearing.  HENT:     Head: Normocephalic and atraumatic.  Eyes:     Pupils: Pupils are equal, round. Cardiovascular:     Rate and Rhythm: Normal rate.    Pulses: Normal pulses.  Pulmonary:     Effort: No respiratory distress or increased work of breathing.  Speaks in full sentences. Abdominal:     General: Abdomen is flat. No distension.   Musculoskeletal: Normal range of motion. No lower extremity swelling or edema. No varicosities. Skin:    General: Skin is warm and dry.     Findings: No erythema or rash.  Neurological:     Mental Status: Alert and  oriented to person, place, and time.  Psychiatric:        Mood and Affect: Mood normal.        Behavior: Behavior normal.    Assessment/Plan: The patient is scheduled for liposuction of bilateral flanks with fat grafting to left breast for improved symmetr with Dr. Waddell.  Risks, benefits, and alternatives of procedure discussed, questions answered and consent obtained.    Smoking Status: Denies use of any nicotine containing products.  Last Mammogram: 06/04/2023; Results: BI-RADS Category 1: Negative.  Caprini Score: 4; Risk Factors include: Age, BMI greater than 25, and length of planned surgery. Recommendation for mechanical prophylaxis. Encourage early ambulation.   Pictures obtained: Today.  Post-op Rx sent to pharmacy: Oxycodone  and Zofran .  Patient was provided with the General Surgical Risk consent document and Pain Medication Agreement prior to their appointment.  They had adequate time to read through the risk consent documents and Pain Medication Agreement. We also discussed them in person together during this preop appointment. All of their questions were answered to their satisfaction.  Recommended calling if they have any further questions.  Risk consent form and Pain Medication Agreement to be scanned into patient's chart.  The risks that can be encountered with and after liposuction were discussed and include the following but no limited to these:  Asymmetry, fluid accumulation, firmness of the area, fat necrosis with death of fat tissue, bleeding, infection, delayed healing, anesthesia risks, skin sensation changes, injury to structures including nerves, blood vessels, and muscles which may be temporary or permanent, allergies to tape, suture materials and glues, blood products, topical preparations or injected agents, skin and contour irregularities, skin discoloration and swelling, deep vein thrombosis, cardiac and pulmonary complications, pain, which may persist, persistent  pain, recurrence of the lesion, poor healing of the incision, possible need for revisional surgery or staged procedures. Thiere can also be persistent swelling, poor wound healing, rippling or loose skin, worsening of cellulite, swelling, and thermal burn or heat injury from ultrasound with the ultrasound-assisted lipoplasty technique. Any change in weight fluctuations can alter the outcome.    Electronically signed by: Honora  Landy, PA-C 06/06/2024 9:33 AM

## 2024-06-06 NOTE — Progress Notes (Signed)
 Patient ID: Gail Brewer, female    DOB: Oct 29, 1975, 48 y.o.   MRN: [id number]  Chief Complaint  Patient presents with   Pre-op Exam           ICD-10-CM   1. Breast asymmetry  N64.89     2. S/P bilateral breast reduction  Z98.890     3. S/P breast reconstruction, left  Z98.890        History of Present Illness: Gail Brewer is a 48 y.o.  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  She presents for preoperative evaluation for upcoming procedure, liposuction of bilateral flanks with fat grafting to left breast for improved symmetry, scheduled for 06/24/2024 with Dr. Waddell.  The patient has not had problems with anesthesia.  She denies any personal or family history of blood clots or clotting disorder.  She denies any personal history of cancer, cardiac or pulmonary disease, nicotine use, or varicosities.  She states that she was recently started on hydrochlorothiazide for high blood pressure, but admits that she does not take it regularly.  She will hold any vitamins and supplements 1 week prior to surgery.  She will also hold any NSAIDs 3 days prior to surgery.  She understands that she will be due for a screening mammogram at the end of this month and will call to try and have it scheduled last week in November, prior to surgery.  If this is not obtained in advance of surgery, may still proceed per Dr. Waddell.  However, she would need to hold off on moving forward with mammogram for at least 3 months from time of surgery.  Summary of Previous Visit: Patient was seen by Dr. Waddell postoperatively after her most recent surgery 02/12/2024.  At that time, the fat grafting did not yield the results that were anticipated.  Discussed additional liposuction and fat grafting.  Discussed risks of surgery including but not limited to bleeding, infection, seroma, and persistent asymmetry.  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   Past Medical History: Allergies: Allergies  Allergen Reactions   Flagyl  [Metronidazole ] Other (See Comments)    Yeast infection    Current Medications:  Current Outpatient Medications:    ibuprofen  (ADVIL ) 200 MG tablet, Take 200 mg by mouth every 6 (six) hours as needed., Disp: , Rfl:    Multiple Vitamin (MULTIVITAMIN) capsule, Take 1 capsule by mouth daily., Disp: , Rfl:    ondansetron  (ZOFRAN -ODT) 4 MG disintegrating tablet, Take 1 tablet (4 mg total) by mouth every 8 (eight) hours as needed for nausea or vomiting., Disp: 20 tablet, Rfl: 0   oxyCODONE  (ROXICODONE ) 5 MG immediate release tablet, Take 1 tablet (5 mg total) by mouth every 8 (eight) hours as needed for up to 5 days for severe pain (pain score 7-10)., Disp: 15 tablet, Rfl: 0   cloNIDine  (CATAPRES ) 0.1 MG tablet, TAKE 1 TABLET(0.1 MG) BY MOUTH TWICE DAILY AS NEEDED (Patient not taking: Reported on 06/06/2024), Disp: 60 tablet, Rfl: 2  Past Medical Problems: Past Medical History:  Diagnosis Date   Anemia    Anxiety    COVID 07/2021   mild   Depression    Encounter for screening for other metabolic disorders 12/09/2022   Heart murmur    asymptomatic   Open wound of left breast 08/07/2022    Past  Surgical History: Past Surgical History:  Procedure Laterality Date   APPLICATION OF WOUND VAC Left 08/14/2022   Procedure: APPLICATION OF WOUND VAC;  Surgeon: Waddell Leonce NOVAK, MD;  Location: [facility];  Service: Plastics;  Laterality: Left;   APPLICATION OF WOUND VAC Left 08/18/2022   Procedure: APPLICATION OF WOUND VAC;  Surgeon: Waddell Leonce NOVAK, MD;  Location: [facility];  Service: Plastics;  Laterality: Left;   BREAST IMPLANT REMOVAL Left 07/22/2022   Procedure: DEBRIDEMENT LEFT BREAST;  Surgeon: Arelia Filippo, MD;  Location: [facility];  Service: Plastics;  Laterality: Left;    BREAST REDUCTION SURGERY Bilateral 07/11/2022   Procedure: MAMMARY REDUCTION  (BREAST);  Surgeon: Arelia Filippo, MD;  Location: [facility];  Service: Plastics;  Laterality: Bilateral;   CESAREAN SECTION     x 2   CESAREAN SECTION  07/24/2011   Procedure: CESAREAN SECTION;  Surgeon: Dickey FORBES Pines, MD;  Location: [facility];  Service: Gynecology;  Laterality: N/A;  Repeat Cesarian Section   IRRIGATION AND DEBRIDEMENT OF WOUND WITH SPLIT THICKNESS SKIN GRAFT Left 08/14/2022   Procedure: Washout and placement of wound vac;  Surgeon: Waddell Leonce NOVAK, MD;  Location: [facility];  Service: Plastics;  Laterality: Left;   LIPOSUCTION WITH LIPOFILLING Left 02/12/2024   Procedure: LIPOSUCTION WITH FAT TRANSFER TO LEFT BREAST;  Surgeon: Waddell Leonce NOVAK, MD;  Location: [facility];  Service: Plastics;  Laterality: Left;  Fat grafting to the left breast   PRIMARY CLOSURE Left 10/03/2022   Procedure: lifting skin flaps on left breast for reapproximation and primary closure;  Surgeon: Waddell Leonce NOVAK, MD;  Location: [facility];  Service: Plastics;  Laterality: Left;   REDUCTION MAMMAPLASTY     RESECTION OF ABDOMINAL MASS Right 11/20/2021   Procedure: EXCISION RIGHT LOWER ABDOMINAL WALL MASS;  Surgeon: Vernetta Berg, MD;  [facility];  Service: General;  Laterality: Right;    Social History: Social History   Socioeconomic History   Marital status: Divorced    Spouse name: Not on file   Number of children: Not on file   Years of education: Not on file   Highest education level: Not on file  Occupational History   Not on file  Tobacco Use   Smoking status: Former    Types: Cigars   Smokeless tobacco: Never  Vaping Use   Vaping status: Never Used  Substance and Sexual Activity   Alcohol use: Yes    Alcohol/week: 1.0 standard drink of alcohol    Types: 1 Standard drinks or equivalent per week   Drug use: No   Sexual  activity: Not Currently    Birth control/protection: None  Other Topics Concern   Not on file  Social History Narrative   Not on file   Social Drivers of Health   Financial Resource Strain: Not on file  Food Insecurity: No Food Insecurity (08/18/2022)   Hunger Vital Sign    Worried About Running Out of Food in the Last Year: Never true    Ran Out of Food in the Last Year: Never true  Transportation Needs: No Transportation Needs (08/18/2022)   PRAPARE - Administrator, Civil Service (Medical): No    Lack of Transportation (Non-Medical): No  Physical Activity: Not on file  Stress: Not on file  Social Connections: Not on file  Intimate Partner Violence: Unknown (08/18/2022)   Humiliation, Afraid, Rape, and Kick questionnaire    Fear of  Current or Ex-Partner: No    Emotionally Abused: No    Physically Abused: No    Sexually Abused: Not on file    Family History: Family History  Problem Relation Age of Onset   Kidney failure Mother    Breast cancer Paternal Grandmother     Review of Systems: ROS Denies any recent chest pain, difficulty breathing, leg swelling, fevers.  Physical Exam: Vital Signs Pulse 69   Ht 5' 5 (1.651 m)   Wt 220 lb 9.6 oz (100.1 kg)   SpO2 100%   BMI 36.71 kg/m   Physical Exam Constitutional:      General: Not in acute distress.    Appearance: Normal appearance. Not ill-appearing.  HENT:     Head: Normocephalic and atraumatic.  Eyes:     Pupils: Pupils are equal, round. Cardiovascular:     Rate and Rhythm: Normal rate.    Pulses: Normal pulses.  Pulmonary:     Effort: No respiratory distress or increased work of breathing.  Speaks in full sentences. Abdominal:     General: Abdomen is flat. No distension.   Musculoskeletal: Normal range of motion. No lower extremity swelling or edema. No varicosities. Skin:    General: Skin is warm and dry.     Findings: No erythema or rash.  Neurological:     Mental Status: Alert and  oriented to person, place, and time.  Psychiatric:        Mood and Affect: Mood normal.        Behavior: Behavior normal.    Assessment/Plan: The patient is scheduled for liposuction of bilateral flanks with fat grafting to left breast for improved symmetr with Dr. Waddell.  Risks, benefits, and alternatives of procedure discussed, questions answered and consent obtained.    Smoking Status: Denies use of any nicotine containing products.  Last Mammogram: 06/04/2023; Results: BI-RADS Category 1: Negative.  Caprini Score: 4; Risk Factors include: Age, BMI greater than 25, and length of planned surgery. Recommendation for mechanical prophylaxis. Encourage early ambulation.   Pictures obtained: Today.  Post-op Rx sent to pharmacy: Oxycodone  and Zofran .  Patient was provided with the General Surgical Risk consent document and Pain Medication Agreement prior to their appointment.  They had adequate time to read through the risk consent documents and Pain Medication Agreement. We also discussed them in person together during this preop appointment. All of their questions were answered to their satisfaction.  Recommended calling if they have any further questions.  Risk consent form and Pain Medication Agreement to be scanned into patient's chart.  The risks that can be encountered with and after liposuction were discussed and include the following but no limited to these:  Asymmetry, fluid accumulation, firmness of the area, fat necrosis with death of fat tissue, bleeding, infection, delayed healing, anesthesia risks, skin sensation changes, injury to structures including nerves, blood vessels, and muscles which may be temporary or permanent, allergies to tape, suture materials and glues, blood products, topical preparations or injected agents, skin and contour irregularities, skin discoloration and swelling, deep vein thrombosis, cardiac and pulmonary complications, pain, which may persist, persistent  pain, recurrence of the lesion, poor healing of the incision, possible need for revisional surgery or staged procedures. Thiere can also be persistent swelling, poor wound healing, rippling or loose skin, worsening of cellulite, swelling, and thermal burn or heat injury from ultrasound with the ultrasound-assisted lipoplasty technique. Any change in weight fluctuations can alter the outcome.    Electronically signed by: Honora  Landy, PA-C 06/06/2024 9:33 AM

## 2024-06-08 ENCOUNTER — Encounter: Admitting: Physician Assistant

## 2024-06-08 ENCOUNTER — Ambulatory Visit
Admission: RE | Admit: 2024-06-08 | Discharge: 2024-06-08 | Disposition: A | Discharge status: Home / Self Care | Source: Ambulatory Visit | Attending: Plastic Surgery | Admitting: Plastic Surgery

## 2024-06-08 ENCOUNTER — Other Ambulatory Visit: Payer: Self-pay | Admitting: Plastic Surgery

## 2024-06-08 DIAGNOSIS — Z1231 Encounter for screening mammogram for malignant neoplasm of breast: Secondary | ICD-10-CM

## 2024-06-14 ENCOUNTER — Encounter (HOSPITAL_BASED_OUTPATIENT_CLINIC_OR_DEPARTMENT_OTHER): Payer: Self-pay | Admitting: Plastic Surgery

## 2024-06-14 ENCOUNTER — Other Ambulatory Visit: Payer: Self-pay

## 2024-06-24 ENCOUNTER — Other Ambulatory Visit: Payer: Self-pay

## 2024-06-24 ENCOUNTER — Ambulatory Visit (HOSPITAL_BASED_OUTPATIENT_CLINIC_OR_DEPARTMENT_OTHER)
Admission: RE | Admit: 2024-06-24 | Discharge: 2024-06-24 | Disposition: A | Attending: Plastic Surgery | Admitting: Plastic Surgery

## 2024-06-24 ENCOUNTER — Ambulatory Visit (HOSPITAL_BASED_OUTPATIENT_CLINIC_OR_DEPARTMENT_OTHER): Admitting: Anesthesiology

## 2024-06-24 ENCOUNTER — Encounter (HOSPITAL_BASED_OUTPATIENT_CLINIC_OR_DEPARTMENT_OTHER): Admission: RE | Disposition: A | Payer: Self-pay | Source: Home / Self Care | Attending: Plastic Surgery

## 2024-06-24 ENCOUNTER — Encounter (HOSPITAL_BASED_OUTPATIENT_CLINIC_OR_DEPARTMENT_OTHER): Payer: Self-pay | Admitting: Plastic Surgery

## 2024-06-24 HISTORY — PX: REVISION, RECONSTRUCTION, BREAST: SHX7641

## 2024-06-24 HISTORY — PX: LIPOSUCTION WITH LIPOFILLING: SHX6436

## 2024-06-24 LAB — POCT PREGNANCY, URINE: Preg Test, Ur: NEGATIVE

## 2024-06-24 SURGERY — REVISION, RECONSTRUCTION, BREAST
Anesthesia: General | Site: Breast | Laterality: Left

## 2024-06-24 MED ORDER — LIDOCAINE 2% (20 MG/ML) 5 ML SYRINGE
INTRAMUSCULAR | Status: DC | PRN
  Administered 2024-06-24: 60 mg via INTRAVENOUS

## 2024-06-24 MED ORDER — LIDOCAINE HCL 1 % IJ SOLN
INTRAVENOUS | Status: DC | PRN
  Administered 2024-06-24: 1000 mL

## 2024-06-24 MED ORDER — GLYCOPYRROLATE PF 0.2 MG/ML IJ SOSY
PREFILLED_SYRINGE | INTRAMUSCULAR | Status: DC | PRN
  Administered 2024-06-24: .2 mg via INTRAVENOUS

## 2024-06-24 MED ORDER — AMISULPRIDE (ANTIEMETIC) 5 MG/2ML IV SOLN: 10.0000 mg | Freq: Once | INTRAVENOUS | Status: DC | PRN

## 2024-06-24 MED ORDER — GLYCOPYRROLATE PF 0.2 MG/ML IJ SOSY
PREFILLED_SYRINGE | INTRAMUSCULAR | Status: AC
  Filled 2024-06-24: qty 1

## 2024-06-24 MED ORDER — FENTANYL CITRATE (PF) 100 MCG/2ML IJ SOLN
INTRAMUSCULAR | Status: DC | PRN
  Administered 2024-06-24 (×2): 50 ug via INTRAVENOUS

## 2024-06-24 MED ORDER — LACTATED RINGERS IV SOLN: INTRAVENOUS | Status: DC | PRN

## 2024-06-24 MED ORDER — MIDAZOLAM HCL 2 MG/2ML IJ SOLN
INTRAMUSCULAR | Status: AC
  Filled 2024-06-24: qty 2

## 2024-06-24 MED ORDER — ONDANSETRON HCL 4 MG/2ML IJ SOLN
INTRAMUSCULAR | Status: AC
  Filled 2024-06-24: qty 2

## 2024-06-24 MED ORDER — HYDROMORPHONE HCL 1 MG/ML IJ SOLN: 0.2500 mg | INTRAMUSCULAR | Status: DC | PRN

## 2024-06-24 MED ORDER — 0.9 % SODIUM CHLORIDE (POUR BTL) OPTIME
TOPICAL | Status: DC | PRN
  Administered 2024-06-24: 1000 mL

## 2024-06-24 MED ORDER — CHLORHEXIDINE GLUCONATE CLOTH 2 % EX PADS: 6.0000 | MEDICATED_PAD | Freq: Once | CUTANEOUS | Status: DC

## 2024-06-24 MED ORDER — MIDAZOLAM HCL 5 MG/5ML IJ SOLN
INTRAMUSCULAR | Status: DC | PRN
  Administered 2024-06-24: 2 mg via INTRAVENOUS

## 2024-06-24 MED ORDER — FENTANYL CITRATE (PF) 100 MCG/2ML IJ SOLN
INTRAMUSCULAR | Status: AC
  Filled 2024-06-24: qty 2

## 2024-06-24 MED ORDER — DEXAMETHASONE SOD PHOSPHATE PF 10 MG/ML IJ SOLN
INTRAMUSCULAR | Status: DC | PRN
  Administered 2024-06-24: 10 mg via INTRAVENOUS

## 2024-06-24 MED ORDER — CEFAZOLIN SODIUM-DEXTROSE 2-4 GM/100ML-% IV SOLN
2.0000 g | INTRAVENOUS | Status: AC
  Administered 2024-06-24: 2 g via INTRAVENOUS

## 2024-06-24 MED ORDER — MEPERIDINE HCL 25 MG/ML IJ SOLN: 6.2500 mg | INTRAMUSCULAR | Status: DC | PRN

## 2024-06-24 MED ORDER — LACTATED RINGERS IV SOLN: INTRAVENOUS | Status: DC

## 2024-06-24 MED ORDER — LIDOCAINE 2% (20 MG/ML) 5 ML SYRINGE
INTRAMUSCULAR | Status: AC
  Filled 2024-06-24: qty 5

## 2024-06-24 MED ORDER — ONDANSETRON HCL 4 MG/2ML IJ SOLN
INTRAMUSCULAR | Status: DC | PRN
  Administered 2024-06-24: 4 mg via INTRAVENOUS

## 2024-06-24 MED ORDER — LACTATED RINGERS IV SOLN
INTRAVENOUS | Status: AC | PRN
  Administered 2024-06-24: 1000 mL

## 2024-06-24 MED ORDER — PROPOFOL 500 MG/50ML IV EMUL
INTRAVENOUS | Status: DC | PRN
  Administered 2024-06-24: 150 ug/kg/min via INTRAVENOUS

## 2024-06-24 MED ORDER — ACETAMINOPHEN 10 MG/ML IV SOLN
INTRAVENOUS | Status: DC | PRN
  Administered 2024-06-24: 1000 mg via INTRAVENOUS

## 2024-06-24 MED ORDER — OXYCODONE HCL 5 MG PO TABS: 5.0000 mg | ORAL_TABLET | Freq: Once | ORAL | Status: DC | PRN

## 2024-06-24 MED ORDER — OXYCODONE HCL 5 MG/5ML PO SOLN: 5.0000 mg | Freq: Once | ORAL | Status: DC | PRN

## 2024-06-24 MED ORDER — CEFAZOLIN SODIUM-DEXTROSE 2-4 GM/100ML-% IV SOLN
INTRAVENOUS | Status: AC
  Filled 2024-06-24: qty 100

## 2024-06-24 MED ORDER — PROPOFOL 10 MG/ML IV BOLUS
INTRAVENOUS | Status: DC | PRN
  Administered 2024-06-24: 100 mg via INTRAVENOUS
  Administered 2024-06-24: 50 mg via INTRAVENOUS

## 2024-06-24 MED ORDER — HYDROMORPHONE HCL 1 MG/ML IJ SOLN
INTRAMUSCULAR | Status: DC | PRN
  Administered 2024-06-24: .5 mg via INTRAVENOUS

## 2024-06-24 MED ORDER — HYDROMORPHONE HCL 1 MG/ML IJ SOLN
INTRAMUSCULAR | Status: AC
  Filled 2024-06-24: qty 0.5

## 2024-06-24 SURGICAL SUPPLY — 56 items
BINDER ABD UNIV 12 30-45 (MISCELLANEOUS) IMPLANT
BINDER ABD UNIV 12 45-62 (WOUND CARE) IMPLANT
BINDER ABDOMINAL 10 UNV 27-48 (MISCELLANEOUS) IMPLANT
BINDER ABDOMINAL 12 SM 30-45 (SOFTGOODS) IMPLANT
BINDER BREAST LRG (GAUZE/BANDAGES/DRESSINGS) IMPLANT
BINDER BREAST XLRG (GAUZE/BANDAGES/DRESSINGS) IMPLANT
BINDER BREAST XXLRG (GAUZE/BANDAGES/DRESSINGS) IMPLANT
BLADE SURG 10 STRL SS (BLADE) IMPLANT
BLADE SURG 11 STRL SS (BLADE) ×2 IMPLANT
BLADE SURG 15 STRL LF DISP TIS (BLADE) ×2 IMPLANT
CANISTER SUCT 1200ML W/VALVE (MISCELLANEOUS) IMPLANT
DERMABOND ADVANCED .7 DNX12 (GAUZE/BANDAGES/DRESSINGS) ×2 IMPLANT
DRAPE UTILITY XL STRL (DRAPES) ×2 IMPLANT
DRSG MEPILEX POST OP 4X8 (GAUZE/BANDAGES/DRESSINGS) ×4 IMPLANT
ELECT COATED BLADE 2.86 ST (ELECTRODE) IMPLANT
ELECTRODE REM PT RTRN 9FT ADLT (ELECTROSURGICAL) ×2 IMPLANT
EXTRACTOR CANIST REVOLVE STRL (CANNISTER) ×2 IMPLANT
GAUZE PAD ABD 8X10 STRL (GAUZE/BANDAGES/DRESSINGS) ×4 IMPLANT
GLOVE BIO SURGEON STRL SZ 6.5 (GLOVE) IMPLANT
GLOVE BIO SURGEON STRL SZ8 (GLOVE) ×2 IMPLANT
GLOVE BIOGEL PI IND STRL 7.0 (GLOVE) IMPLANT
GLOVE BIOGEL PI IND STRL 7.5 (GLOVE) IMPLANT
GLOVE BIOGEL PI IND STRL 8 (GLOVE) ×2 IMPLANT
GOWN STRL REUS W/ TWL LRG LVL3 (GOWN DISPOSABLE) IMPLANT
GOWN STRL REUS W/TWL XL LVL3 (GOWN DISPOSABLE) ×2 IMPLANT
HIBICLENS CHG 4% 4OZ BTL (MISCELLANEOUS) ×2 IMPLANT
LINER CANISTER 1000CC FLEX (MISCELLANEOUS) ×2 IMPLANT
NDL HYPO 25GX1X1/2 BEV (NEEDLE) ×2 IMPLANT
NDL SAFETY ECLIPSE 18X1.5 (NEEDLE) ×2 IMPLANT
PACK BASIN DAY SURGERY FS (CUSTOM PROCEDURE TRAY) ×2 IMPLANT
PACK UNIVERSAL I (CUSTOM PROCEDURE TRAY) ×2 IMPLANT
PAD ALCOHOL SWAB (MISCELLANEOUS) ×2 IMPLANT
PAD FOAM SILICONE BACKED (GAUZE/BANDAGES/DRESSINGS) ×2 IMPLANT
PENCIL SMOKE EVACUATOR (MISCELLANEOUS) IMPLANT
SLEEVE SCD COMPRESS KNEE MED (STOCKING) ×2 IMPLANT
SOLN 0.9% NACL POUR BTL 1000ML (IV SOLUTION) IMPLANT
SPIKE FLUID TRANSFER (MISCELLANEOUS) IMPLANT
SPONGE T-LAP 18X18 ~~LOC~~+RFID (SPONGE) ×2 IMPLANT
STAPLER SKIN PROX WIDE 3.9 (STAPLE) ×2 IMPLANT
SUT CHROMIC 5 0 RB 1 27 (SUTURE) IMPLANT
SUT MNCRL AB 3-0 PS2 18 (SUTURE) IMPLANT
SUT MNCRL AB 4-0 PS2 18 (SUTURE) ×2 IMPLANT
SUT VIC AB 3-0 SH 27X BRD (SUTURE) IMPLANT
SYR 10ML LL (SYRINGE) ×10 IMPLANT
SYR 50ML LL SCALE MARK (SYRINGE) IMPLANT
SYR BULB IRRIG 60ML STRL (SYRINGE) IMPLANT
SYR CONTROL 10ML LL (SYRINGE) IMPLANT
SYR TB 1ML LL NO SAFETY (SYRINGE) IMPLANT
SYRINGE TOOMEY IRRIG 70ML (MISCELLANEOUS) IMPLANT
TOWEL GREEN STERILE FF (TOWEL DISPOSABLE) ×4 IMPLANT
TRAY DSU PREP LF (CUSTOM PROCEDURE TRAY) ×2 IMPLANT
TUBE CONNECTING 20X1/4 (TUBING) IMPLANT
TUBING INFILTRATION IT-10001 (TUBING) ×2 IMPLANT
TUBING SET GRADUATE ASPIR 12FT (MISCELLANEOUS) ×2 IMPLANT
UNDERPAD 30X36 HEAVY ABSORB (UNDERPADS AND DIAPERS) ×4 IMPLANT
YANKAUER SUCT BULB TIP NO VENT (SUCTIONS) IMPLANT

## 2024-06-24 NOTE — Anesthesia Preprocedure Evaluation (Addendum)
 Anesthesia Evaluation  Patient identified by MRN, date of birth, ID band Patient awake    Reviewed: Allergy & Precautions, H&P , NPO status , Patient's Chart, lab work & pertinent test results  History of Anesthesia Complications Negative for: history of anesthetic complications  Airway Mallampati: II  TM Distance: >3 FB Neck ROM: Full   Comment: Previous grade I view with MAC 3, easy mask Dental  (+) Dental Advisory Given   Pulmonary neg shortness of breath, neg sleep apnea, neg COPD, neg recent URI, former smoker   Pulmonary exam normal breath sounds clear to auscultation       Cardiovascular (-) hypertension(-) angina (-) Past MI, (-) Cardiac Stents and (-) CABG negative cardio ROS (-) dysrhythmias  Rhythm:Regular Rate:Normal     Neuro/Psych   Anxiety Depression    negative neurological ROS  negative psych ROS   GI/Hepatic negative GI ROS, Neg liver ROS,,,  Endo/Other  negative endocrine ROS    Renal/GU negative Renal ROS  negative genitourinary   Musculoskeletal negative musculoskeletal ROS (+)    Abdominal  (+) + obese  Peds negative pediatric ROS (+)  Hematology negative hematology ROS (+)   Anesthesia Other Findings   Reproductive/Obstetrics negative OB ROS                              Anesthesia Physical Anesthesia Plan  ASA: 2  Anesthesia Plan: General   Post-op Pain Management: Tylenol  PO (pre-op)*   Induction: Intravenous  PONV Risk Score and Plan: 3 and Ondansetron , Dexamethasone , Midazolam  and Treatment may vary due to age or medical condition  Airway Management Planned: Oral ETT and LMA  Additional Equipment:   Intra-op Plan:   Post-operative Plan: Extubation in OR  Informed Consent: I have reviewed the patients History and Physical, chart, labs and discussed the procedure including the risks, benefits and alternatives for the proposed anesthesia with the  patient or authorized representative who has indicated his/her understanding and acceptance.     Dental advisory given  Plan Discussed with: CRNA and Anesthesiologist  Anesthesia Plan Comments: (Risks of general anesthesia discussed including, but not limited to, sore throat, hoarse voice, chipped/damaged teeth, injury to vocal cords, nausea and vomiting, allergic reactions, lung infection, heart attack, stroke, and death. All questions answered. )         Anesthesia Quick Evaluation

## 2024-06-24 NOTE — Discharge Instructions (Signed)
 No Tylenol  until 9:40pm if needed.

## 2024-06-24 NOTE — Interval H&P Note (Signed)
 History and Physical Interval Note: No change in exam or indication for surgery All questions answered. Donor and recipient sites marked with her concurrence Will proceed at her request  06/24/2024 1:34 PM  Gail Brewer  has presented today for surgery, with the diagnosis of n64.  The various methods of treatment have been discussed with the patient and family. After consideration of risks, benefits and other options for treatment, the patient has consented to  Procedure(s) with comments: REVISION, RECONSTRUCTION, BREAST (Left) LIPOSUCTION, WITH FAT TRANSFER (Left) - Fat grafting to left breast as a surgical intervention.  The patient's history has been reviewed, patient examined, no change in status, stable for surgery.  I have reviewed the patient's chart and labs.  Questions were answered to the patient's satisfaction.     Leonce KATHEE Birmingham

## 2024-06-24 NOTE — Anesthesia Procedure Notes (Signed)
 Procedure Name: LMA Insertion Date/Time: 06/24/2024 2:22 PM  Performed by: Denton Niels CROME, CRNAPre-anesthesia Checklist: Patient identified, Emergency Drugs available, Suction available, Patient being monitored and Timeout performed Patient Re-evaluated:Patient Re-evaluated prior to induction Oxygen Delivery Method: Circle system utilized Preoxygenation: Pre-oxygenation with 100% oxygen Induction Type: IV induction Ventilation: Mask ventilation without difficulty LMA: LMA with gastric port inserted LMA Size: 3.0 Number of attempts: 1 Placement Confirmation: positive ETCO2 Dental Injury: Teeth and Oropharynx as per pre-operative assessment

## 2024-06-24 NOTE — Transfer of Care (Signed)
 Immediate Anesthesia Transfer of Care Note  Patient: Gail Brewer  Procedure(s) Performed: REVISION, RECONSTRUCTION, BREAST (Left: Breast) LIPOSUCTION, WITH FAT TRANSFER (Left: Abdomen)  Patient Location: PACU  Anesthesia Type:General  Level of Consciousness: awake, alert , oriented, and patient cooperative  Airway & Oxygen Therapy: Patient Spontanous Breathing and Patient connected to face mask oxygen  Post-op Assessment: Report given to RN and Post -op Vital signs reviewed and stable  Post vital signs: Reviewed and stable  Last Vitals:  Vitals Value Taken Time  BP 103/80 06/24/24 16:00  Temp    Pulse 59 06/24/24 16:04  Resp 12 06/24/24 16:04  SpO2 100 % 06/24/24 16:04  Vitals shown include unfiled device data.  Last Pain:  Vitals:   06/24/24 1121  TempSrc: Temporal  PainSc: 0-No pain      Patients Stated Pain Goal: 5 (06/24/24 1121)  Complications: No notable events documented.

## 2024-06-24 NOTE — Op Note (Signed)
 DATE OF OPERATION: 06/24/2024  LOCATION: [facility] operating Room  PREOPERATIVE DIAGNOSIS: Volume loss after breast reduction, left breast  POSTOPERATIVE DIAGNOSIS: Same  PROCEDURE: Fat grafting to the left breast  SURGEON: Marinell Birmingham, MD  ASSISTANT: Honora Seip  EBL: 20 cc  CONDITION: Stable  COMPLICATIONS: None  INDICATION: The patient, Gail Brewer, is a 48 y.o. female born on 1976-07-05, is here for treatment patient underwent a complicated breast reduction and unfortunately sustained a loss of the left nipple as well as loss of a portion of the pedicle to the nipple areolar complex.  She has been undergoing reconstructive procedures since that time.  She presents today for fat grafting to improve the volume of the lower pole of the left breast..   PROCEDURE DETAILS:  The patient was seen prior to surgery and marked.  The IV antibiotics were given. The patient was taken to the operating room and given a general anesthetic. A standard time out was performed and all information was confirmed by those in the room. SCDs were placed.   The chest, abdomen, flanks were prepped and draped in usual sterile manner.  The flanks and the anterior portion of the abdominal wall were infiltrated with tumescent fluid containing lidocaine  and epinephrine .  After sufficient dwell time 4 mm cannulas were to harvest fat from the bilateral flanks and from the anterior abdominal wall.  Approximately 250 mL of lipo aspirate were removed via liposuction.  This was processed and approximately 100 mL of processed adipose tissue was obtained.  This was then injected into the lower pole of the left breast.  The contour was quite nice however she probably still has some volume difference between the 2 breasts.  This will need to be evaluated after resorption of the nonvascularized fat and with the patient in the standing position.  The liposuction sites in the fat grafting sites were closed with interrupted  4-0 Monocryl sutures.  The grafted sites were sealed with Dermabond.  TopiFoam and ABD pads were placed over the liposuction sites and liposuction introduction ports.  The patient was awakened from anesthesia put in a compressive garment and transferred to the recovery room in good condition.  All instrument needle and sponge counts reported as correct and no complications were appreciated during the procedure. The patient was allowed to wake up and taken to recovery room in stable condition at the end of the case. The family was notified at the end of the case.   The advanced practice practitioner (APP) assisted throughout the case.  The APP was essential in retraction and counter traction when needed to make the case progress smoothly.  This retraction and assistance made it possible to see the tissue plans for the procedure.  The assistance was needed for blood control, tissue re-approximation and assisted with closure of the incision site.

## 2024-06-25 NOTE — Anesthesia Postprocedure Evaluation (Signed)
 Anesthesia Post Note  Patient: Gail Brewer  Procedure(s) Performed: REVISION, RECONSTRUCTION, BREAST (Left: Breast) LIPOSUCTION, WITH FAT TRANSFER (Left: Abdomen)     Patient location during evaluation: PACU Anesthesia Type: General Level of consciousness: awake Pain management: pain level controlled Vital Signs Assessment: post-procedure vital signs reviewed and stable Respiratory status: spontaneous breathing, nonlabored ventilation and respiratory function stable Cardiovascular status: blood pressure returned to baseline and stable Postop Assessment: no apparent nausea or vomiting Anesthetic complications: no   No notable events documented.  Last Vitals:  Vitals:   06/24/24 1615 06/24/24 1630  BP: (!) 120/56 124/62  Pulse: 65 68  Resp: 19 16  Temp:  (!) 36.4 C  SpO2: 99% 95%    Last Pain:  Vitals:   06/24/24 1630  TempSrc:   PainSc: 1                  Ryver Poblete P Rane Dumm

## 2024-06-26 ENCOUNTER — Encounter (HOSPITAL_BASED_OUTPATIENT_CLINIC_OR_DEPARTMENT_OTHER): Payer: Self-pay | Admitting: Plastic Surgery

## 2024-06-30 ENCOUNTER — Encounter: Admitting: Plastic Surgery

## 2024-07-01 ENCOUNTER — Ambulatory Visit: Admitting: Physician Assistant

## 2024-07-01 DIAGNOSIS — N6489 Other specified disorders of breast: Secondary | ICD-10-CM

## 2024-07-01 DIAGNOSIS — T8131XS Disruption of external operation (surgical) wound, not elsewhere classified, sequela: Secondary | ICD-10-CM

## 2024-07-01 NOTE — Progress Notes (Cosign Needed)
 Patient is a 48 y.o.  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  She recently underwent revision of her left breast with Dr. Waddell on 06/24/2024.  Intraoperatively, approximately 100 mL of adipose tissue were injected into the lower pole of the left breast.  Patient is 1 week postop.  She presents to the clinic today for postoperative follow-up.  Today,

## 2024-07-01 NOTE — Progress Notes (Signed)
 Patient is a 48 year old female with history of breast reduction complicated by left nipple areolar necrosis requiring debridements who is now s/p abdominal liposuction with fat grafting to left breast performed 06/24/2024 by Dr. Waddell who presents to clinic for postoperative follow-up.    Reviewed operative report and approximately 100 mL of adipose tissue were injected into the lower pole of the left breast.   Today, patient is doing well.  She has noticed an improvement.  She denies any chest pain, difficulty breathing, leg swelling, or fevers.  She is tolerating p.o. intake without difficulty, voiding urine and BM.  Pain is controlled.  She even was able to go to an exercise class.  On exam, abdomen is soft and nondistended.  No palpable areas of firmness or underlying fluid collections.  No overlying ecchymoses.  No obvious seroma or hematoma.  Left breast exam is also benign.  She has numerous 1 cm stab incisions where liposuction was either harvested or placed through.  These are all approximated with simple interrupted Monocryl sutures.  Recommending that she return in 1 week for removal of any residual sutures.  Continue with activity modifications and compressive garments in the interim.  She is wearing a faja 24/7.  Patient to call the office should she have any questions or concerns in interim.

## 2024-07-05 ENCOUNTER — Ambulatory Visit: Admitting: Physician Assistant

## 2024-07-05 VITALS — BP 121/73 | HR 60 | Ht 60.0 in | Wt 217.0 lb

## 2024-07-05 DIAGNOSIS — N6489 Other specified disorders of breast: Secondary | ICD-10-CM

## 2024-07-05 NOTE — Progress Notes (Signed)
 Patient is a 48 year old female with history of breast reduction complicated by left nipple areolar necrosis requiring debridements who is now s/p abdominal liposuction with fat grafting to left breast performed 06/24/2024 by Dr. Waddell who presents to clinic for postoperative follow-up.   She was last seen here in clinic on 07/01/2024.  At that time, exam was benign and she was doing well from a postoperative standpoint.  Recommended continued activity modifications and compressive garments.  Follow-up 1 week for removal of any residual sutures.  Today, patient is doing well.  No specific Insems or complaints from a postoperative standpoint.  She has continued to wear her binder..  For removal of any residual sutures, although some have already started to fall off spontaneously.  Abdomen is soft and nondistended with no focal areas of swelling.  No seroma or hematoma.  Scattered interrupted Monocryl sutures are removed/trimmed.  No overlying skin changes.  Left breast does have some improvement in terms of size/symmetry.  Patient is doing excellent from postoperative standpoint.  Recommend continued ambulation and compressive garments x 1 week, but have low suspicion for her developing any seroma/hematoma afterwards.  She can begin to increase activity.  Recommending Vaseline application daily to the stab incisions for the next week.  Afterwards, she can transition to silicone scar gel twice daily x 3 months along with mechanical massage.  Follow-up in 4 weeks with Dr. Waddell, sooner if needed.

## 2024-07-13 ENCOUNTER — Encounter: Admitting: Student

## 2024-08-03 ENCOUNTER — Ambulatory Visit (INDEPENDENT_AMBULATORY_CARE_PROVIDER_SITE_OTHER): Admitting: Plastic Surgery

## 2024-08-03 VITALS — BP 125/83 | HR 72

## 2024-08-03 DIAGNOSIS — Z9889 Other specified postprocedural states: Secondary | ICD-10-CM

## 2024-08-03 NOTE — Progress Notes (Signed)
 Gail Brewer returns with a little over a month out after fat grafting to the left breast.  She has obvious improvement from when we began.  There is still some asymmetry and I believe it is reasonable to try 1 more round of fat grafting.  I have asked her to wait until she is a little further out from her last procedure.  She will continue scar massage to the scars.  She will return to see me at the end of January or beginning of February.

## 2024-08-18 ENCOUNTER — Ambulatory Visit: Admitting: Plastic Surgery

## 2024-08-18 VITALS — BP 123/80 | HR 81

## 2024-08-18 DIAGNOSIS — Z9889 Other specified postprocedural states: Secondary | ICD-10-CM

## 2024-08-18 DIAGNOSIS — N6489 Other specified disorders of breast: Secondary | ICD-10-CM

## 2024-08-18 NOTE — Progress Notes (Signed)
 Ms. Yokoyama returns today for evaluation and surgical scheduling.  She had previously undergone a bilateral breast reduction with loss of the left nipple and part of the volume of the left breast.  She has already undergone fat grafting to the left breast but still has some asymmetry.  I have told her that I thought it was reasonable to try 1 more time to improve the volume of the left breast.  On examination all incisions are well-healed and the scar on the left breast is soft.  There is approximately a 2 cm difference between the lower pole of the right breast and the left breast.  I will concentrate all of the fat this time on the left lower pole.  All questions were answered to her satisfaction.  Photographs were obtained today with her consent.  Will schedule her for repeat fat grafting to the left breast.  I spent 30 minutes reviewing the chart, examining the patient, discussing treatment, coordinating care and documenting.

## 2024-08-19 ENCOUNTER — Encounter: Admitting: Student
